# Patient Record
Sex: Male | Born: 1965 | Race: Black or African American | Hispanic: No | Smoking: Former smoker
Health system: Southern US, Community
[De-identification: ages and names within clinical notes are randomized; demographics above are authoritative.]

## PROBLEM LIST (undated history)

## (undated) DIAGNOSIS — I499 Cardiac arrhythmia, unspecified: Secondary | ICD-10-CM

## (undated) DIAGNOSIS — L709 Acne, unspecified: Secondary | ICD-10-CM

## (undated) DIAGNOSIS — H5440 Blindness, one eye, unspecified eye: Secondary | ICD-10-CM

## (undated) DIAGNOSIS — I209 Angina pectoris, unspecified: Secondary | ICD-10-CM

## (undated) DIAGNOSIS — F410 Panic disorder [episodic paroxysmal anxiety] without agoraphobia: Secondary | ICD-10-CM

## (undated) DIAGNOSIS — R634 Abnormal weight loss: Secondary | ICD-10-CM

## (undated) DIAGNOSIS — K219 Gastro-esophageal reflux disease without esophagitis: Secondary | ICD-10-CM

## (undated) DIAGNOSIS — K649 Unspecified hemorrhoids: Secondary | ICD-10-CM

## (undated) DIAGNOSIS — G255 Other chorea: Secondary | ICD-10-CM

## (undated) DIAGNOSIS — N529 Male erectile dysfunction, unspecified: Secondary | ICD-10-CM

## (undated) DIAGNOSIS — K5790 Diverticulosis of intestine, part unspecified, without perforation or abscess without bleeding: Secondary | ICD-10-CM

## (undated) DIAGNOSIS — I10 Essential (primary) hypertension: Secondary | ICD-10-CM

## (undated) DIAGNOSIS — G10 Huntington's disease: Secondary | ICD-10-CM

## (undated) HISTORY — DX: Gastro-esophageal reflux disease without esophagitis: K21.9

## (undated) HISTORY — DX: Acne, unspecified: L70.9

## (undated) HISTORY — PX: COLONOSCOPY: SHX174

## (undated) HISTORY — DX: Other chorea: G25.5

## (undated) HISTORY — DX: Unspecified hemorrhoids: K64.9

## (undated) HISTORY — DX: Diverticulosis of intestine, part unspecified, without perforation or abscess without bleeding: K57.90

## (undated) HISTORY — PX: CARDIAC SURGERY: SHX584

## (undated) HISTORY — PX: HEMORRHOID SURGERY: SHX153

## (undated) HISTORY — DX: Blindness, one eye, unspecified eye: H54.40

## (undated) HISTORY — DX: Abnormal weight loss: R63.4

## (undated) HISTORY — DX: Male erectile dysfunction, unspecified: N52.9

---

## 1989-11-04 HISTORY — PX: ROTATOR CUFF REPAIR: SHX139

## 2000-11-04 ENCOUNTER — Encounter: Payer: Self-pay | Admitting: Emergency Medicine

## 2000-11-04 ENCOUNTER — Emergency Department (HOSPITAL_COMMUNITY): Admission: EM | Admit: 2000-11-04 | Discharge: 2000-11-04 | Payer: Self-pay | Admitting: Emergency Medicine

## 2001-10-23 ENCOUNTER — Emergency Department (HOSPITAL_COMMUNITY): Admission: EM | Admit: 2001-10-23 | Discharge: 2001-10-23 | Payer: Self-pay | Admitting: Emergency Medicine

## 2001-11-06 ENCOUNTER — Emergency Department (HOSPITAL_COMMUNITY): Admission: EM | Admit: 2001-11-06 | Discharge: 2001-11-06 | Payer: Self-pay | Admitting: *Deleted

## 2002-04-27 ENCOUNTER — Emergency Department (HOSPITAL_COMMUNITY): Admission: EM | Admit: 2002-04-27 | Discharge: 2002-04-27 | Payer: Self-pay | Admitting: Emergency Medicine

## 2002-04-28 ENCOUNTER — Emergency Department (HOSPITAL_COMMUNITY): Admission: EM | Admit: 2002-04-28 | Discharge: 2002-04-28 | Payer: Self-pay | Admitting: Emergency Medicine

## 2002-04-30 ENCOUNTER — Emergency Department (HOSPITAL_COMMUNITY): Admission: EM | Admit: 2002-04-30 | Discharge: 2002-04-30 | Payer: Self-pay

## 2002-08-20 ENCOUNTER — Inpatient Hospital Stay (HOSPITAL_COMMUNITY): Admission: EM | Admit: 2002-08-20 | Discharge: 2002-08-27 | Payer: Self-pay | Admitting: Psychiatry

## 2002-08-30 ENCOUNTER — Other Ambulatory Visit (HOSPITAL_COMMUNITY): Admission: RE | Admit: 2002-08-30 | Discharge: 2002-08-31 | Payer: Self-pay | Admitting: Psychiatry

## 2002-09-03 ENCOUNTER — Emergency Department (HOSPITAL_COMMUNITY): Admission: EM | Admit: 2002-09-03 | Discharge: 2002-09-03 | Payer: Self-pay | Admitting: Emergency Medicine

## 2002-11-10 ENCOUNTER — Ambulatory Visit (HOSPITAL_COMMUNITY): Admission: RE | Admit: 2002-11-10 | Discharge: 2002-11-10 | Payer: Self-pay | Admitting: *Deleted

## 2003-02-28 ENCOUNTER — Emergency Department (HOSPITAL_COMMUNITY): Admission: EM | Admit: 2003-02-28 | Discharge: 2003-02-28 | Payer: Self-pay | Admitting: Emergency Medicine

## 2004-09-11 ENCOUNTER — Emergency Department: Payer: Self-pay | Admitting: Emergency Medicine

## 2004-09-12 ENCOUNTER — Emergency Department: Payer: Self-pay | Admitting: Emergency Medicine

## 2004-12-06 ENCOUNTER — Emergency Department: Payer: Self-pay | Admitting: Emergency Medicine

## 2005-01-29 ENCOUNTER — Emergency Department: Payer: Self-pay | Admitting: Emergency Medicine

## 2005-03-11 ENCOUNTER — Ambulatory Visit: Payer: Self-pay | Admitting: Gastroenterology

## 2005-05-10 ENCOUNTER — Ambulatory Visit: Payer: Self-pay | Admitting: Gastroenterology

## 2005-10-27 ENCOUNTER — Inpatient Hospital Stay: Payer: Self-pay | Admitting: Internal Medicine

## 2005-11-22 ENCOUNTER — Emergency Department: Payer: Self-pay | Admitting: Internal Medicine

## 2006-07-08 ENCOUNTER — Emergency Department: Payer: Self-pay | Admitting: Emergency Medicine

## 2006-07-27 ENCOUNTER — Emergency Department: Payer: Self-pay | Admitting: Emergency Medicine

## 2007-01-19 ENCOUNTER — Emergency Department: Payer: Self-pay | Admitting: Emergency Medicine

## 2007-09-09 ENCOUNTER — Ambulatory Visit: Payer: Self-pay | Admitting: Specialist

## 2007-10-24 ENCOUNTER — Emergency Department: Payer: Self-pay | Admitting: Unknown Physician Specialty

## 2008-01-06 ENCOUNTER — Observation Stay: Payer: Self-pay | Admitting: Internal Medicine

## 2008-01-06 ENCOUNTER — Other Ambulatory Visit: Payer: Self-pay

## 2008-07-03 ENCOUNTER — Emergency Department: Payer: Self-pay | Admitting: Internal Medicine

## 2008-07-20 ENCOUNTER — Emergency Department: Payer: Self-pay | Admitting: Emergency Medicine

## 2008-09-26 ENCOUNTER — Inpatient Hospital Stay: Payer: Self-pay | Admitting: Psychiatry

## 2008-12-01 ENCOUNTER — Emergency Department: Payer: Self-pay | Admitting: Emergency Medicine

## 2010-01-04 ENCOUNTER — Emergency Department: Payer: Self-pay | Admitting: Emergency Medicine

## 2010-01-08 ENCOUNTER — Emergency Department: Payer: Self-pay | Admitting: Emergency Medicine

## 2010-03-16 ENCOUNTER — Emergency Department: Payer: Self-pay | Admitting: Internal Medicine

## 2010-05-24 ENCOUNTER — Emergency Department: Payer: Self-pay | Admitting: Emergency Medicine

## 2010-06-30 ENCOUNTER — Emergency Department: Payer: Self-pay | Admitting: Emergency Medicine

## 2011-04-26 ENCOUNTER — Emergency Department: Payer: Self-pay | Admitting: Emergency Medicine

## 2011-11-24 ENCOUNTER — Emergency Department: Payer: Self-pay | Admitting: *Deleted

## 2012-02-02 ENCOUNTER — Emergency Department: Payer: Self-pay | Admitting: Emergency Medicine

## 2012-02-04 ENCOUNTER — Inpatient Hospital Stay: Payer: Self-pay | Admitting: Surgery

## 2012-02-04 LAB — COMPREHENSIVE METABOLIC PANEL
Anion Gap: 9 (ref 7–16)
Calcium, Total: 9.2 mg/dL (ref 8.5–10.1)
Chloride: 106 mmol/L (ref 98–107)
Co2: 25 mmol/L (ref 21–32)
Creatinine: 0.98 mg/dL (ref 0.60–1.30)
EGFR (African American): >60
Potassium: 4.3 mmol/L (ref 3.5–5.1)
Sodium: 140 mmol/L (ref 136–145)
Total Protein: 8.7 g/dL — ABNORMAL HIGH (ref 6.4–8.2)

## 2012-02-04 LAB — CBC
HCT: 45.1 % (ref 40.0–52.0)
HGB: 15.1 g/dL (ref 13.0–18.0)
MCHC: 33.6 g/dL (ref 32.0–36.0)
MCV: 85 fL (ref 80–100)
Platelet: 225 10*3/uL (ref 150–440)
RBC: 5.32 10*6/uL (ref 4.40–5.90)
RDW: 15.8 % — ABNORMAL HIGH (ref 11.5–14.5)

## 2012-02-05 LAB — CBC WITH DIFFERENTIAL/PLATELET
Basophil #: 0 10*3/uL (ref 0.0–0.1)
Basophil %: 0.3 %
Eosinophil #: 0.5 10*3/uL (ref 0.0–0.7)
MCH: 28.2 pg (ref 26.0–34.0)
MCHC: 33.5 g/dL (ref 32.0–36.0)
MCV: 84 fL (ref 80–100)
Neutrophil %: 64.9 %
Platelet: 229 10*3/uL (ref 150–440)
RBC: 5.47 10*6/uL (ref 4.40–5.90)
RDW: 15.3 % — ABNORMAL HIGH (ref 11.5–14.5)

## 2012-02-06 LAB — CLOSTRIDIUM DIFFICILE BY PCR

## 2012-02-07 LAB — PATHOLOGY REPORT

## 2012-02-08 LAB — STOOL CULTURE

## 2012-05-20 ENCOUNTER — Ambulatory Visit: Payer: Self-pay

## 2012-06-08 ENCOUNTER — Ambulatory Visit (INDEPENDENT_AMBULATORY_CARE_PROVIDER_SITE_OTHER): Payer: Self-pay

## 2012-06-08 DIAGNOSIS — K589 Irritable bowel syndrome without diarrhea: Secondary | ICD-10-CM

## 2012-07-04 ENCOUNTER — Emergency Department: Payer: Self-pay | Admitting: Emergency Medicine

## 2012-09-29 ENCOUNTER — Emergency Department: Payer: Self-pay | Admitting: Unknown Physician Specialty

## 2012-10-31 ENCOUNTER — Emergency Department: Payer: Self-pay | Admitting: Unknown Physician Specialty

## 2012-10-31 LAB — COMPREHENSIVE METABOLIC PANEL
Albumin: 3.5 g/dL (ref 3.4–5.0)
Alkaline Phosphatase: 59 U/L (ref 50–136)
Anion Gap: 5 — ABNORMAL LOW (ref 7–16)
BUN: 7 mg/dL (ref 7–18)
Bilirubin,Total: 0.6 mg/dL (ref 0.2–1.0)
Creatinine: 1 mg/dL (ref 0.60–1.30)
Glucose: 131 mg/dL — ABNORMAL HIGH (ref 65–99)
Osmolality: 279 (ref 275–301)
Potassium: 3.9 mmol/L (ref 3.5–5.1)
Sodium: 140 mmol/L (ref 136–145)
Total Protein: 7.7 g/dL (ref 6.4–8.2)

## 2012-10-31 LAB — URINALYSIS, COMPLETE
Bilirubin,UR: NEGATIVE
Ketone: NEGATIVE
Nitrite: NEGATIVE
Ph: 6 (ref 4.5–8.0)
Protein: NEGATIVE
Specific Gravity: 1.01 (ref 1.003–1.030)
Squamous Epithelial: NONE SEEN
WBC UR: NONE SEEN /HPF (ref 0–5)

## 2012-10-31 LAB — CBC
HCT: 42.3 % (ref 40.0–52.0)
MCHC: 34.1 g/dL (ref 32.0–36.0)
Platelet: 193 10*3/uL (ref 150–440)
RBC: 4.92 10*6/uL (ref 4.40–5.90)
RDW: 14.9 % — ABNORMAL HIGH (ref 11.5–14.5)
WBC: 4.3 10*3/uL (ref 3.8–10.6)

## 2012-11-27 ENCOUNTER — Emergency Department: Payer: Self-pay | Admitting: Emergency Medicine

## 2012-11-27 LAB — URINALYSIS, COMPLETE
Bilirubin,UR: NEGATIVE
Blood: NEGATIVE
Leukocyte Esterase: NEGATIVE
Ph: 5 (ref 4.5–8.0)
RBC,UR: 1 /HPF (ref 0–5)
Specific Gravity: 1.016 (ref 1.003–1.030)
Squamous Epithelial: <1

## 2012-11-27 LAB — COMPREHENSIVE METABOLIC PANEL
Albumin: 3.8 g/dL (ref 3.4–5.0)
Alkaline Phosphatase: 67 U/L (ref 50–136)
Anion Gap: 3 — ABNORMAL LOW (ref 7–16)
Calcium, Total: 9 mg/dL (ref 8.5–10.1)
Chloride: 109 mmol/L — ABNORMAL HIGH (ref 98–107)
EGFR (African American): >60
Glucose: 61 mg/dL — ABNORMAL LOW (ref 65–99)
Osmolality: 277 (ref 275–301)
Potassium: 4 mmol/L (ref 3.5–5.1)
SGOT(AST): 25 U/L (ref 15–37)
Sodium: 141 mmol/L (ref 136–145)

## 2012-11-27 LAB — CBC
HCT: 45.4 % (ref 40.0–52.0)
HGB: 15.4 g/dL (ref 13.0–18.0)
RDW: 14.8 % — ABNORMAL HIGH (ref 11.5–14.5)
WBC: 5.6 10*3/uL (ref 3.8–10.6)

## 2013-01-17 ENCOUNTER — Emergency Department: Payer: Self-pay | Admitting: Unknown Physician Specialty

## 2013-01-17 LAB — COMPREHENSIVE METABOLIC PANEL
Albumin: 3.7 g/dL (ref 3.4–5.0)
BUN: 8 mg/dL (ref 7–18)
Bilirubin,Total: 0.7 mg/dL (ref 0.2–1.0)
Calcium, Total: 8.7 mg/dL (ref 8.5–10.1)
Creatinine: 0.76 mg/dL (ref 0.60–1.30)
EGFR (African American): >60
Glucose: 124 mg/dL — ABNORMAL HIGH (ref 65–99)
SGOT(AST): 21 U/L (ref 15–37)
SGPT (ALT): 31 U/L (ref 12–78)
Sodium: 137 mmol/L (ref 136–145)
Total Protein: 8.4 g/dL — ABNORMAL HIGH (ref 6.4–8.2)

## 2013-01-17 LAB — URINALYSIS, COMPLETE
Bilirubin,UR: NEGATIVE
Blood: NEGATIVE
Ketone: NEGATIVE
Leukocyte Esterase: NEGATIVE
Protein: NEGATIVE
Specific Gravity: 1.006 (ref 1.003–1.030)
Squamous Epithelial: <1

## 2013-01-17 LAB — CBC
HCT: 44.6 % (ref 40.0–52.0)
HGB: 15.1 g/dL (ref 13.0–18.0)
MCHC: 33.9 g/dL (ref 32.0–36.0)
Platelet: 245 10*3/uL (ref 150–440)
RDW: 14.6 % — ABNORMAL HIGH (ref 11.5–14.5)

## 2013-03-18 ENCOUNTER — Emergency Department: Payer: Self-pay | Admitting: Emergency Medicine

## 2013-04-09 ENCOUNTER — Emergency Department: Payer: Self-pay | Admitting: Emergency Medicine

## 2013-12-10 ENCOUNTER — Emergency Department: Payer: Self-pay | Admitting: Emergency Medicine

## 2013-12-10 LAB — CBC WITH DIFFERENTIAL/PLATELET
BASOS ABS: 0.1 10*3/uL (ref 0.0–0.1)
BASOS PCT: 0.9 %
Eosinophil #: 0.4 10*3/uL (ref 0.0–0.7)
Eosinophil %: 4.4 %
HCT: 41.7 % (ref 40.0–52.0)
HGB: 14.5 g/dL (ref 13.0–18.0)
LYMPHS ABS: 1.4 10*3/uL (ref 1.0–3.6)
LYMPHS PCT: 16.9 %
MCH: 29.9 pg (ref 26.0–34.0)
MCHC: 34.7 g/dL (ref 32.0–36.0)
MCV: 86 fL (ref 80–100)
MONO ABS: 0.7 x10 3/mm (ref 0.2–1.0)
MONOS PCT: 9 %
NEUTROS PCT: 68.8 %
Neutrophil #: 5.7 10*3/uL (ref 1.4–6.5)
PLATELETS: 226 10*3/uL (ref 150–440)
RBC: 4.83 10*6/uL (ref 4.40–5.90)
RDW: 14 % (ref 11.5–14.5)
WBC: 8.3 10*3/uL (ref 3.8–10.6)

## 2013-12-11 LAB — BASIC METABOLIC PANEL
ANION GAP: 6 — AB (ref 7–16)
BUN: 11 mg/dL (ref 7–18)
Calcium, Total: 9.1 mg/dL (ref 8.5–10.1)
Chloride: 107 mmol/L (ref 98–107)
Co2: 27 mmol/L (ref 21–32)
Creatinine: 0.86 mg/dL (ref 0.60–1.30)
EGFR (African American): >60
EGFR (Non-African Amer.): >60
GLUCOSE: 88 mg/dL (ref 65–99)
Osmolality: 278 (ref 275–301)
Potassium: 3.6 mmol/L (ref 3.5–5.1)
Sodium: 140 mmol/L (ref 136–145)

## 2013-12-15 LAB — WOUND CULTURE

## 2014-01-25 ENCOUNTER — Emergency Department: Payer: Self-pay | Admitting: Emergency Medicine

## 2014-02-17 ENCOUNTER — Emergency Department: Payer: Self-pay | Admitting: Emergency Medicine

## 2014-03-16 DIAGNOSIS — K649 Unspecified hemorrhoids: Secondary | ICD-10-CM | POA: Insufficient documentation

## 2014-03-21 ENCOUNTER — Ambulatory Visit: Payer: Self-pay | Admitting: Internal Medicine

## 2014-05-05 ENCOUNTER — Ambulatory Visit: Payer: Self-pay | Admitting: Surgery

## 2014-05-05 LAB — CBC WITH DIFFERENTIAL/PLATELET
Basophil #: 0 10*3/uL (ref 0.0–0.1)
Basophil %: 0.9 %
EOS PCT: 2.9 %
Eosinophil #: 0.2 10*3/uL (ref 0.0–0.7)
HCT: 43.8 % (ref 40.0–52.0)
HGB: 14.9 g/dL (ref 13.0–18.0)
LYMPHS ABS: 1.3 10*3/uL (ref 1.0–3.6)
LYMPHS PCT: 22.8 %
MCH: 30 pg (ref 26.0–34.0)
MCHC: 34.1 g/dL (ref 32.0–36.0)
MCV: 88 fL (ref 80–100)
Monocyte #: 0.5 x10 3/mm (ref 0.2–1.0)
Monocyte %: 8.9 %
Neutrophil #: 3.7 10*3/uL (ref 1.4–6.5)
Neutrophil %: 64.5 %
Platelet: 224 10*3/uL (ref 150–440)
RBC: 4.98 10*6/uL (ref 4.40–5.90)
RDW: 14.1 % (ref 11.5–14.5)
WBC: 5.8 10*3/uL (ref 3.8–10.6)

## 2014-05-05 LAB — DRUG SCREEN, URINE
AMPHETAMINES, UR SCREEN: NEGATIVE (ref ?–1000)
BARBITURATES, UR SCREEN: NEGATIVE (ref ?–200)
Benzodiazepine, Ur Scrn: NEGATIVE (ref ?–200)
Cannabinoid 50 Ng, Ur ~~LOC~~: NEGATIVE (ref ?–50)
Cocaine Metabolite,Ur ~~LOC~~: NEGATIVE (ref ?–300)
MDMA (ECSTASY) UR SCREEN: NEGATIVE (ref ?–500)
METHADONE, UR SCREEN: NEGATIVE (ref ?–300)
Opiate, Ur Screen: NEGATIVE (ref ?–300)
Phencyclidine (PCP) Ur S: NEGATIVE (ref ?–25)
TRICYCLIC, UR SCREEN: NEGATIVE (ref ?–1000)

## 2014-05-05 LAB — BASIC METABOLIC PANEL
Anion Gap: 6 — ABNORMAL LOW (ref 7–16)
BUN: 8 mg/dL (ref 7–18)
CHLORIDE: 105 mmol/L (ref 98–107)
Calcium, Total: 9.4 mg/dL (ref 8.5–10.1)
Co2: 25 mmol/L (ref 21–32)
Creatinine: 0.92 mg/dL (ref 0.60–1.30)
EGFR (African American): >60
Glucose: 83 mg/dL (ref 65–99)
Osmolality: 269 (ref 275–301)
Potassium: 3.6 mmol/L (ref 3.5–5.1)
SODIUM: 136 mmol/L (ref 136–145)

## 2014-05-09 ENCOUNTER — Ambulatory Visit: Payer: Self-pay | Admitting: Surgery

## 2014-05-11 LAB — PATHOLOGY REPORT

## 2014-10-05 ENCOUNTER — Encounter (HOSPITAL_COMMUNITY): Payer: Self-pay | Admitting: Emergency Medicine

## 2014-10-05 ENCOUNTER — Emergency Department (HOSPITAL_COMMUNITY)
Admission: EM | Admit: 2014-10-05 | Discharge: 2014-10-05 | Disposition: A | Discharge status: Home / Self Care | Payer: Self-pay | Attending: Emergency Medicine | Admitting: Emergency Medicine

## 2014-10-05 DIAGNOSIS — R195 Other fecal abnormalities: Secondary | ICD-10-CM | POA: Insufficient documentation

## 2014-10-05 DIAGNOSIS — Z792 Long term (current) use of antibiotics: Secondary | ICD-10-CM | POA: Insufficient documentation

## 2014-10-05 DIAGNOSIS — R197 Diarrhea, unspecified: Secondary | ICD-10-CM | POA: Insufficient documentation

## 2014-10-05 DIAGNOSIS — Z9889 Other specified postprocedural states: Secondary | ICD-10-CM | POA: Insufficient documentation

## 2014-10-05 DIAGNOSIS — Z8719 Personal history of other diseases of the digestive system: Secondary | ICD-10-CM | POA: Insufficient documentation

## 2014-10-05 DIAGNOSIS — Z720 Tobacco use: Secondary | ICD-10-CM | POA: Insufficient documentation

## 2014-10-05 DIAGNOSIS — R1084 Generalized abdominal pain: Secondary | ICD-10-CM | POA: Insufficient documentation

## 2014-10-05 LAB — CBC WITH DIFFERENTIAL/PLATELET
Basophils Absolute: 0 10*3/uL (ref 0.0–0.1)
Basophils Relative: 0 % (ref 0–1)
Eosinophils Absolute: 0.2 10*3/uL (ref 0.0–0.7)
Eosinophils Relative: 3 % (ref 0–5)
HCT: 44.3 % (ref 39.0–52.0)
Hemoglobin: 14.9 g/dL (ref 13.0–17.0)
Lymphocytes Relative: 30 % (ref 12–46)
Lymphs Abs: 1.7 10*3/uL (ref 0.7–4.0)
MCH: 28.9 pg (ref 26.0–34.0)
MCHC: 33.6 g/dL (ref 30.0–36.0)
MCV: 86 fL (ref 78.0–100.0)
Monocytes Absolute: 0.4 10*3/uL (ref 0.1–1.0)
Monocytes Relative: 7 % (ref 3–12)
Neutro Abs: 3.5 10*3/uL (ref 1.7–7.7)
Neutrophils Relative %: 60 % (ref 43–77)
Platelets: 216 10*3/uL (ref 150–400)
RBC: 5.15 MIL/uL (ref 4.22–5.81)
RDW: 13.7 % (ref 11.5–15.5)
WBC: 5.8 10*3/uL (ref 4.0–10.5)

## 2014-10-05 LAB — COMPREHENSIVE METABOLIC PANEL
ALT: 20 U/L (ref 0–53)
AST: 17 U/L (ref 0–37)
Albumin: 3.8 g/dL (ref 3.5–5.2)
Alkaline Phosphatase: 49 U/L (ref 39–117)
Anion gap: 15 (ref 5–15)
BUN: 7 mg/dL (ref 6–23)
CO2: 22 mEq/L (ref 19–32)
Calcium: 9.5 mg/dL (ref 8.4–10.5)
Chloride: 104 mEq/L (ref 96–112)
Creatinine, Ser: 0.82 mg/dL (ref 0.50–1.35)
GFR calc Af Amer: >90 mL/min (ref >90)
GFR calc non Af Amer: >90 mL/min (ref >90)
Glucose, Bld: 93 mg/dL (ref 70–99)
Potassium: 3.9 mEq/L (ref 3.7–5.3)
Sodium: 141 mEq/L (ref 137–147)
Total Bilirubin: 0.2 mg/dL — ABNORMAL LOW (ref 0.3–1.2)
Total Protein: 7.7 g/dL (ref 6.0–8.3)

## 2014-10-05 LAB — LIPASE, BLOOD: Lipase: 36 U/L (ref 11–59)

## 2014-10-05 LAB — URINALYSIS, ROUTINE W REFLEX MICROSCOPIC
Bilirubin Urine: NEGATIVE
Glucose, UA: NEGATIVE mg/dL
HGB URINE DIPSTICK: NEGATIVE
Ketones, ur: NEGATIVE mg/dL
LEUKOCYTES UA: NEGATIVE
NITRITE: NEGATIVE
PROTEIN: NEGATIVE mg/dL
SPECIFIC GRAVITY, URINE: 1.01 (ref 1.005–1.030)
UROBILINOGEN UA: 0.2 mg/dL (ref 0.0–1.0)
pH: 6.5 (ref 5.0–8.0)

## 2014-10-05 LAB — POC OCCULT BLOOD, ED: FECAL OCCULT BLD: NEGATIVE

## 2014-10-05 MED ORDER — DICYCLOMINE HCL 10 MG PO CAPS
10.0000 mg | ORAL_CAPSULE | Freq: Once | ORAL | Status: AC
  Administered 2014-10-05: 10 mg via ORAL
  Filled 2014-10-05 (×2): qty 1

## 2014-10-05 MED ORDER — DICYCLOMINE HCL 20 MG PO TABS: 20.0000 mg | ORAL_TABLET | Freq: Two times a day (BID) | ORAL | Status: DC

## 2014-10-05 MED ORDER — ONDANSETRON 8 MG PO TBDP
8.0000 mg | ORAL_TABLET | Freq: Once | ORAL | Status: AC
  Administered 2014-10-05: 8 mg via ORAL
  Filled 2014-10-05: qty 1

## 2014-10-05 MED ORDER — METRONIDAZOLE 500 MG PO TABS: 500.0000 mg | ORAL_TABLET | Freq: Three times a day (TID) | ORAL | Status: DC

## 2014-10-05 NOTE — ED Notes (Signed)
Pt unable to provide a stool sample. PA at bedside and made aware.

## 2014-10-05 NOTE — ED Notes (Signed)
PA at bedside.

## 2014-10-05 NOTE — ED Provider Notes (Signed)
CSN: [id number]     Arrival date & time 10/05/14  1921 History   First MD Initiated Contact with Patient 10/05/14 2044     Chief Complaint  Patient presents with  . Abdominal Pain  . Diarrhea  . Nausea     (Consider location/radiation/quality/duration/timing/severity/associated sxs/prior Treatment) HPI Jeffrey Prince is a 48 y.o. male history of hemorrhoids presents to emergency department complaining of abdominal pain and diarrhea. Patient states he has had a bowel pain and diarrhea for over a week. He reports symptoms began after starting on Cipro, states he only took several days worth of Cipro for a cyst to the back of his head. States he stopped Cipro and diarrhea started after reading about adverse effects, but states that his diarrhea did not improve. States abdominal pain is diffuse, it's not worsened with eating. States having 3-4 bowel movements every day, watery. Some bright red blood in his stool at times. But reports has history of hemorrhoids. He denies any fever or chills. No nausea or vomiting. States pain is not worsening, but it is not improving. No recent travel outside the country. No contact with anyone with similar symptoms. Taking Imodium with no relief  History reviewed. No pertinent past medical history. Past Surgical History  Procedure Laterality Date  . Colonoscopy      with polyp removal  . Rotator cuff repair    . Hemorrhoid surgery     No family history on file. History  Substance Use Topics  . Smoking status: Current Every Day Smoker  . Smokeless tobacco: Not on file  . Alcohol Use: No    Review of Systems  Constitutional: Negative for fever and chills.  Respiratory: Negative for cough, chest tightness and shortness of breath.   Cardiovascular: Negative for chest pain, palpitations and leg swelling.  Gastrointestinal: Positive for abdominal pain, diarrhea and blood in stool. Negative for nausea, vomiting and abdominal distention.  Genitourinary:  Negative for dysuria, urgency, frequency and hematuria.  Musculoskeletal: Negative for myalgias, arthralgias, neck pain and neck stiffness.  Skin: Negative for rash.  Allergic/Immunologic: Negative for immunocompromised state.  Neurological: Negative for dizziness, weakness, light-headedness, numbness and headaches.  All other systems reviewed and are negative.      Allergies  Codeine  Home Medications   Prior to Admission medications   Medication Sig Start Date End Date Taking? Authorizing Provider  acetaminophen (TYLENOL) 325 MG tablet Take 650 mg by mouth every 6 (six) hours as needed for moderate pain (pain).   Yes Historical Provider, MD  ciprofloxacin (CIPRO) 500 MG tablet Take 500 mg by mouth 2 (two) times daily.   Yes Historical Provider, MD  Specialty Vitamins Products (PROSTATE PO) Take 1 capsule by mouth every evening.   Yes Historical Provider, MD   BP 130/83 mmHg  Pulse 57  Temp(Src) 97.6 F (36.4 C) (Oral)  Resp 16  Ht 5\' 10"  (1.778 m)  Wt 170 lb (77.111 kg)  BMI 24.39 kg/m2  SpO2 100% Physical Exam  Constitutional: He appears well-developed and well-nourished. No distress.  HENT:  Head: Normocephalic and atraumatic.  Eyes: Conjunctivae are normal.  Neck: Neck supple.  Cardiovascular: Normal rate, regular rhythm and normal heart sounds.   Pulmonary/Chest: Effort normal. No respiratory distress. He has no wheezes. He has no rales.  Abdominal: Soft. Bowel sounds are normal. He exhibits no distension. There is tenderness. There is no rebound and no guarding.  Diffuse tenderness.  Musculoskeletal: He exhibits no edema.  Neurological: He is alert.  Skin: Skin is warm and dry.  Nursing note and vitals reviewed.   ED Course  Procedures (including critical care time) Labs Review Labs Reviewed  COMPREHENSIVE METABOLIC PANEL - Abnormal; Notable for the following:    Total Bilirubin 0.2 (*)    All other components within normal limits  STOOL CULTURE   CLOSTRIDIUM DIFFICILE BY PCR  CBC WITH DIFFERENTIAL  LIPASE, BLOOD  URINALYSIS, ROUTINE W REFLEX MICROSCOPIC  POC OCCULT BLOOD, ED    Imaging Review No results found.   EKG Interpretation None      MDM   Final diagnoses:  Diarrhea  Generalized abdominal pain    patient with diffuse abdominal pain, diarrhea for almost 2 weeks. Symptoms began after taking Cipro. Will send for C. difficile colitis versus infectious diarrhea. Blood work ordered.   10:22 PM Patient's labs all normal. Hemoccult and urinalysis pending.  Patient unable to give a stool sample. Will discharge home with the sterile cup, will place outpatient orders for stool cultures and C. difficile. We'll start her Flagyl, strong suspicion for colitis. Will start on Bentyl for pain. Follow-up with primary care doctor. Patient is otherwise nontoxic appearing. Abdomen is benign. Vital signs are normal  Filed Vitals:   10/05/14 1939 10/05/14 1942 10/05/14 2134 10/05/14 2341  BP: 138/85  130/83 134/80  Pulse: 69  57 59  Temp: 97.6 F (36.4 C)     TempSrc: Oral     Resp: 18  16 16   Height:  5\' 10"  (1.778 m)    Weight:  170 lb (77.111 kg)    SpO2: 100%  100% 100%      Renold Genta, PA-C 10/06/14 0004  Charlesetta Shanks, MD 10/06/14 1904

## 2014-10-05 NOTE — ED Notes (Signed)
Pt alert, oriented, and ambulatory upon DC. PT and male visitor report they are leaving with ALL belongings they arrived with. Pt was advised to follow up with community health and wellness center.

## 2014-10-05 NOTE — Discharge Instructions (Signed)
Bentyl for abdominal pain. Flagyl for possible infection. Take until all gone. Follow up with a primary care doctor. Try wellness center or see print out below.   Diarrhea Diarrhea is frequent loose and watery bowel movements. It can cause you to feel weak and dehydrated. Dehydration can cause you to become tired and thirsty, have a dry mouth, and have decreased urination that often is dark yellow. Diarrhea is a sign of another problem, most often an infection that will not last long. In most cases, diarrhea typically lasts 2-3 days. However, it can last longer if it is a sign of something more serious. It is important to treat your diarrhea as directed by your caregiver to lessen or prevent future episodes of diarrhea. CAUSES  Some common causes include:  Gastrointestinal infections caused by viruses, bacteria, or parasites.  Food poisoning or food allergies.  Certain medicines, such as antibiotics, chemotherapy, and laxatives.  Artificial sweeteners and fructose.  Digestive disorders. HOME CARE INSTRUCTIONS  Ensure adequate fluid intake (hydration): Have 1 cup (8 oz) of fluid for each diarrhea episode. Avoid fluids that contain simple sugars or sports drinks, fruit juices, whole milk products, and sodas. Your urine should be clear or pale yellow if you are drinking enough fluids. Hydrate with an oral rehydration solution that you can purchase at pharmacies, retail stores, and online. You can prepare an oral rehydration solution at home by mixing the following ingredients together:   - tsp table salt.   tsp baking soda.   tsp salt substitute containing potassium chloride.  1  tablespoons sugar.  1 L (34 oz) of water.  Certain foods and beverages may increase the speed at which food moves through the gastrointestinal (GI) tract. These foods and beverages should be avoided and include:  Caffeinated and alcoholic beverages.  High-fiber foods, such as raw fruits and vegetables, nuts,  seeds, and whole grain breads and cereals.  Foods and beverages sweetened with sugar alcohols, such as xylitol, sorbitol, and mannitol.  Some foods may be well tolerated and may help thicken stool including:  Starchy foods, such as rice, toast, pasta, low-sugar cereal, oatmeal, grits, baked potatoes, crackers, and bagels.  Bananas.  Applesauce.  Add probiotic-rich foods to help increase healthy bacteria in the GI tract, such as yogurt and fermented milk products.  Wash your hands well after each diarrhea episode.  Only take over-the-counter or prescription medicines as directed by your caregiver.  Take a warm bath to relieve any burning or pain from frequent diarrhea episodes. SEEK IMMEDIATE MEDICAL CARE IF:   You are unable to keep fluids down.  You have persistent vomiting.  You have blood in your stool, or your stools are black and tarry.  You do not urinate in 6-8 hours, or there is only a small amount of very dark urine.  You have abdominal pain that increases or localizes.  You have weakness, dizziness, confusion, or light-headedness.  You have a severe headache.  Your diarrhea gets worse or does not get better.  You have a fever or persistent symptoms for more than 2-3 days.  You have a fever and your symptoms suddenly get worse. MAKE SURE YOU:   Understand these instructions.  Will watch your condition.  Will get help right away if you are not doing well or get worse. Document Released: 10/11/2002 Document Revised: 03/07/2014 Document Reviewed: 06/28/2012 [facility] Patient Information 2015 [employer], [place]. This information is not intended to replace advice given to you by your health care provider. Make  sure you discuss any questions you have with your health care provider.   Emergency Department Resource Guide 1) Find a Doctor and Pay Out of Pocket Although you won't have to find out who is covered by your insurance plan, it is a good idea to ask around  and get recommendations. You will then need to call the office and see if the doctor you have chosen will accept you as a new patient and what types of options they offer for patients who are self-pay. Some doctors offer discounts or will set up payment plans for their patients who do not have insurance, but you will need to ask so you aren't surprised when you get to your appointment.  2) Contact Your Local Health Department Not all health departments have doctors that can see patients for sick visits, but many do, so it is worth a call to see if yours does. If you don't know where your local health department is, you can check in your phone book. The CDC also has a tool to help you locate your state's health department, and many state websites also have listings of all of their local health departments.  3) Find a [facility] If your illness is not likely to be very severe or complicated, you may want to try a walk in clinic. These are popping up all over the country in pharmacies, drugstores, and shopping centers. They're usually staffed by nurse practitioners or physician assistants that have been trained to treat common illnesses and complaints. They're usually fairly quick and inexpensive. However, if you have serious medical issues or chronic medical problems, these are probably not your best option.  No Primary Care Doctor: - Call Health Connect at  [phone] - they can help you locate a primary care doctor that  accepts your insurance, provides certain services, etc. - Physician Referral Service- [phone]  Chronic Pain Problems: Organization         Address  Phone   Notes  [facility]  [phone] Patients need to be referred by their primary care doctor.   Medication Assistance: Organization         Address  Phone   Notes  [facility] Medication [facility] [address] [place], [place] [address] [phone] --Must be a resident  of [facility] -- Must have NO insurance coverage whatsoever (no Medicaid/ Medicare, etc.) -- The pt. MUST have a primary care doctor that directs their care regularly and follows them in the community   [facility]ist  [phone]   [employer]  [phone]    Agencies that provide inexpensive medical care: Organization         Address  Phone   Notes  [place]  [phone]   [facility]    [phone]   [facility] [place], [place] [address] [phone]   [place] [address] [place] [phone]   Planned Parenthood    [phone]   [facility]    [phone]   [place] [address], [place] Phone:  [phone], Fax:  [phone] Hours of Operation:  9 am - 6 pm, M-F.  Also accepts Medicaid/Medicare and self-pay.  [facility] for [place], [address], [place] Phone: [phone], Fax: [phone]. Hours of Operation:  8:30 am -  5:30 pm, M-F.  Also accepts Medicaid and self-pay.  [facility] [place] [address] [place] Phone: [phone]   [place], [place], [place] [phone], Ext. [phone] Mondays & Thursdays: 7-9 AM.  First 15 patients are seen on a first come, first serve basis.    [place] Providers:  Organization         Address  Phone   Notes  [facility] [address], [place] [phone] Also accepts self-pay patients.  [facility] [address], [place]  [phone]   [place], [address], [place] [phone]   [facility] [address] [place] [phone]   Lucianne Lei [address], [place]   [phone] Only accepts [place]  Access [place] patients after they have their name applied to their card.   Self-Pay (no insurance) in [facility]:  Organization         Address  Phone   Notes  Sickle Cell Patients, [facility] [place] [phone]   [facility] [place] [phone]   [facility] [place]  [place], [place], [place] [phone]   [facility]/Dr. Osei-Bonsu  [address] [place] or [address], [place] [phone] Phone number for both [place] and [place] locations is the same.  Urgent Medical and [facility] [address] [place] [phone]   [facility] [address], [place] or [address] [phone]   [facility] [address], [place] [phone], phone; [phone], fax Sees patients 1st and 3rd Saturday of every month.  Must not qualify for public or private insurance (i.e. Medicaid, Medicare, [place] Health Choice, Veterans' Benefits)  Household income should be no more than 200% of the poverty level The clinic cannot treat you if you are pregnant or think you are pregnant  Sexually transmitted diseases are not treated at the clinic.    Dental Care: Organization         Address  Phone  Notes  [facility] [place] [phone] Accepts children up to age 74 who are enrolled in [place] or [place]; pregnant women with a Medicaid card; and children who have applied for Medicaid or [place] Health Choice, but were declined, whose parents can pay a reduced fee at time of service.  [facility] Department of [facility]  [address], [place] [phone] Accepts children up to age 23 who are enrolled in [place] or [place]; pregnant women with a  Medicaid card; and children who have applied for Medicaid or [place] Health Choice, but were declined, whose parents can pay a reduced fee at time of service.  [place] Adult Dental Access PROGRAM  [place] [phone] Patients are seen by appointment only. Walk-ins are not accepted. [place] will see patients 35 years of age and older. Monday - Tuesday (8am-5pm) Most Wednesdays (8:30-5pm) $30 per visit, cash only  [facility] Adult Dental Access PROGRAM  [address], [place] [facility] [phone] Patients are seen by appointment only. Walk-ins are  not accepted. [place] will see patients 32 years of age and older. One Wednesday Evening (Monthly: Volunteer Based).  $30 per visit, cash only  [place]  [phone] for adults; Children under age 44, call Graduate Pediatric Dentistry at [phone]. Children aged 55-14, please call [phone] to request a pediatric application.  Dental services are provided in all areas of dental care including fillings, crowns and bridges, complete and partial dentures, implants, gum treatment, root canals, and extractions. Preventive care is also provided. Treatment is provided to both adults and children. Patients are selected via a lottery and there is often a waiting list.   [facility] [address] [place]  [phone] [email or web address]   [facility] [address] [place], [place] [phone], Ext. [phone] Second and Fourth Thursday of each month, opens at 6:30 AM; Clinic ends at 9 AM.  Patients are seen on a first-come first-served basis, and a limited number are seen during each clinic.   [facility]  [address] Hillard Danker [place], [place] [phone]   Eligibility Requirements You must have lived in [place], [place], or [place] counties for at least the last three months.   You cannot be eligible for state or federal sponsored [employer], including [employer], [place], or [employer].   You generally cannot be eligible for healthcare insurance through your employer.    How to apply: Eligibility screenings are held every Tuesday and Wednesday afternoon from 1:00 pm until 4:00 pm. You do not need an appointment for the interview!  [facility] [address], [place], [place]   [place]  [place] Department  [place]  [phone]    Behavioral Health Resources in the Community: Intensive Outpatient Programs Organization         Address  Phone  Notes  [place]. [address], [place], [place] [phone]   [facility] Outpatient [address] [place], [place]   ADS: Alcohol & Drug Svcs [address] [place], [place]   [place] [address]  [place], [place] or [phone]   Substance Abuse Resources Organization         Address  Phone  Notes  Alcohol and Drug Services  [phone]   [place]  [phone]   [place]   Chinita Pester  [phone]   Residential & Outpatient Substance Abuse Program  [phone]   Psychological Services Organization         Address  Phone  Notes  [facility] [place]  [place]  [phone]   [place] [address], [place] or [phone]    Mobile Crisis Teams Organization         Address  Phone  Notes  [facility], Mobile Crisis Care Unit  [phone]   [facility]otherapeutic Services  [address] [place], [place]   [place] Levels [address], [place] [phone]    Self-Help/Support Groups Organization         Address  Phone             Notes  [place]. of [place] - variety of support groups  [place] Call for more information  Narcotics Anonymous (NA),  [facility] [address], [employer] [place]  2 meetings at this location   Residential Facilities manager         Address  Phone  Notes  [facility] Residential Treatment [place],    [place]  [phone]   [facility]  [address], [place] [phone], [place], [place]   [place], [place] [phone] Admissions: 8am-3pm M-F  Incentives Substance [place] [address]    [place], [place] [phone]   [facility] [address] [place], [place], [place]   [facility] [address],  [place], [place]   [facility] Programs - Intensive Outpatient [address], [place], [place]   [facility] ([place] [address].,  [place], [place] [phone] or [phone]   [facility] Services (RTS) [address] [place], [place] Accepts Medicaid  [facility] [place] [address]  [place] [phone] Substance Abuse/Addiction Treatment   [facility] Organization         Address  Phone  Notes  [facility]  [phone]   Domenic Schwab, PhD [address] [place], [place]   [phone] or [phone]   [place] [address] [place], [place] [phone]   [facility] [address] [place], [place] [phone] Insurance/Medicaid/sponsorship through [place] [facility] [address], [address] [place]                                    [place], [place] [phone] [place] [address], [place] [phone]    Dr. Adele Schilder  [phone]   [facility] Dept. 1) [address]  [place] 2) [place] 3)  [place] [address], [place] [phone]  [phone]   [place] [phone] or [phone] (After Hours)

## 2014-10-05 NOTE — ED Notes (Signed)
Pt presents with c/o abdominal cramping with nausea and diarrhea x 4 days. Pt was taking Cipro for cyst on back of head [facility] [place] [facility], started on 11/24, pt states he took medication as directed x 3 days then stopped d/t stomach irritation.

## 2014-10-05 NOTE — ED Notes (Signed)
Pt aware of the need for a stool sample.

## 2014-10-10 ENCOUNTER — Emergency Department: Payer: Self-pay | Admitting: Emergency Medicine

## 2014-10-10 LAB — URINALYSIS, COMPLETE
Bacteria: NONE SEEN
Bilirubin,UR: NEGATIVE
Glucose,UR: NEGATIVE mg/dL (ref 0–75)
KETONE: NEGATIVE
Leukocyte Esterase: NEGATIVE
Nitrite: NEGATIVE
PH: 6 (ref 4.5–8.0)
Protein: NEGATIVE
SPECIFIC GRAVITY: 1.005 (ref 1.003–1.030)
SQUAMOUS EPITHELIAL: NONE SEEN

## 2014-10-10 LAB — CBC
HCT: 43.9 % (ref 40.0–52.0)
HGB: 14.3 g/dL (ref 13.0–18.0)
MCH: 28.7 pg (ref 26.0–34.0)
MCHC: 32.5 g/dL (ref 32.0–36.0)
MCV: 88 fL (ref 80–100)
PLATELETS: 204 10*3/uL (ref 150–440)
RBC: 4.98 10*6/uL (ref 4.40–5.90)
RDW: 14.7 % — ABNORMAL HIGH (ref 11.5–14.5)
WBC: 7.7 10*3/uL (ref 3.8–10.6)

## 2014-10-10 LAB — COMPREHENSIVE METABOLIC PANEL
ALBUMIN: 3.4 g/dL (ref 3.4–5.0)
ALT: 34 U/L
Alkaline Phosphatase: 52 U/L
Anion Gap: 5 — ABNORMAL LOW (ref 7–16)
BILIRUBIN TOTAL: 0.5 mg/dL (ref 0.2–1.0)
BUN: 5 mg/dL — ABNORMAL LOW (ref 7–18)
CALCIUM: 8.6 mg/dL (ref 8.5–10.1)
CO2: 26 mmol/L (ref 21–32)
CREATININE: 0.82 mg/dL (ref 0.60–1.30)
Chloride: 109 mmol/L — ABNORMAL HIGH (ref 98–107)
EGFR (African American): >60
EGFR (Non-African Amer.): >60
GLUCOSE: 94 mg/dL (ref 65–99)
OSMOLALITY: 276 (ref 275–301)
POTASSIUM: 4.1 mmol/L (ref 3.5–5.1)
SGOT(AST): 21 U/L (ref 15–37)
SODIUM: 140 mmol/L (ref 136–145)
TOTAL PROTEIN: 7 g/dL (ref 6.4–8.2)

## 2014-10-10 LAB — HCG, QUANTITATIVE, PREGNANCY

## 2014-10-10 LAB — LIPASE, BLOOD: LIPASE: 180 U/L (ref 73–393)

## 2014-10-10 LAB — CLOSTRIDIUM DIFFICILE(ARMC)

## 2014-11-14 ENCOUNTER — Emergency Department: Payer: Self-pay | Admitting: Emergency Medicine

## 2015-01-18 ENCOUNTER — Emergency Department (HOSPITAL_COMMUNITY)
Admission: EM | Admit: 2015-01-18 | Discharge: 2015-01-18 | Disposition: A | Discharge status: Home / Self Care | Payer: Self-pay | Attending: Emergency Medicine | Admitting: Emergency Medicine

## 2015-01-18 ENCOUNTER — Encounter (HOSPITAL_COMMUNITY): Payer: Self-pay | Admitting: *Deleted

## 2015-01-18 DIAGNOSIS — L0201 Cutaneous abscess of face: Secondary | ICD-10-CM

## 2015-01-18 DIAGNOSIS — Z79899 Other long term (current) drug therapy: Secondary | ICD-10-CM | POA: Insufficient documentation

## 2015-01-18 DIAGNOSIS — Z720 Tobacco use: Secondary | ICD-10-CM | POA: Insufficient documentation

## 2015-01-18 DIAGNOSIS — Z792 Long term (current) use of antibiotics: Secondary | ICD-10-CM | POA: Insufficient documentation

## 2015-01-18 MED ORDER — CLINDAMYCIN HCL 300 MG PO CAPS
300.0000 mg | ORAL_CAPSULE | Freq: Once | ORAL | Status: AC
  Administered 2015-01-18: 300 mg via ORAL
  Filled 2015-01-18: qty 1

## 2015-01-18 MED ORDER — CLINDAMYCIN HCL 300 MG PO CAPS: 300.0000 mg | ORAL_CAPSULE | Freq: Three times a day (TID) | ORAL | Status: DC

## 2015-01-18 MED ORDER — HYDROCODONE-ACETAMINOPHEN 5-325 MG PO TABS: 1.0000 | ORAL_TABLET | Freq: Four times a day (QID) | ORAL | Status: DC | PRN

## 2015-01-18 MED ORDER — HYDROCODONE-ACETAMINOPHEN 5-325 MG PO TABS
2.0000 | ORAL_TABLET | Freq: Once | ORAL | Status: AC
  Administered 2015-01-18: 2 via ORAL
  Filled 2015-01-18: qty 2

## 2015-01-18 MED ORDER — LIDOCAINE-EPINEPHRINE 2 %-1:100000 IJ SOLN
10.0000 mL | Freq: Once | INTRAMUSCULAR | Status: AC
  Administered 2015-01-18: 10 mL
  Filled 2015-01-18: qty 1

## 2015-01-18 NOTE — ED Notes (Signed)
Bed: WA07 Expected date:  Expected time:  Means of arrival:  Comments: 

## 2015-01-18 NOTE — ED Provider Notes (Signed)
CSN: [id number]     Arrival date & time 01/18/15  0630 History   First MD Initiated Contact with Patient 01/18/15 0732     Chief Complaint  Patient presents with  . Abscess     (Consider location/radiation/quality/duration/timing/severity/associated sxs/prior Treatment) The history is provided by the patient.  Jeffrey Prince is a 49 y.o. male who presenting with left facial infection. He noticed redness above his left eyebrow for the last 3 days. It has been progressively more swollen and painful. It did come to a head today but denies any purulent drainage. He has some intermittent headaches from that. He has previous skin infections but was never diagnosed with MRSA. Denies any drug use.    History reviewed. No pertinent past medical history. Past Surgical History  Procedure Laterality Date  . Colonoscopy      with polyp removal  . Rotator cuff repair    . Hemorrhoid surgery     No family history on file. History  Substance Use Topics  . Smoking status: Current Every Day Smoker  . Smokeless tobacco: Not on file  . Alcohol Use: No    Review of Systems  Skin: Positive for rash.  All other systems reviewed and are negative.     Allergies  Codeine  Home Medications   Prior to Admission medications   Medication Sig Start Date End Date Taking? Authorizing Provider  ibuprofen (ADVIL,MOTRIN) 200 MG tablet Take 400 mg by mouth every 6 (six) hours as needed for moderate pain.   Yes Historical Provider, MD  acetaminophen (TYLENOL) 325 MG tablet Take 650 mg by mouth every 6 (six) hours as needed for moderate pain (pain).    Historical Provider, MD  dicyclomine (BENTYL) 20 MG tablet Take 1 tablet (20 mg total) by mouth 2 (two) times daily. Patient not taking: Reported on 01/18/2015 10/05/14   [name], PA-C  metroNIDAZOLE (FLAGYL) 500 MG tablet Take 1 tablet (500 mg total) by mouth 3 (three) times daily. Patient not taking: Reported on 01/18/2015 10/05/14   Tatyana  Kirichenko, PA-C   BP 116/75 mmHg  Pulse 64  Temp(Src) 98 F (36.7 C)  Resp 18  SpO2 100% Physical Exam  Constitutional: He is oriented to person, place, and time.  Uncomfortable   HENT:  Head: Normocephalic.  Right Ear: External ear normal.  Left Ear: External ear normal.  Mouth/Throat: Oropharynx is clear and moist.  L temple area with erythema and mild fluctuance   Eyes: Conjunctivae are normal. Pupils are equal, round, and reactive to light.  Neck: Normal range of motion. Neck supple.  Cardiovascular: Normal rate, regular rhythm and normal heart sounds.   Pulmonary/Chest: Effort normal and breath sounds normal. No respiratory distress. He has no wheezes. He has no rales.  Abdominal: Soft. Bowel sounds are normal. He exhibits no distension. There is no tenderness. There is no rebound.  Musculoskeletal: Normal range of motion.  Neurological: He is alert and oriented to person, place, and time.  Skin: Skin is warm and dry. There is erythema.  Psychiatric: He has a normal mood and affect. His behavior is normal. Judgment normal.  Nursing note and vitals reviewed.   ED Course  Procedures (including critical care time)  EMERGENCY DEPARTMENT US SOFT TISSUE INTERPRETATION "Study: Limited Ultrasound of the noted body part in comments below"  INDICATIONS: Soft tissue infection Multiple views of the body part are obtained with a multi-frequency linear probe  PERFORMED BY:  Myself  IMAGES ARCHIVED?: Yes  SIDE:Left  BODY PART:Other soft tisse (comment in note)  FINDINGS: Abcess present  LIMITATIONS:  Body Habitus  INTERPRETATION:  Abcess present and Cellulitis present  COMMENT:  L facial abscess and cellulitis   INCISION AND DRAINAGE Performed by: Darl Householder, Nikol Lemar Consent: Verbal consent obtained. Risks and benefits: risks, benefits and alternatives were discussed Type: abscess  Body area: L temple  Anesthesia: local infiltration  Incision was made with a  scalpel.  Local anesthetic: lidocaine 2 % with epinephrine  Anesthetic total: 5 ml  Complexity: complex Blunt dissection to break up loculations  Drainage: purulent  Drainage amount: mod  Packing material: none  Patient tolerance: Patient tolerated the procedure well with no immediate complications.     Labs Review Labs Reviewed - No data to display  Imaging Review No results found.   EKG Interpretation None      MDM   Final diagnoses:  None    Jeffrey Prince is a 49 y.o. male here with L face infection. Bedside US confirmed abscess. It is close to L upper eyelid but doesn't seem to spread into the eyelid. Will do I and D and start on clinda.      Wandra Arthurs, MD 01/18/15 [phone]

## 2015-01-18 NOTE — Discharge Instructions (Signed)
Take motrin for pain.   Take vicodin for severe pain. DO NOT drive with it.   Take clinda for a week.   The wound will be draining for a few days and will then close up on its own.   See your doctor   Return to ER if you have fever, severe pain, worse redness.

## 2015-01-18 NOTE — ED Notes (Signed)
Pt states that he has had a bump over left eye for 3 days; pt states that it is more swollen and painful this am; pt states that he also has a headache; no drainage from area; pt with red raised area to above left eyebrow

## 2015-01-24 ENCOUNTER — Ambulatory Visit: Payer: Self-pay | Attending: Family Medicine | Admitting: Family Medicine

## 2015-01-24 ENCOUNTER — Encounter: Payer: Self-pay | Admitting: Family Medicine

## 2015-01-24 VITALS — BP 142/84 | HR 72 | Temp 98.2°F | Resp 16 | Ht 70.0 in | Wt 163.0 lb

## 2015-01-24 DIAGNOSIS — E784 Other hyperlipidemia: Secondary | ICD-10-CM

## 2015-01-24 DIAGNOSIS — E785 Hyperlipidemia, unspecified: Secondary | ICD-10-CM

## 2015-01-24 DIAGNOSIS — N529 Male erectile dysfunction, unspecified: Secondary | ICD-10-CM

## 2015-01-24 DIAGNOSIS — L738 Other specified follicular disorders: Secondary | ICD-10-CM

## 2015-01-24 DIAGNOSIS — Z114 Encounter for screening for human immunodeficiency virus [HIV]: Secondary | ICD-10-CM

## 2015-01-24 DIAGNOSIS — Z23 Encounter for immunization: Secondary | ICD-10-CM

## 2015-01-24 DIAGNOSIS — H5442 Blindness, left eye, normal vision right eye: Secondary | ICD-10-CM | POA: Insufficient documentation

## 2015-01-24 DIAGNOSIS — H547 Unspecified visual loss: Secondary | ICD-10-CM

## 2015-01-24 DIAGNOSIS — L03211 Cellulitis of face: Secondary | ICD-10-CM | POA: Insufficient documentation

## 2015-01-24 DIAGNOSIS — F17200 Nicotine dependence, unspecified, uncomplicated: Secondary | ICD-10-CM

## 2015-01-24 LAB — POCT URINALYSIS DIPSTICK
Bilirubin, UA: NEGATIVE
Glucose, UA: NEGATIVE
KETONES UA: NEGATIVE
Leukocytes, UA: NEGATIVE
Nitrite, UA: NEGATIVE
PROTEIN UA: NEGATIVE
Spec Grav, UA: 1.01
UROBILINOGEN UA: 0.2
pH, UA: 6

## 2015-01-24 LAB — HIV ANTIBODY (ROUTINE TESTING W REFLEX): HIV 1&2 Ab, 4th Generation: NONREACTIVE

## 2015-01-24 LAB — POCT GLYCOSYLATED HEMOGLOBIN (HGB A1C): Hemoglobin A1C: 5.6

## 2015-01-24 LAB — LIPID PANEL
CHOLESTEROL: 169 mg/dL (ref 0–200)
HDL: 37 mg/dL — AB (ref >=40)
LDL Cholesterol: 116 mg/dL — ABNORMAL HIGH (ref 0–99)
Total CHOL/HDL Ratio: 4.6 Ratio
Triglycerides: 82 mg/dL (ref <150)
VLDL: 16 mg/dL (ref 0–40)

## 2015-01-24 LAB — GLUCOSE, POCT (MANUAL RESULT ENTRY): POC GLUCOSE: 91 mg/dL (ref 70–99)

## 2015-01-24 MED ORDER — NICOTINE 14 MG/24HR TD PT24: 14.0000 mg | MEDICATED_PATCH | Freq: Every day | TRANSDERMAL | Status: DC

## 2015-01-24 MED ORDER — NICOTINE 21 MG/24HR TD PT24: 21.0000 mg | MEDICATED_PATCH | Freq: Every day | TRANSDERMAL | Status: DC

## 2015-01-24 MED ORDER — NICOTINE 7 MG/24HR TD PT24: 7.0000 mg | MEDICATED_PATCH | Freq: Every day | TRANSDERMAL | Status: DC

## 2015-01-24 MED ORDER — SILDENAFIL CITRATE 100 MG PO TABS: 50.0000 mg | ORAL_TABLET | Freq: Every day | ORAL | Status: DC | PRN

## 2015-01-24 NOTE — Patient Instructions (Addendum)
Mr. Donaway,  Thank you for coming in today. It was a pleasure meeting you. I look forward to being your primary doctor.   1. Paperwork for legal blindness- referral to optometry.   2. Erectile dysfunction-  viagra Rx, check on line for coupon  Smoking cessation Checking cholesterol  3. Smoking cessation: Smoking cessation support: smoking cessation hotline: 1-800-QUIT-NOW.  Smoking cessation classes are available through [facility]. Call [phone] or visit our website at [email or web address] Apply for PASS 21 mg patch for 6 weeks, 14 mg patch for 2 weeks 7 mg patch for 2 weeks.  Please apply for [place] discount and orange card, you can also inquire if any of your medications are on the PASS (medications assistance) list.   F/u in 6 week for facial folliculitis  Dr. Adrian Blackwater

## 2015-01-24 NOTE — Progress Notes (Signed)
   Subjective:    Patient ID: Jeffrey Prince, male    DOB: Sep 24, 1966, 49 y.o.   MRN: [id number] CC: establish care, blindness in L eye, erectile dysfunction, smoking, facial cellulitis  HPI  1. 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 Unsure of cause. Not under care of eye doctor. No pain or discharge. Needs paperwork filled out verifying legal blindness.  2. Erectile dysfunction: x 5 years. Has girlfriend who complains. Has desire but trouble getting erection. He is a smoker. No ETOH or illicit drugs. Has tried cialis in the past and it helped a bit.  3. Smoking: 1/2 PPD for 20+ yrs. Desires to quit. Has tried to quit cold [place] in past, unsuccessful. Interested in nicotine replacement and chatix. Has good support system.  4. Facial scars and folliculitis: taking clindamycin now. Most recent lesion was L eye brow. Also has lesion in scalp. Shaves. This is a long term problem. Has extensive facial scarring.   Soc Hx: current smoker 1/2 PPD Med Hx: folliculitis and acne in face and scalp with scarring Fam Hx: cancer in mother Review of Systems  Constitutional: Negative for fever.  Eyes: Positive for visual disturbance. Negative for pain, discharge and itching.  Respiratory: Positive for chest tightness. Negative for cough and shortness of breath.   Cardiovascular: Negative for chest pain.  Gastrointestinal: Negative for blood in stool and rectal pain.  Endocrine: Negative.  Negative for cold intolerance, heat intolerance, polydipsia, polyphagia and polyuria.  Genitourinary: Negative for dysuria, discharge, difficulty urinating, genital sores and penile pain.  Skin: Positive for wound.  Hematological: Negative for adenopathy.  Psychiatric/Behavioral: Negative for dysphoric mood. The patient is not nervous/anxious.        Objective:   Physical Exam BP 142/84 mmHg  Pulse 72  Temp(Src) 98.2 F (36.8 C) (Oral)  Resp 16  Ht 5\' 10"  (1.778 m)  Wt 163 lb (73.936 kg)  BMI 23.39 kg/m2  SpO2  96% General appearance: alert, cooperative and no distress  Eyes: conjunctivae/corneas clear in R eye. Cornea cloudy in L eye.  PERRLA, EOM's intact.  Lungs: clear to auscultation bilaterally Heart: regular rate and rhythm, S1, S2 normal, no murmur, click, rub or gallop Male genitalia: normal Rectal: normal tone, normal prostate, no masses or tenderness and soft brown guaiac negative stool noted Extremities: extremities normal, atraumatic, no cyanosis or edema Skin: papules and scars on face, papule in scalp. Non tender. No drainage. Face is shaven except for mustache  Lab Results  Component Value Date   HGBA1C 5.60 01/24/2015   CBG 91 UA: trace RBC, otherwise neg      Assessment & Plan:

## 2015-01-24 NOTE — Progress Notes (Signed)
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

## 2015-01-25 ENCOUNTER — Telehealth: Payer: Self-pay | Admitting: *Deleted

## 2015-01-25 DIAGNOSIS — E785 Hyperlipidemia, unspecified: Secondary | ICD-10-CM | POA: Insufficient documentation

## 2015-01-25 MED ORDER — ATORVASTATIN CALCIUM 20 MG PO TABS: 20.0000 mg | ORAL_TABLET | Freq: Every day | ORAL | Status: DC

## 2015-01-25 NOTE — Assessment & Plan Note (Signed)
A; chronic. On clindamycin now P: F/u and will likely start daily low dose doxycycline

## 2015-01-25 NOTE — Assessment & Plan Note (Signed)
Screening HIV negative

## 2015-01-25 NOTE — Assessment & Plan Note (Signed)
Elevated LDL and low HDL with elevated CAD risk due to smoking, recommend lipitor 20 mg daily

## 2015-01-25 NOTE — Telephone Encounter (Signed)
Unable to contact pt  Wrong phone number

## 2015-01-25 NOTE — Assessment & Plan Note (Signed)
A: Erectile dysfunction- suspect smoking, age Plan:  viagra Rx, check on line for coupon  Smoking cessation Checking cholesterol

## 2015-01-25 NOTE — Telephone Encounter (Signed)
-----   Message from Boykin Nearing, MD sent at 01/25/2015  9:00 AM EDT ----- Screening HIV negative Elevated LDL and low HDL with elevated CAD risk due to smoking, recommend lipitor 20 mg daily

## 2015-01-25 NOTE — Assessment & Plan Note (Signed)
Smoking cessation: counseling done in office today. Decided on patches to help quit.  Smoking cessation support: smoking cessation hotline: 1-800-QUIT-NOW.  Smoking cessation classes are available through [facility]. Call [phone] or visit our website at [email or web address] Apply for PASS 21 mg patch for 6 weeks, 14 mg patch for 2 weeks 7 mg patch for 2 weeks.

## 2015-01-25 NOTE — Assessment & Plan Note (Signed)
Paperwork for legal blindness- referral to optometry. Patient visual acuity from L eye was completely negative.

## 2015-02-25 NOTE — Op Note (Signed)
PATIENT NAME:  Jeffrey Prince, Jeffrey Prince MR#:  [id number] DATE OF BIRTH:  [birth year]  DATE OF PROCEDURE:  05/09/2014  PREOPERATIVE DIAGNOSIS: Hemorrhoids.   POSTOPERATIVE DIAGNOSIS: External and internal hemorrhoids.  PROCEDURE:  Hemorrhoidectomy.  SURGEON: Dia Crawford, MD  ASSISTANTMaud Deed, PA student  ANESTHESIA: General.  DESCRIPTION OF PROCEDURE: With the patient in the supine position, after induction of appropriate general anesthesia, the patient was placed in lithotomy position, appropriately padded and positioned. He had a large, edematous external hemorrhoid, which was non-thrombosed, and a concomitant internal hemorrhoid in the same column. The site of his previous hemorrhoidectomy was clearly visible and well healed. There were no other major hemorrhoid columns. There was a small hemorrhoid cluster at the 1 o'clock position in lithotomy, but otherwise the rectum looked good. The area had been well-cleaned out prior to surgery. I elected to remove the external hemorrhoid using the LigaSure apparatus. The specimen was passed off the table. I did not close that portion of the repair. There was a large internal column. I grasped it with 2 Allis clamps, dissected it at its base with the LigaSure apparatus and then closed the mucosal defect. I did leave a drainage tract. The area appeared to be satisfactorily dry. A packing of Gelfoam and Avitene was placed in the rectum and the rectum was injected with 20 mL of Exparel. Sterile dressing was applied. The patient was returned to the recovery room having tolerated the procedure well. Sponge, instrument and needle counts were correct x2 in the operating room.     ____________________________ Rodena Goldmann III, MD rle:sb D: 05/09/2014 08:40:07 ET T: 05/09/2014 09:06:08 ET JOB#: [id number]  cc: Micheline Maze, MD, <Dictator> Glendon Axe, MD Rodena Goldmann MD ELECTRONICALLY SIGNED 05/11/2014 8:50

## 2015-02-26 NOTE — Consult Note (Signed)
Chief Complaint:   Subjective/Chief Complaint patient seen and examined please see full GI consult.  Patietn admitted with recurrent rectal bleeding.  He has a very tender anal orifice with appearance of a hemorrhoidal extrusion.  Patient last had a colonoscopy with Dr Rhona Leavens in 05/2005, the proceedure done for rectal bleeding (note, not for history of colon polyps).  This proceedure showed no colon polyps but small internal hemorrhoids.  Agree with need for luminal evaluation, both egd and colonoscopy.  However with patient presentation of marked rectal pain that prohibits adequate examination, I do agree with exam under anesthesia by surgery prior to using a colon prep and doing full luminal evaluation.  Discussed with Dr Burt Knack.  Following.   VITAL SIGNS/ANCILLARY NOTES: **Vital Signs.:   03-Apr-13 11:27   Temperature Temperature (F) 98.5   Celsius 36.9   Temperature Source oral   Pulse Pulse 51   Pulse source per Dinamap   Respirations Respirations 16   Systolic BP Systolic BP 446   Diastolic BP (mmHg) Diastolic BP (mmHg) 66   Mean BP 83   BP Source Dinamap   Pulse Ox % Pulse Ox % 100   Pulse Ox Activity Level  At rest   Oxygen Delivery Room Air/ 21 %   Electronic Signatures: Loistine Simas (MD)  (Signed 03-Apr-13 18:28)  Authored: Chief Complaint, VITAL SIGNS/ANCILLARY NOTES   Last Updated: 03-Apr-13 18:28 by Loistine Simas (MD)

## 2015-02-26 NOTE — Consult Note (Signed)
Brief Consult Note: Diagnosis: Rectal bleeding.   Patient was seen by consultant.   Consult note dictated.   Comments: Appreciate consult for 49 y/o African American gentlman with history of external hemorrhoids for rectal bleeding. Mr Kimura reports an onset of bright red blood per rectum and rectal discomfort this last Saturday: has been seen for same as outpatient in surgery clinic earlier in the year and recently in the ED prior to this admission. Has received hydrocortisone suppositories: uses them intermittently.  States that he he has been having 2-3 loose stools daily since then with some lower abdominal cramping. Amounts of bleeding varies: states on Saturday the blood filled the toilet bowel, but at other times has been on toilet paper only. Has had 3 stools so far today- states they all had blood, the last with it on the wipe only. Does relate a history of intermittent diarrhea and bilateral lower abdominal cramping for a few years. Unsure what causes it. Says it feels like food goes right through him at times, with some occasional nausea. States he has been taking some Aleve to help with the abdominal and rectal pain, without good results,  and did notice once black stool earlier this week, has had some acid reflux in past that he takes otc prilosec for with fair results. Reports sweating while asleep and weight loss of 10lb over the last 2 months and a history of colonic polyps on colonscopy done several years ago at [facility].Denies all other symptoms today.  Last colonoscopy was done here by Dr Sonny Masters which revealed internal hemorrhoids only. Lab work essentially normal; abdomen with nonspecific diffuse tenderness, rectum with several very inflamed erythematous external hemorrhoids with tenderness.  Impression: 1. Ext hemorrhoids-agree with plans for rectal exam/anoscopy under anesthesia in order to address his hemorrhoids. Would recommend this first, before proceeding with luminal eval,  which the patient should have                   2. Lower abd pain, diarrhea, and NSAID use: will order stool studies and plan for luminal evaluation once hemorrhoids addressed  Electronic Signatures: Theodore Demark (NP)  (Signed 03-Apr-13 15:38)  Authored: Brief Consult Note   Last Updated: 03-Apr-13 15:38 by Theodore Demark (NP)

## 2015-02-26 NOTE — Consult Note (Signed)
Chief Complaint:   Subjective/Chief Complaint still with rectal pain, appropriate post op.  less rectal bleeding.  decreased epigstric discomfort, less lower abd discomfort. had bm today.   Brief Assessment:   Cardiac Regular    Respiratory clear BS    Gastrointestinal details normal Soft  Nondistended  Bowel sounds normal  less distension than yesterday, mild lower abd pain to palpation bilaterally, some improvement.   Assessment/Plan:  Assessment/Plan:   Assessment 1) recurrent rectal bleeding-hemorrhoidal, now s/p hemorrhoidectomy 2) epigastric pain in the setting of recent nsaid use improving with ppi 3) some continued lower abd pain, much less abdominal distension after bm/flatus.    Plan 1) continue ppi.  EGD and colonoscpy when clinically feasible,ie once hemorrhoidal surgery better. recommen GI fu as op 2) will need surgical release for colonoscopy.   Electronic Signatures: Loistine Simas (MD)  (Signed 06-Apr-13 00:34)  Authored: Chief Complaint, Brief Assessment, Assessment/Plan   Last Updated: 06-Apr-13 00:34 by Loistine Simas (MD)

## 2015-02-26 NOTE — Consult Note (Signed)
Chief Complaint:   Subjective/Chief Complaint patient did well with EUA and hemorrhoidectomy last night.  mild abdominal pain generalized.   VITAL SIGNS/ANCILLARY NOTES: **Vital Signs.:   04-Apr-13 09:50   Pulse Pulse 59   Pulse source per Dinamap   Respirations Respirations 18   Systolic BP Systolic BP 545   Diastolic BP (mmHg) Diastolic BP (mmHg) 68   Mean BP 89   BP Source Dinamap   Pulse Ox % Pulse Ox % 98   Pulse Ox Activity Level  At rest   Oxygen Delivery Room Air/ 21 %    14:30   Temperature Temperature (F) 97.5   Celsius 36.3   Temperature Source oral   Pulse Pulse 54   Pulse source per Dinamap   Respirations Respirations 18   Systolic BP Systolic BP 625   Diastolic BP (mmHg) Diastolic BP (mmHg) 61   Mean BP 77   BP Source Dinamap   Pulse Ox % Pulse Ox % 99   Pulse Ox Activity Level  At rest   Oxygen Delivery Room Air/ 21 %   Brief Assessment:   Cardiac Regular    Respiratory clear BS    Gastrointestinal details normal mild distension with mild tympany.  some generalized tenderness, mostly epigastric and bilateral lower quadrnats.   Radiology Results: XRay:    03-Apr-13 10:28, Abdomen Flat and Erect   Abdomen Flat and Erect    REASON FOR EXAM:    Abdominal pain  COMMENTS:       PROCEDURE: DXR - DXR ABDOMEN 2 V FLAT AND ERECT  - Feb 05 2012 10:28AM     RESULT: Air-fluid level is seen within a moderately distended stomach.   Air is seen within loops of large and small bowel without abnormal bowel   distention or evidence of pneumoperitoneum. Air and fecal material is   present in the colon to the rectosigmoid region. The bony structures are   unremarkable.    IMPRESSION:      No definite bowel obstruction or perforation.    Thank you for the opportunity to contribute to the care of your patient.           Verified By: Sundra Aland, M.D., MD   Assessment/Plan:  Assessment/Plan:   Assessment 1) rectal bleeding, likely anal outlert with  hemorrhoids, now s/p hemorrhoidectomy 2) nsaid use- currently on h2ra bid, in light of the nsaid use and epigastric discomfort, will change to bid ppi.  3) remote h/o colon polyps, will arrange for outpatient egd and colonoscopy once ok by surgery    Plan 1) will recheck labs, note #2 above.   Electronic Signatures: Loistine Simas (MD)  (Signed 04-Apr-13 16:41)  Authored: Chief Complaint, VITAL SIGNS/ANCILLARY NOTES, Brief Assessment, Radiology Results, Assessment/Plan   Last Updated: 04-Apr-13 16:41 by Loistine Simas (MD)

## 2015-02-26 NOTE — H&P (Signed)
PATIENT NAME:  Jeffrey Prince, Jeffrey Prince MR#:  [id number] DATE OF BIRTH:  Jun 12, 1966  DATE OF ADMISSION:  02/04/2012  PRIMARY CARE PHYSICIAN: None.   ADMITTING PHYSICIAN: Dr. Pat Patrick    CHIEF COMPLAINT: Rectal pain and bleeding, abdominal pain.   BRIEF HISTORY: Jeffrey Prince is a 49 year old gentleman with a second evaluation in the Emergency Room over the last 72 hours with rectal pain and bleeding. He has had a longstanding history of hemorrhoid disease dating back several years but over the last several months has had increasing symptoms. He was evaluated earlier in the year as an outpatient in our office and treated with conservative therapy. He was given suppository therapy and used the suppositories intermittently. He returned to the Emergency Room over the weekend with continued discomfort and was again placed on suppository therapy. He did not use the suppositories on a regular basis. He noted increase in his rectal bleeding which is his biggest concern and presented back to the Emergency Room for further evaluation. ED evaluation suspected thrombosed external hemorrhoids and consulted the surgical service for reduction and possible surgery.   The patient has an extensive history of cocaine and alcohol abuse with several Behavioral Medicine evaluations. He denies any alcohol or cocaine use within the last year. He did not have any previous rectal surgery. He has had multiple colonoscopies in the past, most recent in 2005 at which time he was noted to have some polypoid disease. He was advised to come back in five years and he's not returned for that evaluation. He is set up to see the [facility] gastroenterologist, Dr. Dionne Milo, mid April for follow-up and possible repeat colonoscopy.   The patient also complains of intermittent abdominal pain primarily bilateral lower quadrant with intermittent constipation and diarrhea. His diarrhea symptoms have increased over the last several months and he feels this  could have contributed to his current hemorrhoidal problems. He is very concerned about the possibility of recurrent polyp disease or possible colon malignancy. He denies any other significant GI history including hepatitis, yellow jaundice, pancreatitis, peptic ulcer disease, gallbladder disease, or diverticulitis. His only previous surgeries are inguinal hernia and rotator cuff repair. He denies any cardiac disease or diabetes. He is moderately hypertensive currently not under any therapy.   SOCIAL HISTORY: He is a cigarette smoker and has a substance abuse problem as noted above. He is unemployed at the present time.   FAMILY HISTORY: Noncontributory.   REVIEW OF SYSTEMS: 10 point review of systems was accomplished with the patient and no other significant abnormalities were uncovered.   PHYSICAL EXAMINATION:   GENERAL: He is an alert moderately comfortable gentleman in no significant distress.   VITALS: Blood pressure 150/93, heart rate 71 and regular, oxygen saturation 100% on room air. He is not febrile.   HEENT: No facial deformities. No scleral icterus. No abnormality in his pupils.   NECK: Supple without adenopathy. His trachea is midline.   CHEST: Clear with very distant breath sounds. Appears to have normal pulmonary excursion.   CARDIAC: No murmurs or gallops to my ear and he seems to be in normal sinus rhythm.   ABDOMEN: Bilateral lower quadrant generalized tenderness and mild distention. He has no point tenderness. No rebound. No guarding. No masses. No hernias noted. He does have active bowel sounds.   RECTAL: Rectal examination demonstrates some edematous external hemorrhoids and one area of prolapsed internal hemorrhoids. I do not see any obvious masses and there is no evidence of any thrombosis at the  present time. He is so uncomfortable I cannot attempt reduction at the bedside.   EXTREMITIES: No significant deformity. He does have good distal pulses.   PSYCHIATRIC:  Normal affect. Normal orientation.   IMPRESSION: This gentleman has significant symptomatic hemorrhoid disease but also has increase in his rectal bleeding. The last couple of bowel movements have been nothing but blood estimated to be between 4 and 8 ounces of blood. We will plan to admit him to the hospital. His hemoglobin is stable. With his return within 72 hours, I don't think this is a problem we can manage as an outpatient. Will talk to his gastroenterologist about possible endoscopy while he is hospitalized. We will perform exam under anesthesia if necessary and attempt to diminish his hemorrhoid symptoms. I would like to avoid surgery until we make a definitive decision about the need for any other Gastroenterology procedure.   The patient is in agreement with this plan. Will put him back on suppositories and will plan to see him and see how he improves over the course of the evening.   ____________________________ Micheline Maze, MD rle:drc D: 02/04/2012 21:46:12 ET T: 02/05/2012 07:23:09 ET JOB#: [id number]  cc: Rodena Goldmann III, MD, <Dictator> Rodena Goldmann MD ELECTRONICALLY SIGNED 02/05/2012 22:00

## 2015-02-26 NOTE — Consult Note (Signed)
PATIENT NAME:  Jeffrey Prince, Jeffrey Prince MR#:  [id number] DATE OF BIRTH:  September 02, 1966  DATE OF CONSULTATION:  02/05/2012  REFERRING PHYSICIAN:   CONSULTING PHYSICIAN:  Theodore Demark, NP  PRIMARY CARE PHYSICIAN: None.   HISTORY OF PRESENT ILLNESS:  Jeffrey Prince is a 48 year old African American gentleman with a history of external hemorrhoids admitted for rectal bleeding by Dr. Pat Patrick who has consulted gastroenterology for the same issue. Jeffrey Prince reports an onset of bright red blood per rectum and rectal discomfort this last Saturday, has been seen for same as outpatient in surgery clinic earlier this year and recently in the Emergency Department prior to this admission. He has received hydrocortisone suppositories for this and has been using them intermittently. States that he has been having 2 to 3 loose stools daily since Saturday with some lower abdominal cramping and rectal bleeding. States the amount of bleeding varies. On Saturday the blood filled the toilet bowl but other times it has been on toilet paper only.  He has had three stools so far today, states they all had some blood. The last was on the wipe only.  He does relate a history of intermittent diarrhea, bilateral lower abdominal cramping for a few years, unsure what causes it. He says it feels like food goes right through him at times with occasional nausea. States he has been taking some Aleve to help with the abdominal and rectal pain without good results. Did notice 1 black stool earlier this week. Has had some acid reflux in the recent past that he takes over-the-counter Prilosec for with fair results. Also reports sweating while asleep and weight loss of 10 pounds over the last two months. No history of colonic polyps on colonoscopy done several years ago at [facility]. Denies all other symptoms today. Last colonoscopy was done here by Dr. Sonny Masters which revealed internal hemorrhoids only. This was 2006.   PAST MEDICAL HISTORY: None.    PAST SURGICAL HISTORY: None.   MEDICATIONS:  1. Proctosol suppositories p.r.n.  2. Prilosec OTC p.r.n.  3. Aleve p.r.n.   ALLERGIES: Codeine.   SOCIAL HISTORY: Does have history positive for cocaine and alcohol abuse. States he has not used either of these in about a year. Continues to smoke cigarettes, has a 7.5 pack-year history of this. Unemployed at present.   FAMILY HISTORY: Negative for colorectal cancer.   REVIEW OF SYSTEMS:  GENERAL: Positive for weight loss and sweating while asleep.  No fever. PSYCHIATRIC: No complaints of depression or anxiety. HEENT: No headaches, visual changes, sore throat, drainage from the ears or nares or eyes. No tinnitus or difficulty hearing.  RESPIRATORY: No shortness of breath, cough, or wheeze. CARDIAC: Denies chest pain, palpitations, murmur, or dyspnea. ABDOMEN: As noted. GU: Denies dysuria, hematuria, or frequency.  ENDOCRINE: No history of diabetes or thyroid disease. No polyuria, polydipsia, or polyphagia. No hot or cold intolerance. MUSCULOSKELETAL: No history of arthritis at present. Denies unusual muscle or joint pain. No history of neuropathy. Able to use his extremities well with full weight-bearing. SKIN: No history of the erythema, lesion, or rash.  NEUROLOGIC: No history of stroke, numbness, tingling, fainting, dizziness, or seizures.   LABS/STUDIES: Most recent lab work: Serum glucose 135, BUN 9, creatinine 0.98, serum sodium 140, potassium 4.3, chloride 106, GFR greater than 60, calcium 9.2, lipase 157, total serum protein 8.7, albumin 4.1, total bilirubin 0.5, ALT 64, AST 25, ALT 31, WBC 6.7, hemoglobin 15.4, hematocrit 46, platelet count 229, MCV 84, MCH 28.2,  MCHC 33.5, RDW 15.3, normal distribution to his white cell differential.   PHYSICAL EXAMINATION:  VITAL SIGNS: Most recent vital signs: Temperature 98.5, pulse 51, respiratory rate 16, blood pressure 119/66, oxygen saturation 100% on room air.   GENERAL: Well-appearing gentleman  in no acute distress.   PSYCHIATRIC: Cooperative, pleasant, mood stable, logical train of thought.   HEENT: Normocephalic, atraumatic. Does have multiple acne scars. No redness, drainage, or inflammation to the eyes or nares. Mouth with pink and moist oral mucous membranes.   NECK: Supple. No JVD, adenopathy, or thyromegaly.   RESPIRATORY: Respirations eupneic. Lungs clears bilaterally.   CARDIAC: S1, S2, regular rate and rhythm. No MRG. Peripheral pulses 2+ bilaterally. No appreciable edema.   ABDOMEN: Nondistended. Bowel sounds times four. Generalized nonspecific diffuse tenderness. No rebound, guarding, peritoneal signs, masses, or hernias noted.   RECTAL: Exam reveals several erythematous, tender, protruding external hemorrhoids. Tenderness precludes internal exam at this time.   EXTREMITIES: Moves all extremities well times four. Gait not tested at this time. Sensation appears to be intact. No clubbing or cyanosis.   GENITOURINARY: Deferred.   NEUROLOGICAL: Alert and oriented times three. Cranial nerves II through XII intact. Speech clear. No facial droop.   IMPRESSION AND PLAN:  1. External hemorrhoids. Agree with plans for rectal exam/anoscopy under anesthesia in order to address his hemorrhoids. Would recommend this first before proceeding with luminal evaluation which the patient should have.  2. Lower abdominal pain, diarrhea, and NSAID use. We will order stool studies and plan for luminal evaluation once hemorrhoids are addressed.   Thank you for this consult. GI will be happy to follow.   These services were provided by Stephens November, MSN, [facility] in collaboration with Dr. Gustavo Lah.   ____________________________ Theodore Demark, NP chl:bjt D: 02/05/2012 16:34:10 ET T: 02/05/2012 16:58:35 ET JOB#: [id number]  cc: Theodore Demark, NP, <Dictator>   Dutch John SIGNED 02/07/2012 11:36

## 2015-02-26 NOTE — Op Note (Signed)
PATIENT NAME:  Jeffrey Prince, Jeffrey Prince MR#:  [id number] DATE OF BIRTH:  09/03/1966  DATE OF PROCEDURE:  02/05/2012  PREOPERATIVE DIAGNOSIS: External and internal hemorrhoids.   POSTOPERATIVE DIAGNOSIS: External and internal hemorrhoids.   OPERATION: Rectal exam under anesthesia with hemorrhoidectomy.   SURGEON: Rodena Goldmann, III, MD   ANESTHESIA: General.   OPERATIVE PROCEDURE: With the patient in the supine position after induction of appropriate general anesthesia, the patient was placed in lithotomy position, appropriately padded and positioned. The perineal area was prepped with Betadine and draped with sterile towels. Digital examination was unremarkable. He had the area infiltrated with 0.25% Marcaine for assistance with sphincter dilatation and postoperative pain control. Bivalve retractor was used to evaluate the rectum. There were multiple large clusters of both internal and external hemorrhoids. The largest clusters were at 2 o'clock, 5 o'clock, and 8 o'clock. The 8 o'clock position did not appear to be significantly inflamed or irritated and I was concerned about removing too much of the rectal mucosa. For that reason I concentrated on the columns at 2 o'clock and 5 o'clock. The anoderm was incised and using LigaSure apparatus the hemorrhoid was excised. The mucosal defects were repaired with 2-0 Vicryl leaving a small drainage tract over the anoderm. The area was then re-examined. Bleeding appeared to be satisfactorily under control. There did not appear to be any significant hematoma. A packing of saline soaked Gelfoam and Avitene was inserted in the rectum and sterile dressing applied.   The patient was returned to the recovery room having tolerated the procedure well. Sponge, instrument, and needle counts were correct x2 in the operating room.   ____________________________ Rodena Goldmann III, MD rle:drc D: 02/05/2012 23:20:45 ET T: 02/06/2012 09:38:57 ET JOB#: [id number]  cc: Rodena Goldmann  III, MD, <Dictator> Rodena Goldmann MD ELECTRONICALLY SIGNED 02/14/2012 7:23

## 2015-02-26 NOTE — Discharge Summary (Signed)
PATIENT NAME:  Jeffrey Prince, Jeffrey Prince MR#:  [id number] DATE OF BIRTH:  1965-12-07  DATE OF ADMISSION:  02/04/2012 DATE OF DISCHARGE:  02/08/2012  FINAL DIAGNOSES:  1. Symptomatic internal/external hemorrhoids.  2. History of rectal bleeding.   PROCEDURES:  1. GI Medicine consultation featuring Dr. Gustavo Lah   2. Examination under anesthesia with two column hemorrhoidectomy on 02/05/2012.   HOSPITAL COURSE SUMMARY: The patient was admitted with symptomatic hemorrhoids and rectal bleeding. He was seen by GI Medicine as he had an appointment scheduled in the near future for evaluation of diarrhea and abdominal pain. Examination under anesthesia and two column hemorrhoidectomy was performed by Dr. Pat Patrick on 02/05/2012. Postoperatively the patient had significant pain and difficulties with pain management. Medications were changed on the 5th to include Toradol and Percocet. The patient had adequate pain control with this. 5% lidocaine ointment was started b.i.d. He seemed to be healing well with no evidence of fever or voiding difficulties. On postoperative day #3, the patient was stable and improved for discharge home.   DISCHARGE MEDICATIONS:  1. Lidocaine 5% ointment b.i.d. to affected area, small amount. 2. Percocet 5/325 1 to 2 tabs p.o. q.6 hours p.r.n. pain, #40, no refill.  3. Colace 100 mg by mouth daily as needed for constipation, may be purchased over-the-counter.  ____________________________ Jeannette How. Marina Gravel, MD mab:drc D: 02/08/2012 09:25:11 ET T: 02/10/2012 12:50:13 ET JOB#: [id number]  cc: Elta Guadeloupe A. Marina Gravel, MD, <Dictator> Laterrance Nauta Bettina Gavia MD ELECTRONICALLY SIGNED 02/11/2012 7:31

## 2015-03-07 ENCOUNTER — Ambulatory Visit: Payer: Self-pay | Admitting: Family Medicine

## 2015-05-08 ENCOUNTER — Encounter (HOSPITAL_COMMUNITY): Payer: Self-pay

## 2015-05-08 ENCOUNTER — Emergency Department (HOSPITAL_COMMUNITY)
Admission: EM | Admit: 2015-05-08 | Discharge: 2015-05-08 | Disposition: A | Discharge status: Home / Self Care | Payer: Self-pay | Attending: Emergency Medicine | Admitting: Emergency Medicine

## 2015-05-08 DIAGNOSIS — Z79899 Other long term (current) drug therapy: Secondary | ICD-10-CM | POA: Insufficient documentation

## 2015-05-08 DIAGNOSIS — R197 Diarrhea, unspecified: Secondary | ICD-10-CM | POA: Insufficient documentation

## 2015-05-08 DIAGNOSIS — Z720 Tobacco use: Secondary | ICD-10-CM | POA: Insufficient documentation

## 2015-05-08 DIAGNOSIS — H5442 Blindness, left eye, normal vision right eye: Secondary | ICD-10-CM | POA: Insufficient documentation

## 2015-05-08 DIAGNOSIS — R1084 Generalized abdominal pain: Secondary | ICD-10-CM | POA: Insufficient documentation

## 2015-05-08 DIAGNOSIS — R109 Unspecified abdominal pain: Secondary | ICD-10-CM

## 2015-05-08 DIAGNOSIS — R630 Anorexia: Secondary | ICD-10-CM | POA: Insufficient documentation

## 2015-05-08 LAB — CBC WITH DIFFERENTIAL/PLATELET
Basophils Absolute: 0 10*3/uL (ref 0.0–0.1)
Basophils Relative: 0 % (ref 0–1)
Eosinophils Absolute: 0.1 10*3/uL (ref 0.0–0.7)
Eosinophils Relative: 3 % (ref 0–5)
HCT: 46.8 % (ref 39.0–52.0)
Hemoglobin: 15.6 g/dL (ref 13.0–17.0)
Lymphocytes Relative: 22 % (ref 12–46)
Lymphs Abs: 1.3 10*3/uL (ref 0.7–4.0)
MCH: 28.8 pg (ref 26.0–34.0)
MCHC: 33.3 g/dL (ref 30.0–36.0)
MCV: 86.5 fL (ref 78.0–100.0)
Monocytes Absolute: 0.4 10*3/uL (ref 0.1–1.0)
Monocytes Relative: 7 % (ref 3–12)
Neutro Abs: 3.9 10*3/uL (ref 1.7–7.7)
Neutrophils Relative %: 68 % (ref 43–77)
Platelets: 217 10*3/uL (ref 150–400)
RBC: 5.41 MIL/uL (ref 4.22–5.81)
RDW: 13 % (ref 11.5–15.5)
WBC: 5.7 10*3/uL (ref 4.0–10.5)

## 2015-05-08 LAB — COMPREHENSIVE METABOLIC PANEL
ALT: 19 U/L (ref 17–63)
AST: 18 U/L (ref 15–41)
Albumin: 3.9 g/dL (ref 3.5–5.0)
Alkaline Phosphatase: 43 U/L (ref 38–126)
Anion gap: 6 (ref 5–15)
BUN: 10 mg/dL (ref 6–20)
CO2: 26 mmol/L (ref 22–32)
Calcium: 9.1 mg/dL (ref 8.9–10.3)
Chloride: 105 mmol/L (ref 101–111)
Creatinine, Ser: 0.9 mg/dL (ref 0.61–1.24)
GFR calc Af Amer: >60 mL/min (ref >60)
GFR calc non Af Amer: >60 mL/min (ref >60)
Glucose, Bld: 87 mg/dL (ref 65–99)
Potassium: 4.7 mmol/L (ref 3.5–5.1)
Sodium: 137 mmol/L (ref 135–145)
Total Bilirubin: 0.5 mg/dL (ref 0.3–1.2)
Total Protein: 7.1 g/dL (ref 6.5–8.1)

## 2015-05-08 LAB — URINALYSIS, ROUTINE W REFLEX MICROSCOPIC
Bilirubin Urine: NEGATIVE
Glucose, UA: NEGATIVE mg/dL
Hgb urine dipstick: NEGATIVE
Ketones, ur: NEGATIVE mg/dL
Leukocytes, UA: NEGATIVE
Nitrite: NEGATIVE
Protein, ur: NEGATIVE mg/dL
Specific Gravity, Urine: 1.004 — ABNORMAL LOW (ref 1.005–1.030)
Urobilinogen, UA: 0.2 mg/dL (ref 0.0–1.0)
pH: 6.5 (ref 5.0–8.0)

## 2015-05-08 LAB — LIPASE, BLOOD: Lipase: 14 U/L — ABNORMAL LOW (ref 22–51)

## 2015-05-08 MED ORDER — DICYCLOMINE HCL 10 MG PO CAPS
10.0000 mg | ORAL_CAPSULE | Freq: Once | ORAL | Status: AC
  Administered 2015-05-08: 10 mg via ORAL
  Filled 2015-05-08: qty 1

## 2015-05-08 MED ORDER — TRAMADOL HCL 50 MG PO TABS: 50.0000 mg | ORAL_TABLET | Freq: Four times a day (QID) | ORAL | Status: DC | PRN

## 2015-05-08 MED ORDER — DICYCLOMINE HCL 20 MG PO TABS: 20.0000 mg | ORAL_TABLET | Freq: Four times a day (QID) | ORAL | Status: DC | PRN

## 2015-05-08 MED ORDER — ONDANSETRON 4 MG PO TBDP
4.0000 mg | ORAL_TABLET | Freq: Once | ORAL | Status: AC
  Administered 2015-05-08: 4 mg via ORAL
  Filled 2015-05-08: qty 1

## 2015-05-08 MED ORDER — OXYCODONE-ACETAMINOPHEN 5-325 MG PO TABS
2.0000 | ORAL_TABLET | Freq: Once | ORAL | Status: AC
  Administered 2015-05-08: 2 via ORAL
  Filled 2015-05-08: qty 2

## 2015-05-08 NOTE — ED Notes (Signed)
Patient c/o intermittent nausea, generalized abdominal pain, and diarrhea x 2 months, but daily x  3 days. patient states he has lost approx 15 lbs in the past 2 months.

## 2015-05-08 NOTE — Discharge Instructions (Signed)

## 2015-05-09 NOTE — ED Provider Notes (Signed)
CSN: [id number]     Arrival date & time 05/08/15  1315 History   First MD Initiated Contact with Patient 05/08/15 1321     Chief Complaint  Patient presents with  . Abdominal Pain  . Diarrhea     (Consider location/radiation/quality/duration/timing/severity/associated sxs/prior Treatment) HPI   77 rolled male with abdominal pain and loose stools. Ongoing for the past 2 months but feels like symptoms of worsened within the past couple weeks. Abdominal pain is diffuse. Achy in character. No appreciable exacerbating or relieving factors. Approximately 15 pound unintentional weight loss over the same time.. Anorexia. No vomiting. No urinary complaints. Patient reports similar type symptoms approximately 2 years ago without clear etiology but subsequently resolved. No blood in stool, melena or change in caliber stool.  Past Medical History  Diagnosis Date  . Blind left eye     Since birth   Past Surgical History  Procedure Laterality Date  . Colonoscopy      with polyp removal  . Rotator cuff repair    . Hemorrhoid surgery     Family History  Problem Relation Age of Onset  . Cancer Mother   . Cancer Paternal Uncle    History  Substance Use Topics  . Smoking status: Current Every Day Smoker -- 0.50 packs/day    Types: Cigarettes  . Smokeless tobacco: Never Used  . Alcohol Use: No    Review of Systems  All systems reviewed and negative, other than as noted in HPI.   Allergies  Bee venom and Codeine  Home Medications   Prior to Admission medications   Medication Sig Start Date End Date Taking? Authorizing Provider  silodosin (RAPAFLO) 4 MG CAPS capsule Take 4 mg by mouth daily with breakfast.   Yes Historical Provider, MD  atorvastatin (LIPITOR) 20 MG tablet Take 1 tablet (20 mg total) by mouth daily. Patient not taking: Reported on 05/08/2015 01/25/15   Boykin Nearing, MD  clindamycin (CLEOCIN) 300 MG capsule Take 1 capsule (300 mg total) by mouth 3 (three) times daily. X  7 days Patient not taking: Reported on 05/08/2015 01/18/15   Wandra Arthurs, MD  dicyclomine (BENTYL) 20 MG tablet Take 1 tablet (20 mg total) by mouth every 6 (six) hours as needed for spasms. 05/08/15   Virgel Manifold, MD  HYDROcodone-acetaminophen (NORCO/VICODIN) 5-325 MG per tablet Take 1 tablet by mouth every 6 (six) hours as needed. Patient not taking: Reported on 05/08/2015 01/18/15   Wandra Arthurs, MD  nicotine (NICODERM CQ - DOSED IN MG/24 HOURS) 14 mg/24hr patch Place 1 patch (14 mg total) onto the skin daily. Patient not taking: Reported on 05/08/2015 01/24/15   Boykin Nearing, MD  nicotine (NICODERM CQ - DOSED IN MG/24 HOURS) 21 mg/24hr patch Place 1 patch (21 mg total) onto the skin daily. Patient not taking: Reported on 05/08/2015 01/24/15   Boykin Nearing, MD  nicotine (NICODERM CQ - DOSED IN MG/24 HR) 7 mg/24hr patch Place 1 patch (7 mg total) onto the skin daily. Patient not taking: Reported on 05/08/2015 01/24/15   Boykin Nearing, MD  sildenafil (VIAGRA) 100 MG tablet Take 0.5-1 tablets (50-100 mg total) by mouth daily as needed for erectile dysfunction. Patient not taking: Reported on 05/08/2015 01/24/15   Boykin Nearing, MD  traMADol (ULTRAM) 50 MG tablet Take 1 tablet (50 mg total) by mouth every 6 (six) hours as needed. 05/08/15   Virgel Manifold, MD   BP 121/74 mmHg  Pulse 53  Temp(Src) 98.4 F (36.9 C) (  Oral)  Resp 16  Ht 5\' 10"  (1.778 m)  Wt 149 lb (67.586 kg)  BMI 21.38 kg/m2  SpO2 95% Physical Exam  Constitutional: He appears well-developed and well-nourished. No distress.  HENT:  Head: Normocephalic and atraumatic.  Eyes: Conjunctivae are normal. Right eye exhibits no discharge. Left eye exhibits no discharge.  Neck: Neck supple.  Cardiovascular: Normal rate, regular rhythm and normal heart sounds.  Exam reveals no gallop and no friction rub.   No murmur heard. Pulmonary/Chest: Effort normal and breath sounds normal. No respiratory distress.  Abdominal: Soft. He exhibits no  distension. There is tenderness.  Abdomen is soft. Very mild, diffuse tenderness without rebound or guarding. No distention.  Musculoskeletal: He exhibits no edema or tenderness.  Neurological: He is alert.  Skin: Skin is warm and dry.  Psychiatric: He has a normal mood and affect. His behavior is normal. Thought content normal.  Nursing note and vitals reviewed.   ED Course  Procedures (including critical care time) Labs Review Labs Reviewed  URINALYSIS, ROUTINE W REFLEX MICROSCOPIC (NOT AT [facility]) - Abnormal; Notable for the following:    Specific Gravity, Urine 1.004 (*)    All other components within normal limits  LIPASE, BLOOD - Abnormal; Notable for the following:    Lipase 14 (*)    All other components within normal limits  CBC WITH DIFFERENTIAL/PLATELET  COMPREHENSIVE METABOLIC PANEL    Imaging Review No results found.   EKG Interpretation None      MDM   Final diagnoses:  Abdominal pain, unspecified abdominal location  Anorexia    3 old male with ongoing abdominal pain and loose stools. Fairly benign exam. Workup pretty unremarkable. Low suspicion for emergent process. Discussed need to follow-up with either PCP or gastroenterologist for further evaluation. Return precautions were discussed.    Virgel Manifold, MD 05/09/15 [phone]

## 2015-05-16 ENCOUNTER — Encounter: Payer: Self-pay | Admitting: Family Medicine

## 2015-05-16 ENCOUNTER — Ambulatory Visit: Payer: Self-pay | Attending: Family Medicine | Admitting: Family Medicine

## 2015-05-16 ENCOUNTER — Telehealth: Payer: Self-pay | Admitting: Family Medicine

## 2015-05-16 ENCOUNTER — Encounter: Payer: Self-pay | Admitting: Gastroenterology

## 2015-05-16 VITALS — BP 105/65 | HR 61 | Temp 98.4°F | Resp 16 | Ht 70.0 in | Wt 147.0 lb

## 2015-05-16 DIAGNOSIS — R634 Abnormal weight loss: Secondary | ICD-10-CM | POA: Insufficient documentation

## 2015-05-16 DIAGNOSIS — R109 Unspecified abdominal pain: Secondary | ICD-10-CM

## 2015-05-16 DIAGNOSIS — R197 Diarrhea, unspecified: Secondary | ICD-10-CM | POA: Insufficient documentation

## 2015-05-16 DIAGNOSIS — K219 Gastro-esophageal reflux disease without esophagitis: Secondary | ICD-10-CM | POA: Insufficient documentation

## 2015-05-16 DIAGNOSIS — R1084 Generalized abdominal pain: Secondary | ICD-10-CM

## 2015-05-16 LAB — HEMOCCULT GUIAC POC 1CARD (OFFICE): Fecal Occult Blood, POC: NEGATIVE

## 2015-05-16 MED ORDER — DICYCLOMINE HCL 10 MG PO CAPS: 10.0000 mg | ORAL_CAPSULE | Freq: Three times a day (TID) | ORAL | Status: DC

## 2015-05-16 MED ORDER — SUCRALFATE 1 G PO TABS: 1.0000 g | ORAL_TABLET | Freq: Three times a day (TID) | ORAL | Status: DC

## 2015-05-16 MED ORDER — RANITIDINE HCL 150 MG PO TABS: 150.0000 mg | ORAL_TABLET | Freq: Two times a day (BID) | ORAL | Status: DC

## 2015-05-16 MED ORDER — TRAMADOL HCL 50 MG PO TABS: 50.0000 mg | ORAL_TABLET | Freq: Three times a day (TID) | ORAL | Status: DC | PRN

## 2015-05-16 NOTE — Telephone Encounter (Signed)
Please call patient  Attempted to call patient to ask that he not take rapaflo for next 4 weeks to determine if medication is contributing to diarrhea and abdominal pain.

## 2015-05-16 NOTE — Assessment & Plan Note (Signed)
GERD: Zantac ordered carafate ordered

## 2015-05-16 NOTE — Progress Notes (Signed)
ED F/U diarrhea and wt loss Stated has Hx polips on colon

## 2015-05-16 NOTE — Telephone Encounter (Signed)
Advised pt to hold for 4 weeks Rx Rapaflo Stated been taking medication for almost one year, will hold med

## 2015-05-16 NOTE — Assessment & Plan Note (Addendum)
A: Diarrhea with weight loss and abdominal pain. Heme negative stools.  DDx post infectious diarrhea, post-anbiotic diarrhea, r/o malignancy. Medication side effect, patient taking rapaflo.  P: Start probiotics either in food like yogurt or juice or supplements Bentyl refilled Tramadol refilled Asked patient to hold rapaflo for next 4 weeks

## 2015-05-16 NOTE — Progress Notes (Signed)
   Subjective:    Patient ID: Jeffrey Prince, male    DOB: 31-Jan-1966, 49 y.o.   MRN: [id number] CC; diarrhea and weight loss  HPI  49 yo M smoker with hx of hemorrhoids presents for f/u:  1. Diarrhea: off and on x 2 months with diffuse abdominal pain and weight loss. Pain is worsened by eating solid foods. Patient has sweats but no chills. No blood in stool. No fever or chills. No ETOH or illicit drug use. Patient last had antibiotics, clindamycin, in 01/2015 for facial cellulitis. He went to ED  One week ago and was given bentyl and tramdaol which helped with abdominal pain. He has tried lomotil which has helped a little. He admits to heartburn and taking OTC antacids.    Current Outpatient Prescriptions on File Prior to Visit  Medication Sig Dispense Refill  . silodosin (RAPAFLO) 4 MG CAPS capsule Take 4 mg by mouth daily with breakfast.     No current facility-administered medications on file prior to visit.    Soc Hx: current smoker  Review of Systems  Constitutional: Positive for unexpected weight change. Negative for fever, chills and fatigue.  Eyes: Negative for visual disturbance.  Respiratory: Negative for cough and shortness of breath.   Cardiovascular: Negative for chest pain, palpitations and leg swelling.  Gastrointestinal: Positive for abdominal pain and diarrhea. Negative for nausea, vomiting, constipation, blood in stool, abdominal distention, anal bleeding and rectal pain.  Musculoskeletal: Negative for myalgias, back pain, arthralgias, gait problem and neck pain.  Skin: Negative for rash.      Objective:   Physical Exam BP 105/65 mmHg  Pulse 61  Temp(Src) 98.4 F (36.9 C) (Oral)  Resp 16  Ht 5\' 10"  (1.778 m)  Wt 147 lb (66.679 kg)  BMI 21.09 kg/m2  SpO2 100%  Wt Readings from Last 3 Encounters:  05/16/15 147 lb (66.679 kg)  05/08/15 149 lb (67.586 kg)  01/24/15 163 lb (73.936 kg)  General appearance: alert, cooperative and no distress Lungs: clear to  auscultation bilaterally Heart: regular rate and rhythm, S1, S2 normal, no murmur, click, rub or gallop Abdomen: flat, soft, mild TTP diffusely w/o mass, rebound or guarding   Rectal: normal external exam.  normal tone, normal prostate, no stool in rectal vault. Heme negative swab.      Assessment & Plan:

## 2015-05-16 NOTE — Patient Instructions (Signed)
Mr. Dejarnette,  Thank you for coming in today   1. Diarrhea with weight loss and abdominal pain: Start probiotics either in food like yogurt or juice or supplements Bentyl refilled Tramadol refilled  2. GERD: Zantac ordered carafate ordered  GI referral placed today. Apply for orange card if you have not already done so.   F.u in 4 weeks for abdominal pain with diarrhea and weight loss  Dr. Adrian Blackwater

## 2015-05-24 ENCOUNTER — Telehealth: Payer: Self-pay | Admitting: Family Medicine

## 2015-05-24 ENCOUNTER — Other Ambulatory Visit: Payer: Self-pay | Admitting: *Deleted

## 2015-05-24 DIAGNOSIS — R197 Diarrhea, unspecified: Secondary | ICD-10-CM

## 2015-05-24 DIAGNOSIS — R1084 Generalized abdominal pain: Secondary | ICD-10-CM

## 2015-05-24 MED ORDER — DIPHENOXYLATE-ATROPINE 2.5-0.025 MG PO TABS: 1.0000 | ORAL_TABLET | Freq: Four times a day (QID) | ORAL | Status: DC | PRN

## 2015-05-24 NOTE — Telephone Encounter (Signed)
Pt advised to stop Bentyl, Lomotil at [place]

## 2015-05-24 NOTE — Telephone Encounter (Signed)
Patient called stating that he is still having abdominal pain and diarreah, please f/u.

## 2015-05-24 NOTE — Telephone Encounter (Signed)
Please call patient,  Advised patient try lomotil rx sent to on site pharmacy  Stop bentyl since it is not helping.

## 2015-05-25 ENCOUNTER — Other Ambulatory Visit: Payer: Self-pay | Admitting: Family Medicine

## 2015-05-25 MED ORDER — LOPERAMIDE HCL 2 MG PO CAPS: 2.0000 mg | ORAL_CAPSULE | ORAL | Status: DC | PRN

## 2015-06-01 ENCOUNTER — Telehealth: Payer: Self-pay | Admitting: Family Medicine

## 2015-06-01 NOTE — Telephone Encounter (Signed)
Pt. Was prescribed medication for stomach illness and states that medication is not helping. He is not feeling better. Please call patient

## 2015-06-06 NOTE — Telephone Encounter (Signed)
Patient has been referred to GI Has been tried on many meds without significant relief, recommend OTC stomach aid like pepto bismol and maalox until he sees GI

## 2015-06-13 ENCOUNTER — Ambulatory Visit: Payer: Self-pay | Admitting: Family Medicine

## 2015-06-14 NOTE — Telephone Encounter (Signed)
LVM to return call.

## 2015-07-13 ENCOUNTER — Ambulatory Visit: Payer: Self-pay | Admitting: Gastroenterology

## 2015-08-28 ENCOUNTER — Encounter: Payer: Self-pay | Admitting: Emergency Medicine

## 2015-08-28 ENCOUNTER — Emergency Department
Admission: EM | Admit: 2015-08-28 | Discharge: 2015-08-28 | Disposition: A | Discharge status: Home / Self Care | Payer: Self-pay | Attending: Emergency Medicine | Admitting: Emergency Medicine

## 2015-08-28 DIAGNOSIS — R1084 Generalized abdominal pain: Secondary | ICD-10-CM | POA: Insufficient documentation

## 2015-08-28 DIAGNOSIS — K219 Gastro-esophageal reflux disease without esophagitis: Secondary | ICD-10-CM

## 2015-08-28 DIAGNOSIS — G8929 Other chronic pain: Secondary | ICD-10-CM | POA: Insufficient documentation

## 2015-08-28 DIAGNOSIS — R112 Nausea with vomiting, unspecified: Secondary | ICD-10-CM | POA: Insufficient documentation

## 2015-08-28 DIAGNOSIS — R197 Diarrhea, unspecified: Secondary | ICD-10-CM | POA: Insufficient documentation

## 2015-08-28 DIAGNOSIS — Z79899 Other long term (current) drug therapy: Secondary | ICD-10-CM | POA: Insufficient documentation

## 2015-08-28 DIAGNOSIS — R1030 Lower abdominal pain, unspecified: Secondary | ICD-10-CM

## 2015-08-28 DIAGNOSIS — Z720 Tobacco use: Secondary | ICD-10-CM | POA: Insufficient documentation

## 2015-08-28 LAB — COMPREHENSIVE METABOLIC PANEL
ALT: 24 U/L (ref 17–63)
ANION GAP: 7 (ref 5–15)
AST: 21 U/L (ref 15–41)
Albumin: 4.4 g/dL (ref 3.5–5.0)
Alkaline Phosphatase: 60 U/L (ref 38–126)
BUN: 11 mg/dL (ref 6–20)
CO2: 26 mmol/L (ref 22–32)
Calcium: 9.3 mg/dL (ref 8.9–10.3)
Chloride: 105 mmol/L (ref 101–111)
Creatinine, Ser: 0.88 mg/dL (ref 0.61–1.24)
GFR calc Af Amer: >60 mL/min (ref >60)
GFR calc non Af Amer: >60 mL/min (ref >60)
Glucose, Bld: 73 mg/dL (ref 65–99)
POTASSIUM: 4.1 mmol/L (ref 3.5–5.1)
SODIUM: 138 mmol/L (ref 135–145)
Total Bilirubin: 0.9 mg/dL (ref 0.3–1.2)
Total Protein: 8.5 g/dL — ABNORMAL HIGH (ref 6.5–8.1)

## 2015-08-28 LAB — CBC
HCT: 47.1 % (ref 40.0–52.0)
HEMOGLOBIN: 15.4 g/dL (ref 13.0–18.0)
MCH: 28.4 pg (ref 26.0–34.0)
MCHC: 32.8 g/dL (ref 32.0–36.0)
MCV: 86.7 fL (ref 80.0–100.0)
Platelets: 206 10*3/uL (ref 150–440)
RBC: 5.43 MIL/uL (ref 4.40–5.90)
RDW: 14.1 % (ref 11.5–14.5)
WBC: 5.2 10*3/uL (ref 3.8–10.6)

## 2015-08-28 LAB — URINALYSIS COMPLETE WITH MICROSCOPIC (ARMC ONLY)
Bilirubin Urine: NEGATIVE
Glucose, UA: NEGATIVE mg/dL
KETONES UR: NEGATIVE mg/dL
LEUKOCYTES UA: NEGATIVE
Nitrite: NEGATIVE
PH: 5 (ref 5.0–8.0)
PROTEIN: NEGATIVE mg/dL
Specific Gravity, Urine: 1.015 (ref 1.005–1.030)

## 2015-08-28 LAB — LIPASE, BLOOD: LIPASE: 19 U/L (ref 11–51)

## 2015-08-28 MED ORDER — DICYCLOMINE HCL 10 MG PO CAPS: 10.0000 mg | ORAL_CAPSULE | Freq: Three times a day (TID) | ORAL | Status: DC

## 2015-08-28 MED ORDER — LOPERAMIDE HCL 2 MG PO CAPS: 2.0000 mg | ORAL_CAPSULE | ORAL | Status: DC | PRN

## 2015-08-28 MED ORDER — RANITIDINE HCL 150 MG PO TABS: 150.0000 mg | ORAL_TABLET | Freq: Two times a day (BID) | ORAL | Status: DC

## 2015-08-28 MED ORDER — SUCRALFATE 1 G PO TABS: 1.0000 g | ORAL_TABLET | Freq: Three times a day (TID) | ORAL | Status: DC

## 2015-08-28 NOTE — ED Provider Notes (Signed)
[facility] Emergency Department Provider Note  ____________________________________________  Time seen: Approximately 2:12 PM  I have reviewed the triage vital signs and the nursing notes.   HISTORY  Chief Complaint Abdominal Pain and Diarrhea    HPI Jeffrey Prince is a 49 y.o. male with a history of chronic GERD, and chronic lower abdominal pain and diarrhea presenting with abdominal pain and diarrhea.Patient reports that he's been having chronic lower abdominal pain for over 3 years. He has followed in [place] for this in the past and has been on Carafate, Bentyl, and ranitidine.  His medications have run out and the last time he saw provider was in July. Over the last several days he reports increased pain with 2 daily episodes of loose nonbloody non-watery stools. He has occasional nausea and has had very intermittent vomiting. None over the last 2-3 days. He denies any fever, scrotal or penile pain, travel outside the [place], camping.   Past Medical History  Diagnosis Date  . Blind left eye     Since birth    Patient Active Problem List   Diagnosis Date Noted  . Unintentional weight loss 05/16/2015  . Diarrhea 05/16/2015  . GERD (gastroesophageal reflux disease) 05/16/2015  . Diffuse abdominal pain 05/16/2015  . Dyslipidemia (high LDL; low HDL) 01/25/2015  . Decreased visual acuity 01/24/2015  . Erectile dysfunction 01/24/2015  . Current every day smoker 01/24/2015  . 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 Colonoscopy      with polyp removal  . Rotator cuff repair    . Hemorrhoid surgery      Current Outpatient Rx  Name  Route  Sig  Dispense  Refill  . dicyclomine (BENTYL) 10 MG capsule   Oral   Take 1 capsule (10 mg total) by mouth 4 (four) times daily -  before meals and at bedtime.   30 capsule   0   . loperamide (IMODIUM) 2 MG capsule   Oral   Take 1  capsule (2 mg total) by mouth as needed for diarrhea or loose stools (Take 4mg  after first loose stool, then 2mg  after each loos stool.  Max 8mg  daily.).   15 capsule   0   . ranitidine (ZANTAC) 150 MG tablet   Oral   Take 1 tablet (150 mg total) by mouth 2 (two) times daily.   30 tablet   0   . silodosin (RAPAFLO) 4 MG CAPS capsule   Oral   Take 4 mg by mouth daily with breakfast.         . sucralfate (CARAFATE) 1 G tablet   Oral   Take 1 tablet (1 g total) by mouth 4 (four) times daily -  with meals and at bedtime.   60 tablet   0   . traMADol (ULTRAM) 50 MG tablet   Oral   Take 1 tablet (50 mg total) by mouth every 8 (eight) hours as needed.   30 tablet   0     Allergies Bee venom and Codeine  Family History  Problem Relation Age of Onset  . Cancer Mother   . Cancer Paternal Uncle     Social History Social History  Substance Use Topics  . Smoking status: Current Every Day Smoker -- 0.50 packs/day    Types: Cigarettes  . Smokeless tobacco: Never Used  . Alcohol Use: No    Review of Systems Constitutional: No fever/chills. No  lightheadedness. No syncope. Eyes: No visual changes. ENT: No sore throat. Cardiovascular: Denies chest pain, palpitations. Respiratory: Denies shortness of breath.  No cough. Gastrointestinal: Positive abdominal pain.  Positive nausea and vomiting.  Positive diarrhea.  No constipation. Genitourinary: Negative for dysuria. Musculoskeletal: Negative for back pain. Skin: Negative for rash. Neurological: Negative for headaches, focal weakness or numbness.  10-point ROS otherwise negative.  ____________________________________________   PHYSICAL EXAM:  VITAL SIGNS: ED Triage Vitals  Enc Vitals Group     BP 08/28/15 1104 118/78 mmHg     Pulse Rate 08/28/15 1104 58     Resp 08/28/15 1104 18     Temp 08/28/15 1104 97.7 F (36.5 C)     Temp Source 08/28/15 1104 Oral     SpO2 08/28/15 1104 99 %     Weight 08/28/15 1104 147 lb  (66.679 kg)     Height 08/28/15 1104 5\' 10"  (1.778 m)     Head Cir --      Peak Flow --      Pain Score 08/28/15 1049 8     Pain Loc --      Pain Edu? --      Excl. in [place]? --     Constitutional: Alert and oriented. Well appearing and in no acute distress. Answer question appropriately. Eyes: Conjunctivae are normal.  EOMI. Head: Atraumatic. Nose: No congestion/rhinnorhea. Mouth/Throat: Mucous membranes are moist.  Neck: No stridor.  Supple.   Cardiovascular: Normal rate, regular rhythm. No murmurs, rubs or gallops.  Respiratory: Normal respiratory effort.  No retractions. Lungs CTAB.  No wheezes, rales or ronchi. Gastrointestinal: Abdomen is soft, nondistended and mildlt tender to palpation diffusely without focality in the lower abdomen. There is no palpable bladder or masses. He does not have guarding or rebound. There are no peritoneal signs. Musculoskeletal: No LE edema.  Neurologic:  Normal speech and language. No gross focal neurologic deficits are appreciated.  Skin:  Skin is warm, dry and intact. No rash noted. Normal skin turgor Psychiatric: Mood and affect are normal. Speech and behavior are normal.  Normal judgement.  ____________________________________________   LABS (all labs ordered are listed, but only abnormal results are displayed)  Labs Reviewed  COMPREHENSIVE METABOLIC PANEL - Abnormal; Notable for the following:    Total Protein 8.5 (*)    All other components within normal limits  URINALYSIS COMPLETEWITH MICROSCOPIC ([facility] ONLY) - Abnormal; Notable for the following:    Color, Urine YELLOW (*)    APPearance CLEAR (*)    Hgb urine dipstick 1+ (*)    Bacteria, UA MANY (*)    Squamous Epithelial / LPF 0-5 (*)    All other components within normal limits  LIPASE, BLOOD  CBC   ____________________________________________  EKG  Not indicated ____________________________________________  RADIOLOGY  No results  found.  ____________________________________________   PROCEDURES  Procedure(s) performed: None  Critical Care performed: No ____________________________________________   INITIAL IMPRESSION / ASSESSMENT AND PLAN / ED COURSE  Pertinent labs & imaging results that were available during my care of the patient were reviewed by me and considered in my medical decision making (see chart for details).  49 y.o. male with a history of chronic pain, recently off of all of his medications, presenting with continued chronic abdominal pain with twice daily episodes of diarrhea and intermittent nausea and vomiting. His vital signs are stable, he is afebrile, and on my exam he has a reassuring abdominal exam. From triage, he has labs which do not show  any acute pathology. At this time, I think he has continued symptoms of his chronic illness that are now not well controlled because he has not been taking his medications. I do not think he has acute surgical pathology. Imaging is not warranted. I will plan to treat the patient's symptoms, refill his prescriptions, and give them information about follow-up at a local primary care physician's office. Plan discharge.  ____________________________________________  FINAL CLINICAL IMPRESSION(S) / ED DIAGNOSES  Final diagnoses:  Lower abdominal pain  Diarrhea, unspecified type  Non-intractable vomiting with nausea, vomiting of unspecified type      NEW MEDICATIONS STARTED DURING THIS VISIT:  New Prescriptions   No medications on file     Eula Listen, MD 08/28/15 1514

## 2015-08-28 NOTE — ED Notes (Signed)
Pt states intermittent abd pain for months, states he was seen at [place] [facility] and given some RX, states an episode of loose stool this AM, pt awake and alert in no acute distress

## 2015-08-28 NOTE — Discharge Instructions (Signed)
Please drink plenty of fluid to stay well hydrated. Please call the [facility] clinic to establish a primary care physician to discuss your chronic abdominal pain and diarrhea.  Please return to the emergency department if he develops severe pain, fever, inability to keep down fluids, or any other symptoms concerning to you.    Abdominal Pain, Adult Many things can cause abdominal pain. Usually, abdominal pain is not caused by a disease and will improve without treatment. It can often be observed and treated at home. Your health care provider will do a physical exam and possibly order blood tests and X-rays to help determine the seriousness of your pain. However, in many cases, more time must pass before a clear cause of the pain can be found. Before that point, your health care provider may not know if you need more testing or further treatment. HOME CARE INSTRUCTIONS Monitor your abdominal pain for any changes. The following actions may help to alleviate any discomfort you are experiencing:  Only take over-the-counter or prescription medicines as directed by your health care provider.  Do not take laxatives unless directed to do so by your health care provider.  Try a clear liquid diet (broth, tea, or water) as directed by your health care provider. Slowly move to a bland diet as tolerated. SEEK MEDICAL CARE IF:  You have unexplained abdominal pain.  You have abdominal pain associated with nausea or diarrhea.  You have pain when you urinate or have a bowel movement.  You experience abdominal pain that wakes you in the night.  You have abdominal pain that is worsened or improved by eating food.  You have abdominal pain that is worsened with eating fatty foods.  You have a fever. SEEK IMMEDIATE MEDICAL CARE IF:  Your pain does not go away within 2 hours.  You keep throwing up (vomiting).  Your pain is felt only in portions of the abdomen, such as the right side or the left lower  portion of the abdomen.  You pass bloody or black tarry stools. MAKE SURE YOU:  Understand these instructions.  Will watch your condition.  Will get help right away if you are not doing well or get worse.   This information is not intended to replace advice given to you by your health care provider. Make sure you discuss any questions you have with your health care provider.   Document Released: 07/31/2005 Document Revised: 07/12/2015 Document Reviewed: 06/30/2013 Elsevier Interactive Patient Education [employer].

## 2015-08-28 NOTE — ED Notes (Signed)
abd pain and dirarrhea.  For months.  Comes a goes.

## 2015-10-02 ENCOUNTER — Other Ambulatory Visit: Payer: Self-pay | Admitting: Family Medicine

## 2015-11-13 ENCOUNTER — Ambulatory Visit: Payer: Self-pay | Admitting: Family Medicine

## 2015-11-24 ENCOUNTER — Ambulatory Visit: Payer: Self-pay | Admitting: Family Medicine

## 2015-12-14 ENCOUNTER — Encounter: Payer: Self-pay | Admitting: Family Medicine

## 2015-12-14 ENCOUNTER — Encounter: Payer: Self-pay | Admitting: Gastroenterology

## 2015-12-14 ENCOUNTER — Ambulatory Visit: Payer: BLUE CROSS/BLUE SHIELD | Attending: Family Medicine | Admitting: Family Medicine

## 2015-12-14 VITALS — BP 124/72 | HR 93 | Temp 98.1°F | Resp 16 | Ht 70.0 in | Wt 139.0 lb

## 2015-12-14 DIAGNOSIS — R634 Abnormal weight loss: Secondary | ICD-10-CM | POA: Diagnosis present

## 2015-12-14 DIAGNOSIS — F17210 Nicotine dependence, cigarettes, uncomplicated: Secondary | ICD-10-CM | POA: Diagnosis not present

## 2015-12-14 DIAGNOSIS — N529 Male erectile dysfunction, unspecified: Secondary | ICD-10-CM

## 2015-12-14 DIAGNOSIS — Z23 Encounter for immunization: Secondary | ICD-10-CM

## 2015-12-14 DIAGNOSIS — F17200 Nicotine dependence, unspecified, uncomplicated: Secondary | ICD-10-CM | POA: Diagnosis not present

## 2015-12-14 DIAGNOSIS — R109 Unspecified abdominal pain: Secondary | ICD-10-CM | POA: Diagnosis not present

## 2015-12-14 DIAGNOSIS — R1084 Generalized abdominal pain: Secondary | ICD-10-CM | POA: Diagnosis not present

## 2015-12-14 DIAGNOSIS — K219 Gastro-esophageal reflux disease without esophagitis: Secondary | ICD-10-CM | POA: Insufficient documentation

## 2015-12-14 DIAGNOSIS — N4281 Prostatodynia syndrome: Secondary | ICD-10-CM

## 2015-12-14 DIAGNOSIS — Z79899 Other long term (current) drug therapy: Secondary | ICD-10-CM | POA: Insufficient documentation

## 2015-12-14 LAB — HEMOCCULT GUIAC POC 1CARD (OFFICE): Fecal Occult Blood, POC: NEGATIVE

## 2015-12-14 MED ORDER — HYOSCYAMINE SULFATE 0.125 MG SL SUBL: 0.1250 mg | SUBLINGUAL_TABLET | SUBLINGUAL | Status: DC | PRN

## 2015-12-14 MED ORDER — RANITIDINE HCL 150 MG PO TABS: 150.0000 mg | ORAL_TABLET | Freq: Two times a day (BID) | ORAL | Status: DC

## 2015-12-14 MED ORDER — HYOSCYAMINE SULFATE 0.125 MG PO TABS: 0.1250 mg | ORAL_TABLET | ORAL | Status: DC | PRN

## 2015-12-14 MED ORDER — TADALAFIL 20 MG PO TABS: 10.0000 mg | ORAL_TABLET | ORAL | Status: DC | PRN

## 2015-12-14 MED FILL — HYOSCYAMINE 0.125 MG TAB SL: 0.125 | 8 days supply | Qty: 30 | Fill #0

## 2015-12-14 MED FILL — raNITIdine HCL 150 MG TABS: 150 | 15 days supply | Qty: 30 | Fill #0

## 2015-12-14 NOTE — Progress Notes (Signed)
Subjective:  Patient ID: Jeffrey Prince, male    DOB: 22-Mar-1966  Age: 50 y.o. MRN: [id number]  CC: Weight Loss; Abdominal Cramping; and Nicotine Dependence   HPI Jeffrey Prince presents for   1. Abdominal cramping: off and on x 7  months with weight loss. Pain is worsened by eating solid foods. Patient has sweats but no chills. No blood in stool. No fever or chills. No ETOH or illicit drug use. Patient last had antibiotics, clindamycin, in 01/2015 for facial cellulitis. He initially reported improvement with bentyl but says it no longer helps. He has not gone to GI due to lack of insurance coverage. He now has insurance. He denies nausea, emesis and blood in stool.  2. Erectile dysfunction: viagra has not helped much. He is requesting prostate check. He denies dysuria, frequency and hesitancy. He is now longer taking rapaflo.    Social History  Substance Use Topics  . Smoking status: Current Every Day Smoker -- 0.50 packs/day    Types: Cigarettes  . Smokeless tobacco: Never Used  . Alcohol Use: No    Outpatient Prescriptions Prior to Visit  Medication Sig Dispense Refill  . dicyclomine (BENTYL) 10 MG capsule Take 1 capsule (10 mg total) by mouth 4 (four) times daily -  before meals and at bedtime. (Patient not taking: Reported on 12/14/2015) 30 capsule 0  . loperamide (IMODIUM) 2 MG capsule Take 1 capsule (2 mg total) by mouth as needed for diarrhea or loose stools (Take 4mg  after first loose stool, then 2mg  after each loos stool.  Max 8mg  daily.). (Patient not taking: Reported on 12/14/2015) 15 capsule 0  . ranitidine (ZANTAC) 150 MG tablet Take 1 tablet (150 mg total) by mouth 2 (two) times daily. (Patient not taking: Reported on 12/14/2015) 30 tablet 0  . RAPAFLO 4 MG CAPS capsule TAKE ONE CAPSULE BY MOUTH EVERY DAY. (Patient not taking: Reported on 12/14/2015) 90 capsule 1  . sucralfate (CARAFATE) 1 G tablet Take 1 tablet (1 g total) by mouth 4 (four) times daily -  with meals and  at bedtime. (Patient not taking: Reported on 12/14/2015) 60 tablet 0  . traMADol (ULTRAM) 50 MG tablet Take 1 tablet (50 mg total) by mouth every 8 (eight) hours as needed. (Patient not taking: Reported on 12/14/2015) 30 tablet 0   No facility-administered medications prior to visit.    ROS Review of Systems  Constitutional: Negative for fever, chills, fatigue and unexpected weight change.  Eyes: Negative for visual disturbance.  Respiratory: Negative for cough and shortness of breath.   Cardiovascular: Negative for chest pain, palpitations and leg swelling.  Gastrointestinal: Positive for abdominal pain. Negative for nausea, vomiting, diarrhea, constipation and blood in stool.       Loose stools 2-3 time per day   Endocrine: Negative for polydipsia, polyphagia and polyuria.  Genitourinary: Negative for dysuria, urgency and frequency.  Musculoskeletal: Negative for myalgias, back pain, arthralgias, gait problem and neck pain.  Skin: Negative for rash.  Allergic/Immunologic: Negative for immunocompromised state.  Hematological: Negative for adenopathy. Does not bruise/bleed easily.  Psychiatric/Behavioral: Negative for suicidal ideas, sleep disturbance and dysphoric mood. The patient is not nervous/anxious.     Objective:  BP 124/72 mmHg  Pulse 93  Temp(Src) 98.1 F (36.7 C) (Oral)  Resp 16  Ht 5\' 10"  (1.778 m)  Wt 139 lb (63.05 kg)  BMI 19.94 kg/m2  SpO2 100%  BP/Weight 12/14/2015 08/28/2015 [id number]  Systolic BP [id number] [id number]  Diastolic BP  72 93 65  Wt. (Lbs) 139 147 147  BMI 19.94 21.09 21.09   Physical Exam  Constitutional: He appears well-developed and well-nourished. No distress.  HENT:  Head: Normocephalic and atraumatic.  Neck: Normal range of motion. Neck supple.  Cardiovascular: Normal rate, regular rhythm, normal heart sounds and intact distal pulses.   Pulmonary/Chest: Effort normal and breath sounds normal.  Abdominal: Soft. Bowel sounds are normal. He exhibits no  distension. There is no tenderness. There is no rebound and no guarding.  Genitourinary: Rectum normal. Rectal exam shows anal tone normal. Guaiac negative stool. Prostate is tender (slight tenderness ). Prostate is not enlarged.  No stool in rectal vault   Musculoskeletal: He exhibits no edema.  Neurological: He is alert.  Skin: Skin is warm and dry. No rash noted. No erythema.  Psychiatric: He has a normal mood and affect.     Assessment & Plan:  Jeffrey Prince was seen today for weight loss, abdominal cramping and nicotine dependence.  Diagnoses and all orders for this visit:  Diffuse abdominal pain -     Ambulatory referral to Gastroenterology -     hyoscyamine (LEVSIN, ANASPAZ) 0.125 MG tablet; Take 1 tablet (0.125 mg total) by mouth every 4 (four) hours as needed.  Gastroesophageal reflux disease, esophagitis presence not specified -     ranitidine (ZANTAC) 150 MG tablet; Take 1 tablet (150 mg total) by mouth 2 (two) times daily.  Tender prostate -     PSA -     Hemoccult - 1 Card (office)  Erectile dysfunction, unspecified erectile dysfunction type -     tadalafil (CIALIS) 20 MG tablet; Take 0.5-1 tablets (10-20 mg total) by mouth every other day as needed for erectile dysfunction.  Other orders -     Tdap vaccine greater than or equal to 7yo IM   Follow-up: No Follow-up on file.   Boykin Nearing MD

## 2015-12-14 NOTE — Assessment & Plan Note (Signed)
I suspect IBS Patient does continue to lose weight GI referral placed again Changed from bentyl to levsin Refilled zantac

## 2015-12-14 NOTE — Assessment & Plan Note (Signed)
No improvement with viagra cialis ordered

## 2015-12-14 NOTE — Patient Instructions (Addendum)
Diagnoses and all orders for this visit:  Diffuse abdominal pain -     Ambulatory referral to Gastroenterology -     hyoscyamine (LEVSIN, ANASPAZ) 0.125 MG tablet; Take 1 tablet (0.125 mg total) by mouth every 4 (four) hours as needed.  Gastroesophageal reflux disease, esophagitis presence not specified -     ranitidine (ZANTAC) 150 MG tablet; Take 1 tablet (150 mg total) by mouth 2 (two) times daily.  Tender prostate -     PSA  Erectile dysfunction, unspecified erectile dysfunction type -     tadalafil (CIALIS) 20 MG tablet; Take 0.5-1 tablets (10-20 mg total) by mouth every other day as needed for erectile dysfunction.  Other orders -     Tdap vaccine greater than or equal to 7yo IM   Smoking cessation support: smoking cessation hotline: 1-800-QUIT-NOW.  Smoking cessation classes are available through [facility]. Call [phone] or visit our website at [email or web address]   Try levsin for abdominal pains You will be called with GI appt  F/u in 8 weeks for abdominal pain   Dr. Adrian Blackwater

## 2015-12-14 NOTE — Progress Notes (Signed)
F/U abdominal pain and wt loss  Stated soft BM and pain on Lower abdominal area  Pain scale #8 Tobacco user-5 cigarette per day  No suicidal thoughts in the past two weeks

## 2015-12-15 LAB — PSA: PSA: 1.41 ng/mL (ref <=4.00)

## 2015-12-19 ENCOUNTER — Telehealth: Payer: Self-pay

## 2015-12-19 NOTE — Telephone Encounter (Signed)
CMA called patient, patient verified name and DOB. Patient was given lab results verbalized he understood with no further questions. 

## 2015-12-19 NOTE — Telephone Encounter (Signed)
-----   Message from Boykin Nearing, MD sent at 12/15/2015  8:39 AM EST ----- PSA normal

## 2015-12-20 ENCOUNTER — Ambulatory Visit: Payer: Self-pay | Admitting: Internal Medicine

## 2015-12-20 ENCOUNTER — Other Ambulatory Visit: Payer: Self-pay | Admitting: *Deleted

## 2015-12-20 ENCOUNTER — Other Ambulatory Visit: Payer: Self-pay

## 2015-12-20 DIAGNOSIS — N529 Male erectile dysfunction, unspecified: Secondary | ICD-10-CM

## 2015-12-22 MED ORDER — SILDENAFIL CITRATE 25 MG PO TABS: 25.0000 mg | ORAL_TABLET | Freq: Every day | ORAL | Status: DC | PRN

## 2016-01-12 ENCOUNTER — Ambulatory Visit (INDEPENDENT_AMBULATORY_CARE_PROVIDER_SITE_OTHER): Payer: BLUE CROSS/BLUE SHIELD | Admitting: Gastroenterology

## 2016-01-12 ENCOUNTER — Encounter: Payer: Self-pay | Admitting: Gastroenterology

## 2016-01-12 ENCOUNTER — Ambulatory Visit (INDEPENDENT_AMBULATORY_CARE_PROVIDER_SITE_OTHER)
Admission: RE | Admit: 2016-01-12 | Discharge: 2016-01-12 | Disposition: A | Discharge status: Home / Self Care | Payer: BLUE CROSS/BLUE SHIELD | Source: Ambulatory Visit | Attending: Gastroenterology | Admitting: Gastroenterology

## 2016-01-12 VITALS — BP 130/80 | HR 72 | Ht 70.0 in | Wt 138.0 lb

## 2016-01-12 DIAGNOSIS — R1084 Generalized abdominal pain: Secondary | ICD-10-CM

## 2016-01-12 DIAGNOSIS — R197 Diarrhea, unspecified: Secondary | ICD-10-CM

## 2016-01-12 DIAGNOSIS — R634 Abnormal weight loss: Secondary | ICD-10-CM

## 2016-01-12 DIAGNOSIS — F1990 Other psychoactive substance use, unspecified, uncomplicated: Secondary | ICD-10-CM

## 2016-01-12 DIAGNOSIS — R109 Unspecified abdominal pain: Secondary | ICD-10-CM

## 2016-01-12 DIAGNOSIS — F1991 Other psychoactive substance use, unspecified, in remission: Secondary | ICD-10-CM

## 2016-01-12 DIAGNOSIS — Z87898 Personal history of other specified conditions: Secondary | ICD-10-CM

## 2016-01-12 MED ORDER — HYOSCYAMINE SULFATE 0.125 MG SL SUBL: 0.1250 mg | SUBLINGUAL_TABLET | SUBLINGUAL | Status: DC | PRN

## 2016-01-12 NOTE — Patient Instructions (Addendum)
You have been given a separate informational sheet regarding your tobacco use, the importance of quitting and local resources to help you quit.   Please see your primary care doctor for continued workup of your weight loss. I will send them a copy of my note.  We have ordered you a chest xray. Please go to the lower level to have tat done today.  You have been scheduled for a colonoscopy. Please follow written instructions given to you at your visit today.  Please pick up your prep supplies at the pharmacy within the next 1-3 days. If you use inhalers (even only as needed), please bring them with you on the day of your procedure. However, you should still follow specific instructions given to you by our office regarding your preparation for the procedure.  Thank you for choosing [facility]  Dr Wilfrid Lund III

## 2016-01-12 NOTE — Progress Notes (Signed)
[facility] Consult Note:  History: Jeffrey Prince 01/12/2016  Referring physician: Minerva Ends, MD  Reason for consult/chief complaint: Abdominal Pain and Weight Loss   Subjective HPI:  This is the initial clinic visit for this 50 year old man referred by the PCP noted above. It sounds that he recently established primary care with them, after not having had insurance for years. He is a limited historian, but seems to describe at least 7 or 8 years of chronic generalized abdominal pain. It sounds it is mostly bandlike and lower, but at times can be scattered and migratory. He typically has 2 semi-formed to loose nonbloody BMs per day. This has been his pattern for many years. Her some occasional painless bleeding that he attributes to hemorrhoids. He reports that he saw [facility], and then a couple of years ago saw someone in [place]. He colonoscopy on the most recent occasion but reportedly had polyps. We have asked him to sign a release for records. He was given some Bentyl that did not help, he has had some improvement in the pain and diarrhea from her recent trial of Levsin The more bothersome symptom lately is about 3 or 4 months of 15-20 pounds of unintentional weight loss. It sounds like his appetite is variable for unclear reasons.  ROS:  Review of Systems  Constitutional: Positive for unexpected weight change. Negative for appetite change.  HENT: Negative for mouth sores and voice change.   Eyes: Negative for pain and redness.  Respiratory: Negative for cough and shortness of breath.   Cardiovascular: Negative for chest pain and palpitations.  Endocrine: Negative for polydipsia and polyphagia.  Genitourinary: Negative for dysuria and hematuria.  Musculoskeletal: Negative for myalgias and arthralgias.  Skin: Negative for pallor and rash.  Neurological: Negative for weakness and headaches.  Hematological: Negative for adenopathy.   denies  hemoptysis   Past Medical History: Past Medical History  Diagnosis Date  . Blind left eye     Since birth     Past Surgical History: Past Surgical History  Procedure Laterality Date  . Colonoscopy      with polyp removal  . Rotator cuff repair    . Hemorrhoid surgery       Family History: Family History  Problem Relation Age of Onset  . Cancer Mother   . Cancer Paternal Uncle    He believes that his maternal grandfather and 2 paternal uncles had colon cancer Social History: Social History   Social History  . Marital Status: Single    Spouse Name: N/A  . Number of Children: N/A  . Years of Education: N/A   Social History Main Topics  . Smoking status: Current Every Day Smoker -- 0.50 packs/day    Types: Cigarettes  . Smokeless tobacco: Never Used  . Alcohol Use: No  . Drug Use: No  . Sexual Activity: Not Asked   Other Topics Concern  . None   Social History Narrative   He also reveals to smoke marijuana He denied IV drug use Allergies: Allergies  Allergen Reactions  . Bee Venom Swelling  . Codeine Nausea Only    Outpatient Meds: Current Outpatient Prescriptions  Medication Sig Dispense Refill  . hyoscyamine (LEVSIN SL) 0.125 MG SL tablet Place 1 tablet (0.125 mg total) under the tongue every 4 (four) hours as needed. 60 tablet 2  . ranitidine (ZANTAC) 150 MG tablet Take 1 tablet (150 mg total) by mouth 2 (two) times daily. (Patient not taking: Reported on  01/12/2016) 30 tablet 5  . sildenafil (VIAGRA) 25 MG tablet Take 1 tablet (25 mg total) by mouth daily as needed for erectile dysfunction. (Patient not taking: Reported on 01/12/2016) 10 tablet 0   No current facility-administered medications for this visit.      ___________________________________________________________________ Objective  Exam:  BP 130/80 mmHg  Pulse 72  Ht 5\' 10"  (1.778 m)  Wt 138 lb (62.596 kg)  BMI 19.80 kg/m2   General: this is a thin middle-aged African-American  man with an odd affect   Eyes: sclera anicteric, no redness  ENT: oral mucosa moist without lesions, no cervical or supraclavicular lymphadenopathy, good dentition  CV: RRR without murmur, S1/S2, no JVD, no peripheral edema  Resp: clear to auscultation bilaterally, normal RR and effort noted  GI: soft, mild diffuse tenderness, with active bowel sounds. No guarding or palpable organomegaly noted.  Skin; warm and dry, no rash or jaundice noted  Neuro: awake, alert and oriented x 3. Normal gross motor function and fluent speech  Labs:  CBC/CMP (ncl albumin) normal Oct 2016  Radiologic Studies: None recent  Assessment: Encounter Diagnoses  Name Primary?  . Generalized abdominal pain Yes  . Diarrhea, unspecified type   . Abnormal loss of weight   . Diffuse abdominal pain     The patient's symptoms are very difficult to characterize, however the duration of the generalized abdominal pain and diarrhea along with reduced endoscopic workup implies a probable functional disorder. 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 Therefore, the weight loss is probably unrelated to digestive symptoms.  Plan:  EGD/colonoscopy to be sure he does not have an apparent digestive source for weight loss. The benefits and risks of the planned procedure were described in detail with the patient or (when appropriate) their health care proxy.  Risks were outlined as including, but not limited to, bleeding, infection, perforation, adverse medication reaction leading to cardiac or pulmonary decompensation, or pancreatitis (if ERCP).  The limitation of incomplete mucosal visualization was also discussed.  No guarantees or warranties were given.   He also reports previous polyps and a family history of colorectal cancer, although all of them are second-degree relatives. His Levsin was refilled to reports that is helping  somewhat. He is also referred back to primary care to consider other possible causes weight loss. A PA/lateral chest x-ray was ordered today to begin that workup, since he is a smoker with weight loss. He reports that he has previously been tested for HIV  Thank you for the courtesy of this consult.  Please call me with any questions or concerns.  Nelida Meuse III

## 2016-01-15 ENCOUNTER — Telehealth: Payer: Self-pay | Admitting: Gastroenterology

## 2016-01-16 ENCOUNTER — Telehealth: Payer: Self-pay | Admitting: Gastroenterology

## 2016-01-16 MED ORDER — DICYCLOMINE HCL 10 MG PO CAPS: 10.0000 mg | ORAL_CAPSULE | Freq: Three times a day (TID) | ORAL | Status: DC

## 2016-01-16 NOTE — Telephone Encounter (Signed)
Pt has been notified that the prep is not a prescription, it is OTC.  He is aware and will pick that up today

## 2016-01-16 NOTE — Telephone Encounter (Signed)
PATIENT IS CALLING AGAIN, NEEDING THE PREP SOLUTION SENT TO HIS [facility] PT WOULD LIKE A CALL ONCE IT HAS BEEN SENT.

## 2016-01-16 NOTE — Telephone Encounter (Signed)
That is going to be tough, since I do not know what medicines they have available for him.  If they have dicyclomine, then 10 mg 3 times daily before meals.  Rx 90, RF #1

## 2016-01-16 NOTE — Telephone Encounter (Signed)
Prescription has been sent as requested and recommended by Dr Loletha Carrow

## 2016-01-17 NOTE — Telephone Encounter (Signed)
See previous phone note.  

## 2016-01-29 ENCOUNTER — Telehealth: Payer: Self-pay | Admitting: Gastroenterology

## 2016-01-29 NOTE — Telephone Encounter (Signed)
The pt purchased the large 500 gram bottle of miralax, he was advised to drink 1/2 of the bottle for the prep.  Pt agreed and will call back with any further questions.

## 2016-01-31 ENCOUNTER — Ambulatory Visit (AMBULATORY_SURGERY_CENTER): Payer: BLUE CROSS/BLUE SHIELD | Admitting: Gastroenterology

## 2016-01-31 ENCOUNTER — Encounter: Payer: Self-pay | Admitting: Gastroenterology

## 2016-01-31 VITALS — BP 122/73 | HR 44 | Temp 95.9°F | Resp 9 | Ht 70.0 in | Wt 138.0 lb

## 2016-01-31 DIAGNOSIS — R634 Abnormal weight loss: Secondary | ICD-10-CM | POA: Diagnosis present

## 2016-01-31 DIAGNOSIS — R1084 Generalized abdominal pain: Secondary | ICD-10-CM | POA: Diagnosis not present

## 2016-01-31 MED ORDER — SODIUM CHLORIDE 0.9 % IV SOLN: 500.0000 mL | INTRAVENOUS | Status: DC

## 2016-01-31 NOTE — Op Note (Signed)
[facility] Patient Name: Jeffrey Prince Procedure Date: 01/31/2016 1:25 PM MRN: [id number] Endoscopist: Mallie Mussel L. Loletha Carrow , MD Age: 50 Referring MD:  Date of Birth: 1966-07-27 Gender: Male Procedure:                Colonoscopy Indications:              Generalized abdominal pain, Weight loss Medicines:                Monitored Anesthesia Care Procedure:                Pre-Anesthesia Assessment:                           - Prior to the procedure, a History and Physical                            was performed, and patient medications and                            allergies were reviewed. The patient's tolerance of                            previous anesthesia was also reviewed. The risks                            and benefits of the procedure and the sedation                            options and risks were discussed with the patient.                            All questions were answered, and informed consent                            was obtained. Prior Anticoagulants: The patient has                            taken no previous anticoagulant or antiplatelet                            agents. ASA Grade Assessment: II - A patient with                            mild systemic disease. After reviewing the risks                            and benefits, the patient was deemed in                            satisfactory condition to undergo the procedure.                           - Prior to the procedure, a History and Physical  was performed, and patient medications and                            allergies were reviewed. The patient's tolerance of                            previous anesthesia was also reviewed. The risks                            and benefits of the procedure and the sedation                            options and risks were discussed with the patient.                            All questions were answered, and informed consent                             was obtained. Prior Anticoagulants: The patient has                            taken no previous anticoagulant or antiplatelet                            agents. ASA Grade Assessment: II - A patient with                            mild systemic disease. After reviewing the risks                            and benefits, the patient was deemed in                            satisfactory condition to undergo the procedure.                           After obtaining informed consent, the colonoscope                            was passed under direct vision. Throughout the                            procedure, the patient's blood pressure, pulse, and                            oxygen saturations were monitored continuously. The                            Model CF-HQ190L [id number]) scope was introduced                            through the anus and advanced to the the cecum,  identified by appendiceal orifice and ileocecal                            valve. The colonoscopy was performed without                            difficulty. The patient tolerated the procedure                            well. The quality of the bowel preparation was                            good. The ileocecal valve, appendiceal orifice, and                            rectum were photographed. The bowel preparation                            used was Miralax. The quality of the bowel                            preparation was evaluated using the BBPS [facility]                            Bowel Preparation Scale) with scores of: Right                            Colon = 2, Transverse Colon = 2 and Left Colon = 3.                            The total BBPS score equals 7. Scope In: 1:34:16 PM Scope Out: [id number] PM Scope Withdrawal Time: 0 hours 7 minutes 58 seconds  Total Procedure Duration: 0 hours 12 minutes 0 seconds  Findings:      The perianal and digital rectal  examinations were normal.      Diverticula were found in the sigmoid colon.      Retroflexion in the rectum was not performed due to anatomy. Complications:            No immediate complications. Estimated Blood Loss:     Estimated blood loss: none. Impression:               - Diverticulosis in the sigmoid colon.                           - No specimens collected.                           - No cause for weight loss was seen.                           The chronic abdominal pain appears to be                            functional, and dicyclomine has been prescribed. Recommendation:           -  Patient has a contact number available for                            emergencies. The signs and symptoms of potential                            delayed complications were discussed with the                            patient. Return to normal activities tomorrow.                            Written discharge instructions were provided to the                            patient.                           - Resume previous diet.                           - Continue present medications.                           - Repeat colonoscopy in 10 years for screening                            purposes.                           - Return to referring physician at the next                            available appointment to further work up weight                            loss. Procedure Code(s):        --- Professional ---                           [phone], Colonoscopy, flexible; diagnostic, including                            collection of specimen(s) by brushing or washing,                            when performed (separate procedure) CPT copyright 2016 American Medical Association. All rights reserved. Solene Hereford L. Loletha Carrow, MD 01/31/2016 1:55:35 PM This report has been signed electronically. Number of Addenda: 0 Referring MD:      Boykin Nearing

## 2016-01-31 NOTE — Progress Notes (Signed)
A/ox3, pleased with MAC, report to RN 

## 2016-01-31 NOTE — Patient Instructions (Signed)
Call to make an appointment to see your primary care provider at the next available appointment to further work up weight loss.  Normal exams today. Repeat colonoscopy in 10 years. Call us with any questions or concerns. Thank you!  YOU HAD AN ENDOSCOPIC PROCEDURE TODAY AT [facility]:   Refer to the procedure report that was given to you for any specific questions about what was found during the examination.  If the procedure report does not answer your questions, please call your gastroenterologist to clarify.  If you requested that your care partner not be given the details of your procedure findings, then the procedure report has been included in a sealed envelope for you to review at your convenience later.  YOU SHOULD EXPECT: Some feelings of bloating in the abdomen. Passage of more gas than usual.  Walking can help get rid of the air that was put into your GI tract during the procedure and reduce the bloating. If you had a lower endoscopy (such as a colonoscopy or flexible sigmoidoscopy) you may notice spotting of blood in your stool or on the toilet paper. If you underwent a bowel prep for your procedure, you may not have a normal bowel movement for a few days.  Please Note:  You might notice some irritation and congestion in your nose or some drainage.  This is from the oxygen used during your procedure.  There is no need for concern and it should clear up in a day or so.  SYMPTOMS TO REPORT IMMEDIATELY:   Following lower endoscopy (colonoscopy or flexible sigmoidoscopy):  Excessive amounts of blood in the stool  Significant tenderness or worsening of abdominal pains  Swelling of the abdomen that is new, acute  Fever of 100F or higher   Following upper endoscopy (EGD)  Vomiting of blood or coffee ground material  New chest pain or pain under the shoulder blades  Painful or persistently difficult swallowing  New shortness of breath  Fever of 100F or higher  Black,  tarry-looking stools  For urgent or emergent issues, a gastroenterologist can be reached at any hour by calling [phone].   DIET: Your first meal following the procedure should be a small meal and then it is ok to progress to your normal diet. Heavy or fried foods are harder to digest and may make you feel nauseous or bloated.  Likewise, meals heavy in dairy and vegetables can increase bloating.  Drink plenty of fluids but you should avoid alcoholic beverages for 24 hours.  ACTIVITY:  You should plan to take it easy for the rest of today and you should NOT DRIVE or use heavy machinery until tomorrow (because of the sedation medicines used during the test).    FOLLOW UP: Our staff will call the number listed on your records the next business day following your procedure to check on you and address any questions or concerns that you may have regarding the information given to you following your procedure. If we do not reach you, we will leave a message.  However, if you are feeling well and you are not experiencing any problems, there is no need to return our call.  We will assume that you have returned to your regular daily activities without incident.  If any biopsies were taken you will be contacted by phone or by letter within the next 1-3 weeks.  Please call us at [phone] if you have not heard about the biopsies in 3 weeks.  SIGNATURES/CONFIDENTIALITY: You and/or your care partner have signed paperwork which will be entered into your electronic medical record.  These signatures attest to the fact that that the information above on your After Visit Summary has been reviewed and is understood.  Full responsibility of the confidentiality of this discharge information lies with you and/or your care-partner.

## 2016-01-31 NOTE — Op Note (Signed)
[facility] Patient Name: Jeffrey Prince Procedure Date: 01/31/2016 1:24 PM MRN: [id number] Endoscopist: Jeffrey Prince , MD Age: 50 Referring MD:  Date of Birth: May 24, 1966 Gender: Male Procedure:                Upper GI endoscopy Indications:              Generalized abdominal pain, Weight loss Medicines:                Monitored Anesthesia Care Procedure:                Pre-Anesthesia Assessment:                           - Prior to the procedure, a History and Physical                            was performed, and patient medications and                            allergies were reviewed. The patient's tolerance of                            previous anesthesia was also reviewed. The risks                            and benefits of the procedure and the sedation                            options and risks were discussed with the patient.                            All questions were answered, and informed consent                            was obtained. Prior Anticoagulants: The patient has                            taken no previous anticoagulant or antiplatelet                            agents. ASA Grade Assessment: II - A patient with                            mild systemic disease. After reviewing the risks                            and benefits, the patient was deemed in                            satisfactory condition to undergo the procedure.                           After obtaining informed consent, the endoscope was  passed under direct vision. Throughout the                            procedure, the patient's blood pressure, pulse, and                            oxygen saturations were monitored continuously. The                            Model GIF-HQ190 [id number]) scope was introduced                            through the mouth, and advanced to the second part                            of duodenum. The upper GI endoscopy  was                            accomplished without difficulty. The patient                            tolerated the procedure well. Scope In: Scope Out: Findings:      The esophagus was normal.      The stomach was normal.      The examined duodenum was normal. Complications:            No immediate complications. Estimated Blood Loss:     Estimated blood loss: none. Impression:               - Normal esophagus.                           - Normal stomach.                           - Normal examined duodenum.                           - No specimens collected.                           - No cause for weight loss was found. Recommendation:           - Patient has a contact number available for                            emergencies. The signs and symptoms of potential                            delayed complications were discussed with the                            patient. Return to normal activities tomorrow.                            Written discharge instructions were provided to the  patient.                           - Resume previous diet.                           - Continue present medications.                           - No repeat upper endoscopy.                           - Return to primary care physician at the next                            available appointment. Procedure Code(s):        --- Professional ---                           [phone], Esophagogastroduodenoscopy, flexible,                            transoral; diagnostic, including collection of                            specimen(s) by brushing or washing, when performed                            (separate procedure) CPT copyright 2016 American Medical Association. All rights reserved. Jeffrey Rosencrans L. Loletha Carrow, MD 01/31/2016 1:51:31 PM This report has been signed electronically. Number of Addenda: 0 Referring MD:      Jeffrey Prince

## 2016-02-01 ENCOUNTER — Telehealth: Payer: Self-pay | Admitting: Emergency Medicine

## 2016-02-01 NOTE — Telephone Encounter (Signed)
  Follow up Call-  Call back number 01/31/2016  Post procedure Call Back phone  # [phone]  Permission to leave phone message Yes     Patient questions:  Do you have a fever, pain , or abdominal swelling? No. Pain Score  0 *  Have you tolerated food without any problems? Yes.    Have you been able to return to your normal activities? Yes.    Do you have any questions about your discharge instructions: Diet   No. Medications  No. Follow up visit  No.  Do you have questions or concerns about your Care? No.  Actions: * If pain score is 4 or above: No action needed, pain <4.

## 2016-04-03 ENCOUNTER — Other Ambulatory Visit: Payer: Self-pay | Admitting: Neurology

## 2016-04-03 DIAGNOSIS — R259 Unspecified abnormal involuntary movements: Secondary | ICD-10-CM

## 2016-04-03 DIAGNOSIS — G3184 Mild cognitive impairment, so stated: Secondary | ICD-10-CM

## 2016-04-19 ENCOUNTER — Ambulatory Visit
Admission: RE | Admit: 2016-04-19 | Discharge: 2016-04-19 | Disposition: A | Discharge status: Home / Self Care | Payer: BLUE CROSS/BLUE SHIELD | Source: Ambulatory Visit | Attending: Neurology | Admitting: Neurology

## 2016-04-19 DIAGNOSIS — G319 Degenerative disease of nervous system, unspecified: Secondary | ICD-10-CM | POA: Insufficient documentation

## 2016-04-19 DIAGNOSIS — Z820 Family history of epilepsy and other diseases of the nervous system: Secondary | ICD-10-CM | POA: Insufficient documentation

## 2016-04-19 DIAGNOSIS — R252 Cramp and spasm: Secondary | ICD-10-CM | POA: Diagnosis not present

## 2016-04-19 DIAGNOSIS — R51 Headache: Secondary | ICD-10-CM | POA: Insufficient documentation

## 2016-04-19 DIAGNOSIS — R259 Unspecified abnormal involuntary movements: Secondary | ICD-10-CM

## 2016-04-19 DIAGNOSIS — G3184 Mild cognitive impairment, so stated: Secondary | ICD-10-CM | POA: Diagnosis present

## 2016-04-19 DIAGNOSIS — R634 Abnormal weight loss: Secondary | ICD-10-CM | POA: Insufficient documentation

## 2016-04-19 MED ORDER — GADOBENATE DIMEGLUMINE 529 MG/ML IV SOLN
15.0000 mL | Freq: Once | INTRAVENOUS | Status: AC | PRN
  Administered 2016-04-19: 3 mL via INTRAVENOUS

## 2016-04-29 ENCOUNTER — Emergency Department
Admission: EM | Admit: 2016-04-29 | Discharge: 2016-04-29 | Disposition: A | Discharge status: Home / Self Care | Payer: BLUE CROSS/BLUE SHIELD | Attending: Emergency Medicine | Admitting: Emergency Medicine

## 2016-04-29 ENCOUNTER — Emergency Department: Payer: BLUE CROSS/BLUE SHIELD

## 2016-04-29 ENCOUNTER — Encounter: Payer: Self-pay | Admitting: Emergency Medicine

## 2016-04-29 DIAGNOSIS — R1032 Left lower quadrant pain: Secondary | ICD-10-CM | POA: Diagnosis present

## 2016-04-29 DIAGNOSIS — F17210 Nicotine dependence, cigarettes, uncomplicated: Secondary | ICD-10-CM | POA: Diagnosis not present

## 2016-04-29 DIAGNOSIS — E785 Hyperlipidemia, unspecified: Secondary | ICD-10-CM | POA: Diagnosis not present

## 2016-04-29 DIAGNOSIS — Z79899 Other long term (current) drug therapy: Secondary | ICD-10-CM | POA: Insufficient documentation

## 2016-04-29 DIAGNOSIS — K625 Hemorrhage of anus and rectum: Secondary | ICD-10-CM | POA: Diagnosis not present

## 2016-04-29 HISTORY — DX: Huntington's disease: G10

## 2016-04-29 LAB — CBC
HCT: 41.5 % (ref 40.0–52.0)
Hemoglobin: 14.6 g/dL (ref 13.0–18.0)
MCH: 29.5 pg (ref 26.0–34.0)
MCHC: 35.1 g/dL (ref 32.0–36.0)
MCV: 84 fL (ref 80.0–100.0)
PLATELETS: 215 10*3/uL (ref 150–440)
RBC: 4.94 MIL/uL (ref 4.40–5.90)
RDW: 14.3 % (ref 11.5–14.5)
WBC: 5 10*3/uL (ref 3.8–10.6)

## 2016-04-29 LAB — COMPREHENSIVE METABOLIC PANEL
ALBUMIN: 4.4 g/dL (ref 3.5–5.0)
ALK PHOS: 53 U/L (ref 38–126)
ALT: 30 U/L (ref 17–63)
AST: 25 U/L (ref 15–41)
Anion gap: 5 (ref 5–15)
BUN: 10 mg/dL (ref 6–20)
CALCIUM: 9.4 mg/dL (ref 8.9–10.3)
CHLORIDE: 105 mmol/L (ref 101–111)
CO2: 28 mmol/L (ref 22–32)
CREATININE: 0.75 mg/dL (ref 0.61–1.24)
GFR calc non Af Amer: >60 mL/min (ref >60)
GLUCOSE: 83 mg/dL (ref 65–99)
Potassium: 4.5 mmol/L (ref 3.5–5.1)
SODIUM: 138 mmol/L (ref 135–145)
Total Bilirubin: 0.5 mg/dL (ref 0.3–1.2)
Total Protein: 8 g/dL (ref 6.5–8.1)

## 2016-04-29 LAB — TYPE AND SCREEN
ABO/RH(D): O POS
Antibody Screen: NEGATIVE

## 2016-04-29 LAB — LIPASE, BLOOD: LIPASE: 20 U/L (ref 11–51)

## 2016-04-29 MED ORDER — IOPAMIDOL (ISOVUE-300) INJECTION 61%
100.0000 mL | Freq: Once | INTRAVENOUS | Status: AC | PRN
  Administered 2016-04-29: 100 mL via INTRAVENOUS
  Filled 2016-04-29: qty 100

## 2016-04-29 MED ORDER — DIATRIZOATE MEGLUMINE & SODIUM 66-10 % PO SOLN
15.0000 mL | Freq: Once | ORAL | Status: AC
  Administered 2016-04-29: 15 mL via ORAL

## 2016-04-29 NOTE — ED Notes (Signed)
C/O rectal bleeding.  Onset of symptoms this morning.  Also c/o lower abdominal cramping.  Patient has had abdominal cramping in the past and is prescribed hyoscyamine sulfate.

## 2016-04-29 NOTE — ED Provider Notes (Addendum)
[facility] Emergency Department Provider Note  ____________________________________________   I have reviewed the triage vital signs and the nursing notes.   HISTORY  Chief Complaint Abdominal Pain and Rectal Bleeding    HPI Jeffrey Prince is a 50 y.o. male with a history of chronic abdominal pain, hemorrhoids, and on 17 March of this year a colonoscopy which revealed some mild diverticular disease presents today complaining of bright red blood per rectum. Mostly on the toilet paper. Had loose stool as well. Denies fever or chills but does have ongoing and slightly worsening left lower quadrant discomfort.  No vomiting or hematemesis. Patient does have chronic abdominal pain which makes this a little bit difficult to understand in terms of his acuity. He states that this is similar to multiple prior episodes of abdominal pain that he has had and he has been given medications similar to Bentyl to manage it. (Hycosamine sulfate)  Past Medical History  Diagnosis Date  . Blind left eye     Since birth  . Huntington's disease [name])     Patient Active Problem List   Diagnosis Date Noted  . Unintentional weight loss 05/16/2015  . Diarrhea 05/16/2015  . GERD (gastroesophageal reflux disease) 05/16/2015  . Diffuse abdominal pain 05/16/2015  . Dyslipidemia (high LDL; low HDL) 01/25/2015  . Decreased visual acuity 01/24/2015  . Erectile dysfunction 01/24/2015  . Current every day smoker 01/24/2015  . 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 Colonoscopy      with polyp removal  . Rotator cuff repair    . Hemorrhoid surgery      Current Outpatient Rx  Name  Route  Sig  Dispense  Refill  . dicyclomine (BENTYL) 10 MG capsule   Oral   Take 1 capsule (10 mg total) by mouth 3 (three) times daily before meals. Patient not taking: Reported on 01/31/2016   90 capsule   1   . ranitidine (ZANTAC) 150  MG tablet   Oral   Take 1 tablet (150 mg total) by mouth 2 (two) times daily.   30 tablet   5   . sildenafil (VIAGRA) 25 MG tablet   Oral   Take 1 tablet (25 mg total) by mouth daily as needed for erectile dysfunction. Patient not taking: Reported on 01/12/2016   10 tablet   0     Allergies Bee venom and Codeine  Family History  Problem Relation Age of Onset  . Cancer Mother   . Cancer Paternal Uncle     Colon  . Diabetes Son   . Cancer Maternal Grandfather   . Cancer Paternal Grandmother   . Heart disease Paternal Grandmother   . Huntington's disease Paternal Grandmother     Social History Social History  Substance Use Topics  . Smoking status: Current Every Day Smoker -- 0.50 packs/day    Types: Cigarettes  . Smokeless tobacco: Never Used  . Alcohol Use: No    Review of Systems Constitutional: No fever/chills Eyes: No visual changes. ENT: No sore throat. No stiff neck no neck pain Cardiovascular: Denies chest pain. Respiratory: Denies shortness of breath. Gastrointestinal:   no vomiting.  No diarrhea.  No constipation. Genitourinary: Negative for dysuria. Musculoskeletal: Negative lower extremity swelling Skin: Negative for rash. Neurological: Negative for headaches, focal weakness or numbness. 10-point ROS otherwise negative.  ____________________________________________   PHYSICAL EXAM:  VITAL SIGNS: ED Triage Vitals  Enc Vitals Group  BP 04/29/16 1329 115/83 mmHg     Pulse Rate 04/29/16 1329 50     Resp 04/29/16 1329 18     Temp 04/29/16 1329 97.9 F (36.6 C)     Temp Source 04/29/16 1329 Oral     SpO2 04/29/16 1329 100 %     Weight 04/29/16 1329 139 lb (63.05 kg)     Height 04/29/16 1329 5\' 10"  (1.778 m)     Head Cir --      Peak Flow --      Pain Score 04/29/16 1329 6     Pain Loc --      Pain Edu? --      Excl. in [place]? --     Constitutional: Alert and oriented. Well appearing and in no acute distress. Eyes: Conjunctivae are  normal. . EOMI. Head: Atraumatic. Nose: No congestion/rhinnorhea. Mouth/Throat: Mucous membranes are moist.  Oropharynx non-erythematous. Neck: No stridor.   Nontender with no meningismus Cardiovascular: Normal rate, regular rhythm. Grossly normal heart sounds.  Good peripheral circulation. Respiratory: Normal respiratory effort.  No retractions. Lungs CTAB. Abdominal: Soft and Mild tenderness to palpation to the left lower quadrant. No distention. No guarding no rebound Rectal exam, no gross blood, he has guaiac positive secretions. There is no obvious hemorrhoid noted Back:  There is no focal tenderness or step off there is no midline tenderness there are no lesions noted. there is no CVA tenderness  Musculoskeletal: No lower extremity tenderness. No joint effusions, no DVT signs strong distal pulses no edema Neurologic:  Normal speech and language. No gross focal neurologic deficits are appreciated.  Skin:  Skin is warm, dry and intact. No rash noted. Psychiatric: Mood and affect are normal. Speech and behavior are normal.  ____________________________________________   LABS (all labs ordered are listed, but only abnormal results are displayed)  Labs Reviewed  COMPREHENSIVE METABOLIC PANEL  CBC  LIPASE, BLOOD  URINALYSIS COMPLETEWITH MICROSCOPIC ([place])  POC OCCULT BLOOD, ED  TYPE AND SCREEN   ____________________________________________  EKG  I personally interpreted any EKGs ordered by me or triage  ____________________________________________  RADIOLOGY  I reviewed any imaging ordered by me or triage that were performed during my shift and, if possible, patient and/or family made aware of any abnormal findings. ____________________________________________   PROCEDURES  Procedure(s) performed: None  Critical Care performed: None  ____________________________________________   INITIAL IMPRESSION / ASSESSMENT AND PLAN / ED COURSE  Pertinent labs & imaging  results that were available during my care of the patient were reviewed by me and considered in my medical decision making (see chart for details).  Patient with chronic abdominal pain extensively worked up with trace blood which she has had before. Hemoglobin is reassuring. Patient does have however some left lower quadrant discomfort, which is certainly not surgical but if he has diverticulitis, which change management therefore we will obtain imaging.  ----------------------------------------- 6:36 PM on 04/29/2016 -----------------------------------------  Repeat rectal exam shows no evidence of rectal prolapse, unclear CT findings to suggest that. Patient has no evidence of ongoing bleeding has not bled since she's been here, has been here for many hours. Vital signs etc. all reassuring. We'll discharge the patient home with extensive return precautions for any worsening symptoms. ____________________________________________   FINAL CLINICAL IMPRESSION(S) / ED DIAGNOSES  Final diagnoses:  None      This chart was dictated using voice recognition software.  Despite best efforts to proofread,  errors can occur which can change meaning.  Schuyler Amor, MD 04/29/16 [place], MD 04/29/16 [phone]

## 2016-04-29 NOTE — Discharge Instructions (Signed)

## 2016-06-21 NOTE — Progress Notes (Signed)
Jeffrey Prince was seen today in neurologic consultation at the request of Dr. Manuella Ghazi.  His PCP is Delorise Jackson, MD.  The consultation is for the evaluation of chorea.  I have had the opportunity to review his prior neurology records.  The patient presented to Dr. Manuella Ghazi in May, 2017 with complaints of weight loss and abnormal movements and reported a family history of chorea.  The patient reports that his father, paternal grandfother, and great grandmother all had a history of Huntington's disease.  Dr Manuella Ghazi obtained an MRI of the brain with and without gadolinium on April 19, 2016.  I reviewed this.  Unfortunately, they lost IV access after only a small amount of gadolinium had infused and the patient refused attempts at further IV access.  The MRI of the brain was unremarkable except mild atrophy.  There was good visualization of the caudate heads bilaterally.  He was told that he had shrinkage of the brain and states that is why he is here today.  He states that he was told that the shrinkage was causing the "speech issues" and "my muscle issues."  Cannot tell me what his speech problems or what his muscles problems are.  Denies any falls.  When asked about excess movements, he cannot state if he has them or not.   He had genetic testing for chorea through [facility] and there was no CAG repeat expansions.  Admits to weight loss of 15 lbs over 6 months.  Appetite is "so so" and states that he is eating normally.  He is a smoker.  He smokes 1/2 ppd x 20 years.  States that he was put on Aricept but it caused diarrhea and it was d/c.     ALLERGIES:   Allergies  Allergen Reactions  . Bee Venom Swelling  . Codeine Nausea Only    CURRENT MEDICATIONS:  Outpatient Encounter Prescriptions as of 06/24/2016  Medication Sig  . ranitidine (ZANTAC) 150 MG tablet Take 1 tablet (150 mg total) by mouth 2 (two) times daily.  . [DISCONTINUED] dicyclomine (BENTYL) 10 MG capsule Take 1 capsule (10  mg total) by mouth 3 (three) times daily before meals. (Patient not taking: Reported on 01/31/2016)  . [DISCONTINUED] sildenafil (VIAGRA) 25 MG tablet Take 1 tablet (25 mg total) by mouth daily as needed for erectile dysfunction. (Patient not taking: Reported on 01/12/2016)   No facility-administered encounter medications on file as of 06/24/2016.     PAST MEDICAL HISTORY:   Past Medical History:  Diagnosis Date  . Blind left eye    Since birth  . GERD (gastroesophageal reflux disease)   . Huntington's disease ([place])     PAST SURGICAL HISTORY:   Past Surgical History:  Procedure Laterality Date  . COLONOSCOPY     with polyp removal  . HEMORRHOID SURGERY    . ROTATOR CUFF REPAIR      SOCIAL HISTORY:   Social History   Social History  . Marital status: Single    Spouse name: N/A  . Number of children: N/A  . Years of education: N/A   Occupational History  . Not on file.   Social History Main Topics  . Smoking status: Current Every Day Smoker    Packs/day: 0.50    Types: Cigarettes  . Smokeless tobacco: Never Used  . Alcohol use No  . Drug use: No  . Sexual activity: Not on file   Other Topics Concern  . Not on file  Social History Narrative  . No narrative on file    FAMILY HISTORY:   Family Status  Relation Status  . Mother Alive  . Father Deceased  . Paternal Uncle   . Son   . Maternal Grandfather   . Paternal Grandmother   . Sister Alive   x2  . Brother Alive   x2    ROS:  A complete 10 system review of systems was obtained and was unremarkable apart from what is mentioned above.  PHYSICAL EXAMINATION:    VITALS:   Vitals:   06/24/16 1230  BP: 104/68  Pulse: (!) 48  Weight: 141 lb (64 kg)  Height: 5\' 10"  (1.778 m)    GEN:  Normal appears male in no acute distress.  Appears stated age. HEENT:  Normocephalic, atraumatic. The mucous membranes are moist. The superficial temporal arteries are without ropiness or tenderness. Cardiovascular:  Bradycardic with reg rhythm. Lungs: Clear to auscultation bilaterally. Neck/Heme: There are no carotid bruits noted bilaterally.  NEUROLOGICAL: Orientation:  The patient is alert and oriented x 3.  However, he is a poor historian and is very apraxic with commands.   Cranial nerves: There is good facial symmetry. The pupils are equal round and reactive to light bilaterally. Fundoscopic exam reveals clear disc margins bilaterally. When asked to do formal EOM testing and test horizontal gaze to the L, he kept telling me that his eyes don't move to the left because he is blind out of the left eye.  However, he was clearly spontaneously able to move the eyes horizontally to the L.  He blinks to visual menace from all directions but doesn't participate with VF testing stating that "I am blind out of the left eye."  Speech is initially stuttering but as the visit goes on it is more fluent.  It is clear. Soft palate rises symmetrically and there is no tongue deviation. Hearing is intact to conversational tone. Tone: Tone is good throughout. Sensation: Sensation is intact to light touch and pinprick throughout (facial, trunk, extremities). Vibration is intact at the bilateral big toe, but there is vibrational splitting on the forehead (R stronger than L) and vibrational splitting on chest wall (L stronger than right). There is no extinction with double simultaneous stimulation. There is no sensory dermatomal level identified. Coordination:  The patient has no difficulty with RAM's.  When asked to perform finger-nose-finger up his eyes closed, he consistently (tested 10 times on each side) hits the forehead and the same place on the forehead each time.  With the eyes open, he is able to hit the nose bilaterally. Motor: There is diffuse give way weakness that improves everywhere with encouragement to virtually 5 minus/5 everywhere.  It could be better than this, but give way weakness prevents accurate assessment of  strength.  Shoulder shrug is equal and symmetric. There is no pronator drift.  There are no fasciculations noted. DTR's: Deep tendon reflexes are 2/4 at the bilateral biceps, triceps, brachioradialis, patella and achilles.  Plantar responses are downgoing bilaterally. Gait and Station: The patient is able to ambulate without difficulty. The patient is able to heel toe walk without any difficulty. The patient is able to ambulate in a tandem fashion. The patient is able to stand in the Romberg position. Abnormal movements: Upon first walking in the room, the patient does move about his legs, but that quickly goes away during the history and never comes back again during physical examination (the remainder of the 50 minutes).  LABS    Chemistry      Component Value Date/Time   NA 138 04/29/2016 1331   NA 140 10/10/2014 0842   K 4.5 04/29/2016 1331   K 4.1 10/10/2014 0842   CL 105 04/29/2016 1331   CL 109 (H) 10/10/2014 0842   CO2 28 04/29/2016 1331   CO2 26 10/10/2014 0842   BUN 10 04/29/2016 1331   BUN 5 (L) 10/10/2014 0842   CREATININE 0.75 04/29/2016 1331   CREATININE 0.82 10/10/2014 0842      Component Value Date/Time   CALCIUM 9.4 04/29/2016 1331   CALCIUM 8.6 10/10/2014 0842   ALKPHOS 53 04/29/2016 1331   ALKPHOS 52 10/10/2014 0842   AST 25 04/29/2016 1331   AST 21 10/10/2014 0842   ALT 30 04/29/2016 1331   ALT 34 10/10/2014 0842   BILITOT 0.5 04/29/2016 1331   BILITOT 0.5 10/10/2014 0842     Lab Results  Component Value Date   WBC 5.0 04/29/2016   HGB 14.6 04/29/2016   HCT 41.5 04/29/2016   MCV 84.0 04/29/2016   PLT 215 04/29/2016   No results found for: TSH    IMPRESSION/PLAN  1. Chorea, by history  -As above, he had some movements when I first walked in the room, but they were gone the remainder of the time.  This was really not his complaint today.  He already had genetic testing for chorea that was negative.  I told him I would do other lab work just to make  sure I am not missing any other causes.   He will have the following labs:  ceruloplasmin, copper,  TSH, PTH, ferritin, sedimentation rate, ANA, antiphospholipid antibody, lupus anticoagulant, RPR, B12, antigliadin antibody.  If negative, we could consider a paraneoplastic panel that I would need to see definitive choreiform movement before I think that we should proceed with that.  -The patient's biggest concern today was "shrinkage" of the brain.  I told the patient that while he did have some atrophy I wasn't sure that this was clinically relevant.  Would have been helpful to have some family members here to discuss baseline cognitive status.  We ultimately decided to go ahead and do neuropsych testing given some complaints of memory change.  However, he did also have a fairly significant number of nonphysiologic findings on examination today.  -reports that he has f/u with Dr. Manuella Ghazi and if movements should increase, then we should proceed with further testing, as above.    -f/u will be based on the above.  Much greater than 50% of this visit was spent in counseling and coordinating care.  Decision making was of mod complexity.  Visit time, 60 min

## 2016-06-24 ENCOUNTER — Other Ambulatory Visit: Payer: BLUE CROSS/BLUE SHIELD

## 2016-06-24 ENCOUNTER — Ambulatory Visit (INDEPENDENT_AMBULATORY_CARE_PROVIDER_SITE_OTHER): Payer: BLUE CROSS/BLUE SHIELD | Admitting: Neurology

## 2016-06-24 ENCOUNTER — Encounter: Payer: Self-pay | Admitting: Neurology

## 2016-06-24 VITALS — BP 104/68 | HR 48 | Ht 70.0 in | Wt 141.0 lb

## 2016-06-24 DIAGNOSIS — R482 Apraxia: Secondary | ICD-10-CM

## 2016-06-24 DIAGNOSIS — G255 Other chorea: Secondary | ICD-10-CM | POA: Diagnosis not present

## 2016-06-24 NOTE — Patient Instructions (Signed)
1. Your provider has requested that you have labwork completed today. Please go to [facility] ([id number]) on the second floor of this building before leaving the office today. You do not need to check in. If you are not called within 15 minutes please check with the front desk.   

## 2016-06-24 NOTE — Progress Notes (Signed)
Note sent to Dr. Manuella Ghazi.

## 2016-06-25 LAB — RPR

## 2016-06-25 LAB — GLIADIN ANTIBODIES, SERUM
GLIADIN IGA: 3 U (ref <20)
GLIADIN IGG: 3 U (ref <20)

## 2016-06-25 LAB — VITAMIN B12: Vitamin B-12: 492 pg/mL (ref 200–1100)

## 2016-06-25 LAB — PTH, INTACT AND CALCIUM
CALCIUM: 9.8 mg/dL (ref 8.4–10.5)
PTH: 20 pg/mL (ref 14–64)

## 2016-06-25 LAB — TSH: TSH: 1.56 m[IU]/L (ref 0.40–4.50)

## 2016-06-25 LAB — ANA: Anti Nuclear Antibody(ANA): NEGATIVE

## 2016-06-25 LAB — FERRITIN: Ferritin: 102 ng/mL (ref 20–380)

## 2016-06-25 LAB — SEDIMENTATION RATE: SED RATE: 4 mm/h (ref 0–15)

## 2016-06-26 LAB — CERULOPLASMIN: Ceruloplasmin: 25 mg/dL (ref 18–36)

## 2016-06-28 LAB — RFX DRVVT SCR W/RFLX CONF 1:1 MIX: DRVVT SCREEN: 38 s (ref <=45)

## 2016-06-28 LAB — LUPUS ANTICOAGULANT PANEL

## 2016-06-28 LAB — RFX PTT-LA W/RFX TO HEX PHASE CONF: PTT-LA SCREEN: 37 s (ref <=40)

## 2016-06-29 LAB — ANTIPHOSPHOLOPID AB PANEL
Anticardiolipin IgA: <11 [APL'U]
Anticardiolipin IgG: <14 [GPL'U]
Phosphatidylserine IgG Autoantibodies: <10 U/mL (ref <10)
Phosphatydalserine, IgA: <20 U/mL (ref <20)

## 2016-07-01 ENCOUNTER — Encounter: Payer: Self-pay | Admitting: Psychology

## 2016-07-01 ENCOUNTER — Ambulatory Visit (INDEPENDENT_AMBULATORY_CARE_PROVIDER_SITE_OTHER): Payer: BLUE CROSS/BLUE SHIELD | Admitting: Psychology

## 2016-07-01 DIAGNOSIS — R413 Other amnesia: Secondary | ICD-10-CM

## 2016-07-01 DIAGNOSIS — F329 Major depressive disorder, single episode, unspecified: Secondary | ICD-10-CM

## 2016-07-01 DIAGNOSIS — F32A Depression, unspecified: Secondary | ICD-10-CM
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
## 2016-07-04 ENCOUNTER — Ambulatory Visit (INDEPENDENT_AMBULATORY_CARE_PROVIDER_SITE_OTHER): Payer: BLUE CROSS/BLUE SHIELD | Admitting: Psychology

## 2016-07-04 DIAGNOSIS — R413 Other amnesia: Secondary | ICD-10-CM | POA: Diagnosis not present

## 2016-07-04 NOTE — Progress Notes (Signed)
   Neuropsychology Note  Jeffrey Prince returned today for 3 hours of neuropsychological testing with technician, Milana Kidney, BS, under the supervision of Dr. Macarthur Critchley. The patient did not appear overtly distressed by the testing session, per behavioral observation or via self-report to the technician. Rest breaks were offered. Jeffrey Prince will return within 2 weeks for a feedback session with Dr. Si Raider at which time his test performances, clinical impressions and treatment recommendations will be reviewed in detail. The patient understands he can contact our office should he require our assistance before this time.  Full report to follow.

## 2016-07-05 ENCOUNTER — Telehealth: Payer: Self-pay | Admitting: Neurology

## 2016-07-05 LAB — COPPER, FREE: Copper - Free, Serum/Plasma: 490 mcg/L

## 2016-07-05 NOTE — Telephone Encounter (Signed)
-----   Message from Briny Breezes, DO sent at 07/05/2016 11:28 AM EDT ----- Let pt know that labs were normal

## 2016-07-05 NOTE — Telephone Encounter (Signed)
Patient made aware.

## 2016-07-16 ENCOUNTER — Telehealth: Payer: Self-pay | Admitting: Neurology

## 2016-07-16 NOTE — Telephone Encounter (Signed)
I am happy to see him when he comes in to see Dr. Carles Collet, as long as we can fit him in my schedule that day. I can have the report done ahead of time so you have it beforehand, Dr. Carles Collet. It should not affect insurance billing for [place] to both see him the same day.

## 2016-07-16 NOTE — Telephone Encounter (Signed)
Dr. Carles Collet and Dr. Si Raider please advise.

## 2016-07-16 NOTE — Telephone Encounter (Signed)
Lets talk about this in person.  He may not even need f/u with me, unless you say that he does, depending on results.  He has a neurologist in [place] that he sees as well.

## 2016-07-16 NOTE — Telephone Encounter (Signed)
Jeffrey Prince 09/16/1966. He called in this morning and cancelled his next appointment with Dr. Si Raider for the 30 min follow up after the testing with Hinton Dyer. He has a conflict with rescheduling due to his work schedule. He was wondering could he get those results when he comes in to see Dr. Carles Collet at that follow up appointment. His # is [id number]. Thank you

## 2016-07-16 NOTE — Telephone Encounter (Signed)
Not unless Dr. Si Raider has time for that and I would need written report for that (Dr. Si Raider does that affect his insurance - I.e seeing [place] both same day?  Probably not because bundled?

## 2016-07-17 NOTE — Telephone Encounter (Signed)
Theadora Rama- He can just be scheduled for a follow up appt with Dr. Carles Collet and an appt with Dr. Si Raider on the same day. He doesn't have an appt scheduled at all. Thanks.

## 2016-07-18 ENCOUNTER — Encounter: Payer: BLUE CROSS/BLUE SHIELD | Admitting: Psychology
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
## 2016-07-26 NOTE — Progress Notes (Signed)
Jeffrey Prince was seen today in neurologic consultation at the request of Dr. Manuella Ghazi.  His PCP is Delorise Jackson, MD.  The consultation is for the evaluation of chorea.  I have had the opportunity to review his prior neurology records.  The patient presented to Dr. Manuella Ghazi in May, 2017 with complaints of weight loss and abnormal movements and reported a family history of chorea.  The patient reports that his father, paternal grandfother, and great grandmother all had a history of Huntington's disease.  Dr Manuella Ghazi obtained an MRI of the brain with and without gadolinium on April 19, 2016.  I reviewed this.  Unfortunately, they lost IV access after only a small amount of gadolinium had infused and the patient refused attempts at further IV access.  The MRI of the brain was unremarkable except mild atrophy.  There was good visualization of the caudate heads bilaterally.  He was told that he had shrinkage of the brain and states that is why he is here today.  He states that he was told that the shrinkage was causing the "speech issues" and "my muscle issues."  Cannot tell me what his speech problems or what his muscles problems are.  Denies any falls.  When asked about excess movements, he cannot state if he has them or not.   He had genetic testing for chorea through [facility] and there was no CAG repeat expansions.  Admits to weight loss of 15 lbs over 6 months.  Appetite is "so so" and states that he is eating normally.  He is a smoker.  He smokes 1/2 ppd x 20 years.  States that he was put on Aricept but it caused diarrhea and it was d/c.    07/26/16 update:  The patient follows up today.  He did see Dr. Si Raider for neuropsych testing.  Unfortunately, test results were not valid.    He had multiple lab tests that were done and reviewed.  Those are listed below and were unremarkable.  He brings his MRI brain (which I already had) as he is still focused on being told his brain was shrunk.  He reports  that he had normal HIV testing this year.  Lost 40lb without trying over 4 months per pt but weight loss stopped and steady since started seeing Dr. Manuella Ghazi in May.  Thinks legs feel weak but "I just need to walk more."  No falls.  Still having trouble remembering "songs at church."   ALLERGIES:   Allergies  Allergen Reactions  . Bee Venom Swelling  . Codeine Nausea Only    CURRENT MEDICATIONS:  Outpatient Encounter Prescriptions as of 07/29/2016  Medication Sig  . ranitidine (ZANTAC) 150 MG tablet Take 1 tablet (150 mg total) by mouth 2 (two) times daily.   No facility-administered encounter medications on file as of 07/29/2016.     PAST MEDICAL HISTORY:   Past Medical History:  Diagnosis Date  . Blind left eye    Since birth  . GERD (gastroesophageal reflux disease)     PAST SURGICAL HISTORY:   Past Surgical History:  Procedure Laterality Date  . COLONOSCOPY     with polyp removal  . HEMORRHOID SURGERY    . ROTATOR CUFF REPAIR      SOCIAL HISTORY:   Social History   Social History  . Marital status: Single    Spouse name: N/A  . Number of children: N/A  . Years of education: N/A   Occupational History  .  Not on file.   Social History Main Topics  . Smoking status: Current Every Day Smoker    Packs/day: 0.50    Types: Cigarettes  . Smokeless tobacco: Never Used  . Alcohol use No  . Drug use: No  . Sexual activity: Not on file   Other Topics Concern  . Not on file   Social History Narrative  . No narrative on file    FAMILY HISTORY:   Family Status  Relation Status  . Mother Alive  . Father Deceased  . Paternal Uncle   . Son   . Maternal Grandfather   . Paternal Grandmother   . Sister Alive   x2  . Brother Alive   x2    ROS:  A complete 10 system review of systems was obtained and was unremarkable apart from what is mentioned above.  PHYSICAL EXAMINATION:    VITALS:   Vitals:   07/29/16 1444  BP: (!) 148/90  Pulse: (!) 50  Weight: 142  lb (64.4 kg)  Height: 5\' 10"  (1.778 m)    GEN:  Normal appears male in no acute distress.  Appears stated age. HEENT:  Normocephalic, atraumatic. The mucous membranes are moist. The superficial temporal arteries are without ropiness or tenderness. Cardiovascular: Bradycardic with reg rhythm. Lungs: Clear to auscultation bilaterally. Neck/Heme: There are no carotid bruits noted bilaterally.  NEUROLOGICAL: Orientation:  The patient is alert and oriented x 3.   Cranial nerves: There is good facial symmetry. There is L esotropia but after several attempts, is able to understand that I want him to follow my finger and is able to do that.  Speech is initially stuttering but as the visit goes on it is more fluent.  It is clear. Soft palate rises symmetrically and there is no tongue deviation. Hearing is intact to conversational tone. Tone: Tone is good throughout. Sensation: Sensation is intact to light touch Coordination:  The patient has no difficulty with RAM's.   Motor: strength at least antigravity x 4. DTR's: Deep tendon reflexes are 2/4 at the bilateral biceps, triceps, brachioradialis, patella and achilles.  Plantar responses are downgoing bilaterally. Gait and Station: The patient is able to ambulate without difficulty.  Abnormal movements: none.  Watched patient in waiting room fill out paperwork and no abnormal movements then either.  LABS Lab on 06/24/2016  Component Date Value Ref Range Status  . Anit Nuclear Antibody(ANA) 06/25/2016 NEG  NEGATIVE Final  . Phosphatidylserine IgG Autoantibod* 06/29/2016 <10  <10 U/mL Final  . Phosphatydalserine, IgM 06/29/2016 <25  <25 U/mL Final  . Phosphatydalserine, IgA 06/29/2016 <20  <20 U/mL Final  . Beta-2 Glyco I IgG 06/29/2016 <9  <=20 SGU Final  . Beta-2-Glycoprotein I IgM 06/29/2016 <9  <=20 SMU Final  . Beta-2-Glycoprotein I IgA 06/29/2016 <9  <=20 SAU Final  . Anticardiolipin IgA 06/29/2016 <11  APL Final  . Anticardiolipin IgG  06/29/2016 <14  GPL Final  . Anticardiolipin IgM 06/29/2016 <12  MPL Final  . Ceruloplasmin 06/26/2016 25  18 - 36 mg/dL Final  . Ferritin 06/25/2016 102  20 - 380 ng/mL Final  . Gliadin IgG 06/25/2016 3  <20 Units Final  . Gliadin IgA 06/25/2016 3  <20 Units Final  . PTH 06/25/2016 20  14 - 64 pg/mL Final  . Calcium 06/25/2016 9.8  8.4 - 10.5 mg/dL Final  . Sed Rate 06/25/2016 4  0 - 15 mm/hr Final  . TSH 06/25/2016 1.56  0.40 - 4.50 mIU/L Final  .  Vitamin B-12 06/25/2016 492  200 - 1,100 pg/mL Final  . RPR Ser Ql 06/25/2016 NON REAC  NON REAC Final  . Lupus Anticoagulant Eval 06/28/2016 REPORT   Final  . Copper - Free, Serum/Plasma 07/05/2016 490  mcg/L Final  . dRVVT Mix Interp. 06/28/2016 REPORT   Final  . dRVVT Screen 06/28/2016 38  <=45 sec Final  . dRVVT 06/28/2016 REPORT   Final  . PTT-LA Screen 06/28/2016 37  <=40 sec Final  . Additional Testing 06/28/2016 REPORT   Final  Admission on 04/29/2016, Discharged on 04/29/2016  Component Date Value Ref Range Status  . Sodium 04/29/2016 138  135 - 145 mmol/L Final  . Potassium 04/29/2016 4.5  3.5 - 5.1 mmol/L Final  . Chloride 04/29/2016 105  101 - 111 mmol/L Final  . CO2 04/29/2016 28  22 - 32 mmol/L Final  . Glucose, Bld 04/29/2016 83  65 - 99 mg/dL Final  . BUN 04/29/2016 10  6 - 20 mg/dL Final  . Creatinine, Ser 04/29/2016 0.75  0.61 - 1.24 mg/dL Final  . Calcium 04/29/2016 9.4  8.9 - 10.3 mg/dL Final  . Total Protein 04/29/2016 8.0  6.5 - 8.1 g/dL Final  . Albumin 04/29/2016 4.4  3.5 - 5.0 g/dL Final  . AST 04/29/2016 25  15 - 41 U/L Final  . ALT 04/29/2016 30  17 - 63 U/L Final  . Alkaline Phosphatase 04/29/2016 53  38 - 126 U/L Final  . Total Bilirubin 04/29/2016 0.5  0.3 - 1.2 mg/dL Final  . GFR calc non Af Amer 04/29/2016 >60  >60 mL/min Final  . GFR calc Af Amer 04/29/2016 >60  >60 mL/min Final  . Anion gap 04/29/2016 5  5 - 15 Final  . WBC 04/29/2016 5.0  3.8 - 10.6 K/uL Final  . RBC 04/29/2016 4.94  4.40 - 5.90  MIL/uL Final  . Hemoglobin 04/29/2016 14.6  13.0 - 18.0 g/dL Final  . HCT 04/29/2016 41.5  40.0 - 52.0 % Final  . MCV 04/29/2016 84.0  80.0 - 100.0 fL Final  . MCH 04/29/2016 29.5  26.0 - 34.0 pg Final  . MCHC 04/29/2016 35.1  32.0 - 36.0 g/dL Final  . RDW 04/29/2016 14.3  11.5 - 14.5 % Final  . Platelets 04/29/2016 215  150 - 440 K/uL Final  . ABO/RH(D) 04/29/2016 O POS   Final  . Antibody Screen 04/29/2016 NEG   Final  . Sample Expiration 04/29/2016 05/02/2016   Final  . Lipase 04/29/2016 20  11 - 51 U/L Final    IMPRESSION/PLAN  1. Chorea, by history  -Have not seen any chorea in the office, either with me or the greater than 1 hr he was here for neuropsych testing.   He already had genetic testing for chorea that was negative.  Detailed lab work up was normal.  Would not pursue this further unless starts to have abnormal movements that are persistent (I have never seen chorea in him).    -The patient's biggest concern remains  "shrinkage" of the brain.  I reviewed MRI again and told him that while he does have some atrophy, I am not sure why that is.  Reports no EtOH or illicit drugs.  Told him I didn't know if it was clinically relevant but recommend repeat in a year to assess stability.  2.  Memory change  -neuropsych testing pursued but results not valid.  Reasons for her invalid testing could be carelessness, reading difficulties, exaggeration, malingering, defensiveness,  confusion.  Pt reports having HIV testing done that was negative.  Multiple labs here negative.  Will continue to f/u with Dr. Manuella Ghazi regarding memory change.  Told him having family member with him so that Dr. Manuella Ghazi can talk to them about baseline cognition could be helpful.   3.  F/u prn

## 2016-07-29 ENCOUNTER — Encounter: Payer: Self-pay | Admitting: Neurology

## 2016-07-29 ENCOUNTER — Encounter: Payer: Self-pay | Admitting: Psychology

## 2016-07-29 ENCOUNTER — Ambulatory Visit (INDEPENDENT_AMBULATORY_CARE_PROVIDER_SITE_OTHER): Payer: BLUE CROSS/BLUE SHIELD | Admitting: Neurology

## 2016-07-29 ENCOUNTER — Ambulatory Visit (INDEPENDENT_AMBULATORY_CARE_PROVIDER_SITE_OTHER): Payer: BLUE CROSS/BLUE SHIELD | Admitting: Psychology

## 2016-07-29 VITALS — BP 148/90 | HR 50 | Ht 70.0 in | Wt 142.0 lb

## 2016-07-29 DIAGNOSIS — R413 Other amnesia: Secondary | ICD-10-CM

## 2016-08-27 ENCOUNTER — Telehealth: Payer: Self-pay | Admitting: Surgery

## 2016-08-27 NOTE — Telephone Encounter (Signed)
Left voice message for patient to call and schedule appointment with a surgeon for HO rectal prolapse,hemmorhoids,ab pain with bowel movements. Referred by [place]

## 2016-09-09 ENCOUNTER — Encounter: Payer: Self-pay | Admitting: Surgery

## 2016-09-12 ENCOUNTER — Encounter: Payer: Self-pay | Admitting: Surgery

## 2016-09-12 ENCOUNTER — Ambulatory Visit (INDEPENDENT_AMBULATORY_CARE_PROVIDER_SITE_OTHER): Payer: BLUE CROSS/BLUE SHIELD | Admitting: Surgery

## 2016-09-12 VITALS — BP 125/74 | HR 49 | Temp 98.3°F | Ht 70.0 in | Wt 136.4 lb

## 2016-09-12 DIAGNOSIS — K602 Anal fissure, unspecified: Secondary | ICD-10-CM | POA: Diagnosis not present

## 2016-09-12 NOTE — Patient Instructions (Signed)
We have called your medicine to [facility]. Please see your follow up appointment listed below. Please call our office if you have any questions or concerns.

## 2016-09-13 NOTE — Progress Notes (Signed)
Patient ID: Jeffrey Prince, male   DOB: 03/30/1966, 50 y.o.   MRN: [id number]  HPI Jeffrey Prince is a 50 y.o. male seen for  anorectal pain and hematochezia. He reports that his pain is sharp moderate in intensity and intermittently. Pain is aggravated by bowel movements. He had a history of hematochezia that has subsided over the last few weeks. His last colonoscopy was less than 8 months ago showing only diverticulosis without evidence of any major anorectal pathology. He states that he had hemorrhoidal surgery previously 2 and did very well with it. 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 He underwent a CT scan of the abdomen and pelvis that I have personally reviewed without any evidence of acute abdominal the malleus. There was a question about rectal prolapse.  HPI  Past Medical History:  Diagnosis Date  . Acne   . Blind left eye    Since birth  . Chorea   . Diverticulosis   . ED (erectile dysfunction)   . GERD (gastroesophageal reflux disease)   . Hemorrhoids   . Weight loss    Abnormal    Past Surgical History:  Procedure Laterality Date  . COLONOSCOPY     with polyp removal  . HEMORRHOID SURGERY    . ROTATOR CUFF REPAIR  1991    Family History  Problem Relation Age of Onset  . Breast cancer Mother   . Huntington's disease Father   . Cancer Paternal Uncle     Colon  . Diabetes Son   . Cancer Maternal Grandfather   . Cancer Paternal Grandmother   . Heart disease Paternal Grandmother   . Huntington's disease Paternal Grandmother     Social History Social History  Substance Use Topics  . Smoking status: Current Every Day Smoker    Packs/day: 0.50    Years: 20.00    Types: Cigarettes  . Smokeless tobacco: Never Used  . Alcohol use No    Allergies  Allergen Reactions  . Bee Venom Swelling  . Codeine Diarrhea and Nausea Only  . Sulfa Antibiotics Nausea And Vomiting    Current  Outpatient Prescriptions  Medication Sig Dispense Refill  . hyoscyamine (ANASPAZ) 0.125 MG TBDP disintergrating tablet Place 0.125 mg under the tongue.    . lidocaine-hydrocortisone (ANAMANTEL HC) 3-0.5 % CREA Place 1 Applicatorful rectally 2 (two) times daily.    Marland Kitchen loratadine (CLARITIN) 10 MG tablet Take 10 mg by mouth daily.    . ranitidine (ZANTAC) 150 MG tablet Take 1 tablet (150 mg total) by mouth 2 (two) times daily. 30 tablet 5  . sildenafil (VIAGRA) 25 MG tablet Take 25 mg by mouth daily as needed for erectile dysfunction.     No current facility-administered medications for this visit.      Review of Systems A 10 point review of systems was asked and was negative except for the information on the HPI  Physical Exam Blood pressure 125/74, pulse (!) 49, temperature 98.3 F (36.8 C), temperature source Oral, height 5\' 10"  (1.778 m), weight 61.9 kg (136 lb 6.4 oz). CONSTITUTIONAL: . EYES: Pupils are equal, round, and reactive to light, Sclera are non-icteric. EARS, NOSE, MOUTH AND THROAT: The oropharynx is clear. The oral mucosa is pink and moist. Hearing is intact to voice. GI: The abdomen is  soft, nontender, and nondistended. There are no palpable masses. There is no hepatosplenomegaly. There are normal bowel sounds in all quadrants. GU: Recent  small posterior midline fissure. Exam is very uncomfortable. No evidence of prolapse and no evidence of  major hemorrhoidal disease . NO evidence of rectal prolapse MUSCULOSKELETAL: Normal muscle strength and tone. No cyanosis or edema.   SKIN: Turgor is good and there are no pathologic skin lesions or ulcers. NEUROLOGIC: Motor and sensation is grossly normal. Cranial nerves are grossly intact. PSYCH:  Oriented to person, place and time. Affect is normal.  Data Reviewed I have personally reviewed the patient's imaging, laboratory findings and medical records.    Assessment/Plan 50 year old male with history consistent with anal fissure.  We will start him on nifedipine c ream twice a day, sitz baths, high fiber diet and stool softener. Discussed with him about it medical management fails we may have to do a chemical sphincterotomy. At this time there is no need for any surgical intervention. He understands and we will see him back in about 3 weeks.  Caroleen Hamman, MD FACS General Surgeon 09/13/2016, 10:25 AM

## 2016-09-24 ENCOUNTER — Ambulatory Visit: Payer: BLUE CROSS/BLUE SHIELD | Admitting: Surgery

## 2016-09-24 ENCOUNTER — Encounter: Payer: Self-pay | Admitting: Surgery

## 2016-09-24 ENCOUNTER — Ambulatory Visit (INDEPENDENT_AMBULATORY_CARE_PROVIDER_SITE_OTHER): Payer: BLUE CROSS/BLUE SHIELD | Admitting: Surgery

## 2016-09-24 VITALS — BP 132/75 | HR 57 | Temp 99.2°F | Ht 70.0 in | Wt 142.2 lb

## 2016-09-24 DIAGNOSIS — K602 Anal fissure, unspecified: Secondary | ICD-10-CM

## 2016-09-24 MED ORDER — POLYETHYLENE GLYCOL 3350 17 G PO PACK: 17.0000 g | PACK | Freq: Two times a day (BID) | ORAL | 0 refills | Status: DC

## 2016-09-24 MED ORDER — HYDROCODONE-ACETAMINOPHEN 5-325 MG PO TABS: 1.0000 | ORAL_TABLET | Freq: Four times a day (QID) | ORAL | 0 refills | Status: DC | PRN

## 2016-09-24 MED ORDER — DOCUSATE SODIUM 100 MG PO CAPS: 100.0000 mg | ORAL_CAPSULE | Freq: Two times a day (BID) | ORAL | 0 refills | Status: DC

## 2016-09-24 NOTE — Patient Instructions (Addendum)
Please continue the Sitz baths, the Nifedipine cream given at your last appointment (Have your pharmacy call if you need a refill on this).  Begin Norco, Colace, and Miralax.  Start the High Fiber diet and use a fiber supplement to help keep stools soft and moving. You will need to drink at least 72 oz. Of water daily with the fiber supplement.  We will call you with the details of your surgery. This will be done at [facility] by Dr. Dahlia Byes during the week of 12/4-12/8.     High-Fiber Diet Fiber, also called dietary fiber, is a type of carbohydrate found in fruits, vegetables, whole grains, and beans. A high-fiber diet can have many health benefits. Your health care provider may recommend a high-fiber diet to help:  Prevent constipation. Fiber can make your bowel movements more regular.  Lower your cholesterol.  Relieve hemorrhoids, uncomplicated diverticulosis, or irritable bowel syndrome.  Prevent overeating as part of a weight-loss plan.  Prevent heart disease, type 2 diabetes, and certain cancers. What is my plan? The recommended daily intake of fiber includes:  38 grams for men under age 32.  41 grams for men over age 10.  104 grams for women under age 10.  43 grams for women over age 71. You can get the recommended daily intake of dietary fiber by eating a variety of fruits, vegetables, grains, and beans. Your health care provider may also recommend a fiber supplement if it is not possible to get enough fiber through your diet. What do I need to know about a high-fiber diet?  Fiber supplements have not been widely studied for their effectiveness, so it is better to get fiber through food sources.  Always check the fiber content on thenutrition facts label of any prepackaged food. Look for foods that contain at least 5 grams of fiber per serving.  Ask your dietitian if you have questions about specific foods that are related to your condition, especially if those foods are not  listed in the following section.  Increase your daily fiber consumption gradually. Increasing your intake of dietary fiber too quickly may cause bloating, cramping, or gas.  Drink plenty of water. Water helps you to digest fiber. What foods can I eat? Grains  Whole-grain breads. Multigrain cereal. Oats and oatmeal. Brown rice. Barley. Bulgur wheat. [place]. Bran muffins. Popcorn. Rye wafer crackers. Vegetables  Sweet potatoes. Spinach. Kale. Artichokes. Cabbage. Broccoli. Green peas. Carrots. Squash. Fruits  Berries. Pears. Apples. Oranges. Avocados. Prunes and raisins. Dried figs. Meats and Other Protein Sources  [place], kidney, pinto, and soy beans. Split peas. Lentils. Nuts and seeds. Dairy  Fiber-fortified yogurt. Beverages  Fiber-fortified soy milk. Fiber-fortified orange juice. Other  Fiber bars. The items listed above may not be a complete list of recommended foods or beverages. Contact your dietitian for more options.  What foods are not recommended? Grains  White bread. Pasta made with refined flour. White rice. Vegetables  Fried potatoes. Canned vegetables. Well-cooked vegetables. Fruits  Fruit juice. Cooked, strained fruit. Meats and Other Protein Sources  Fatty cuts of meat. Fried Sales executive or fried fish. Dairy  Milk. Yogurt. Cream cheese. Sour cream. Beverages  Soft drinks. Other  Cakes and pastries. Butter and oils. The items listed above may not be a complete list of foods and beverages to avoid. Contact your dietitian for more information.  What are some tips for including high-fiber foods in my diet?  Eat a wide variety of high-fiber foods.  Make sure that half of  all grains consumed each day are whole grains.  Replace breads and cereals made from refined flour or white flour with whole-grain breads and cereals.  Replace white rice with brown rice, bulgur wheat, or millet.  Start the day with a breakfast that is high in fiber, such as a cereal that contains  at least 5 grams of fiber per serving.  Use beans in place of meat in soups, salads, or pasta.  Eat high-fiber snacks, such as berries, raw vegetables, nuts, or popcorn. This information is not intended to replace advice given to you by your health care provider. Make sure you discuss any questions you have with your health care provider. Document Released: 10/21/2005 Document Revised: 03/28/2016 Document Reviewed: 04/05/2014 Elsevier Interactive Patient Education  2017 [employer].

## 2016-09-26 NOTE — Progress Notes (Signed)
Outpatient Surgical Follow Up  09/26/2016  Jeffrey Prince is an 50 y.o. male.   Chief Complaint  Patient presents with  . Follow-up    Hemorrhoids    HPI: f/u anal fissure, has tried nifedipine cream w/o major improvement, sitz baths. He has not started fiber nor stool softner, d/w him the importance of bowel habit regulation. Still experiences severe sharp intermittent pain anorectal area w some hematochezia. No fevers, or chills.   Past Medical History:  Diagnosis Date  . Acne   . Blind left eye    Since birth  . Chorea   . Diverticulosis   . ED (erectile dysfunction)   . GERD (gastroesophageal reflux disease)   . Hemorrhoids   . Weight loss    Abnormal    Past Surgical History:  Procedure Laterality Date  . COLONOSCOPY     with polyp removal  . HEMORRHOID SURGERY    . ROTATOR CUFF REPAIR  1991    Family History  Problem Relation Age of Onset  . Breast cancer Mother   . Huntington's disease Father   . Cancer Paternal Uncle     Colon  . Diabetes Son   . Cancer Maternal Grandfather   . Cancer Paternal Grandmother   . Heart disease Paternal Grandmother   . Huntington's disease Paternal Grandmother     Social History:  reports that he has been smoking Cigarettes.  He has a 10.00 pack-year smoking history. He has never used smokeless tobacco. He reports that he does not drink alcohol or use drugs.  Allergies:  Allergies  Allergen Reactions  . Bee Venom Swelling  . Codeine Diarrhea and Nausea Only  . Sulfa Antibiotics Nausea And Vomiting    Medications reviewed.    ROS Full ROS performed and is otherwise negative other than what is noted in HPI   BP 132/75   Pulse (!) 57   Temp 99.2 F (37.3 C) (Oral)   Ht 5\' 10"  (1.778 m)   Wt 64.5 kg (142 lb 3.2 oz)   BMI 20.40 kg/m   Physical Exam  Constitutional: He is oriented to person, place, and time and well-developed, well-nourished, and in no distress. No distress.  Neck: Neck supple. No JVD  present.  Cardiovascular: Normal rate and regular rhythm.   Pulmonary/Chest: Effort normal. No stridor. No respiratory distress.  Abdominal: Soft. He exhibits no distension. There is no tenderness.  Genitourinary:  Genitourinary Comments: There is a posterior midline fissure , very uncomfortable w tight sphincter, no masses  Musculoskeletal: Normal range of motion. He exhibits no edema.  Neurological: He is alert and oriented to person, place, and time. Gait normal. GCS score is 15.  Skin: Skin is warm and dry. He is not diaphoretic.  Psychiatric: Mood, memory, affect and judgment normal.  Nursing note and vitals reviewed.      No results found for this or any previous visit (from the past 48 hour(s)). No results found.  Assessment/Plan: Anal fissure not responsive to medical rx. D/W him again about stool regulation w fiber, h20, stool softner and miralax. D/W him about options and I do think he will benefit from chemical sphincterotomy w BOtox. D/W the pt in detail about the procedure, risks, benefits and possible complications including but not limited to: bleeding, infection, incontinence, persistent pain, re interventions. D/W him that we may be able to repeat it 2-3 times before we commit to standards surgical sphincterotomy. He understands.     Jeffrey Horn, MD FACS General  Surgeon  09/26/2016,10:37 AM

## 2016-09-30 ENCOUNTER — Telehealth: Payer: Self-pay | Admitting: Surgery

## 2016-09-30 NOTE — Telephone Encounter (Signed)
Patient called office back. Made aware of surgery information.

## 2016-09-30 NOTE — Telephone Encounter (Signed)
Pt advised of pre op date/time and sx date. Sx: 10/09/16 with Dr Ginny Forth Exam under anesthesia--Chemical Sphincterotomy.  Pre op: 10/03/16 between 1-5:00pm--Phone.   Patient made aware to call [phone], between 1-3:00pm the day before surgery, to find out what time to arrive.

## 2016-10-03 ENCOUNTER — Encounter
Admission: RE | Admit: 2016-10-03 | Discharge: 2016-10-03 | Disposition: A | Discharge status: Home / Self Care | Payer: BLUE CROSS/BLUE SHIELD | Source: Ambulatory Visit | Attending: Surgery | Admitting: Surgery

## 2016-10-03 NOTE — Patient Instructions (Signed)
  Your procedure is scheduled on: 10-09-16 Report to Same Day Surgery 2nd floor medical mall [facility] Entrance-take elevator on left to 2nd floor.  Check in with surgery information desk.) To find out your arrival time please call [phone] between 1PM - 3PM on 10-08-16  Remember: Instructions that are not followed completely may result in serious medical risk, up to and including death, or upon the discretion of your surgeon and anesthesiologist your surgery may need to be rescheduled.    _x___ 1. Do not eat food or drink liquids after midnight. No gum chewing or hard candies.     __x__ 2. No Alcohol for 24 hours before or after surgery.   __x__3. No Smoking for 24 prior to surgery.   ____  4. Bring all medications with you on the day of surgery if instructed.    __x__ 5. Notify your doctor if there is any change in your medical condition     (cold, fever, infections).     Do not wear jewelry, make-up, hairpins, clips or nail polish.  Do not wear lotions, powders, or perfumes. You may wear deodorant.  Do not shave 48 hours prior to surgery. Men may shave face and neck.  Do not bring valuables to the hospital.    [facility] is not responsible for any belongings or valuables.               Contacts, dentures or bridgework may not be worn into surgery.  Leave your suitcase in the car. After surgery it may be brought to your room.  For patients admitted to the hospital, discharge time is determined by your treatment team.   Patients discharged the day of surgery will not be allowed to drive home.  You will need someone to drive you home and stay with you the night of your procedure.    Please read over the following fact sheets that you were given:   [facility] Preparing for Surgery and or MRSA Information   _x___ Take these medicines the morning of surgery with A SIP OF WATER:    1. ZANTAC  2.  3.  4.  5.  6.  ____Fleets enema or Magnesium Citrate as  directed.   ____ Use CHG Soap or sage wipes as directed on instruction sheet   ____ Use inhalers on the day of surgery and bring to hospital day of surgery  ____ Stop metformin 2 days prior to surgery    ____ Take 1/2 of usual insulin dose the night before surgery and none on the morning of  surgery.   ____ Stop Aspirin, Coumadin, Pllavix ,Eliquis, Effient, or Pradaxa  x__ Stop Anti-inflammatories such as Advil, Aleve, Ibuprofen, Motrin, Naproxen,          Naprosyn, Goodies powders or aspirin products. Ok to take Tylenol.   ____ Stop supplements until after surgery.    ____ Bring C-Pap to the hospital.

## 2016-10-09 ENCOUNTER — Encounter
Admission: RE | Disposition: A | Discharge status: Home / Self Care | Payer: Self-pay | Source: Ambulatory Visit | Attending: Surgery

## 2016-10-09 ENCOUNTER — Ambulatory Visit: Payer: BLUE CROSS/BLUE SHIELD | Admitting: Certified Registered Nurse Anesthetist

## 2016-10-09 ENCOUNTER — Encounter: Payer: Self-pay | Admitting: *Deleted

## 2016-10-09 ENCOUNTER — Ambulatory Visit
Admission: RE | Admit: 2016-10-09 | Discharge: 2016-10-09 | Disposition: A | Discharge status: Home / Self Care | Payer: BLUE CROSS/BLUE SHIELD | Source: Ambulatory Visit | Attending: Surgery | Admitting: Surgery

## 2016-10-09 DIAGNOSIS — N529 Male erectile dysfunction, unspecified: Secondary | ICD-10-CM | POA: Diagnosis not present

## 2016-10-09 DIAGNOSIS — J449 Chronic obstructive pulmonary disease, unspecified: Secondary | ICD-10-CM | POA: Insufficient documentation

## 2016-10-09 DIAGNOSIS — H5462 Unqualified visual loss, left eye, normal vision right eye: Secondary | ICD-10-CM | POA: Diagnosis not present

## 2016-10-09 DIAGNOSIS — F17210 Nicotine dependence, cigarettes, uncomplicated: Secondary | ICD-10-CM | POA: Diagnosis not present

## 2016-10-09 DIAGNOSIS — Z8719 Personal history of other diseases of the digestive system: Secondary | ICD-10-CM | POA: Diagnosis not present

## 2016-10-09 DIAGNOSIS — Z882 Allergy status to sulfonamides status: Secondary | ICD-10-CM | POA: Insufficient documentation

## 2016-10-09 DIAGNOSIS — K219 Gastro-esophageal reflux disease without esophagitis: Secondary | ICD-10-CM | POA: Insufficient documentation

## 2016-10-09 DIAGNOSIS — K602 Anal fissure, unspecified: Secondary | ICD-10-CM | POA: Diagnosis not present

## 2016-10-09 DIAGNOSIS — Z91030 Bee allergy status: Secondary | ICD-10-CM | POA: Insufficient documentation

## 2016-10-09 DIAGNOSIS — Z885 Allergy status to narcotic agent status: Secondary | ICD-10-CM | POA: Diagnosis not present

## 2016-10-09 HISTORY — PX: BOTOX INJECTION: SHX5754

## 2016-10-09 HISTORY — PX: EVALUATION UNDER ANESTHESIA WITH ANAL FISSUROTOMY: SHX5622

## 2016-10-09 SURGERY — EXAM UNDER ANESTHESIA WITH ANAL FISSUROTOMY
Anesthesia: General | Wound class: Clean Contaminated

## 2016-10-09 MED ORDER — ONDANSETRON HCL 4 MG/2ML IJ SOLN: 4.0000 mg | Freq: Once | INTRAMUSCULAR | Status: DC | PRN

## 2016-10-09 MED ORDER — FENTANYL CITRATE (PF) 100 MCG/2ML IJ SOLN
INTRAMUSCULAR | Status: AC
  Administered 2016-10-09: 25 ug via INTRAVENOUS
  Filled 2016-10-09: qty 2

## 2016-10-09 MED ORDER — CHLORHEXIDINE GLUCONATE CLOTH 2 % EX PADS: 6.0000 | MEDICATED_PAD | Freq: Once | CUTANEOUS | Status: DC

## 2016-10-09 MED ORDER — FAMOTIDINE 20 MG PO TABS
20.0000 mg | ORAL_TABLET | Freq: Once | ORAL | Status: AC
  Administered 2016-10-09: 20 mg via ORAL

## 2016-10-09 MED ORDER — ONDANSETRON HCL 4 MG/2ML IJ SOLN
INTRAMUSCULAR | Status: DC | PRN
  Administered 2016-10-09: 4 mg via INTRAVENOUS

## 2016-10-09 MED ORDER — BUPIVACAINE LIPOSOME 1.3 % IJ SUSP
INTRAMUSCULAR | Status: AC
  Filled 2016-10-09: qty 20

## 2016-10-09 MED ORDER — DEXAMETHASONE SODIUM PHOSPHATE 10 MG/ML IJ SOLN
INTRAMUSCULAR | Status: DC | PRN
  Administered 2016-10-09: 4 mg via INTRAVENOUS

## 2016-10-09 MED ORDER — MIDAZOLAM HCL 2 MG/2ML IJ SOLN
INTRAMUSCULAR | Status: DC | PRN
  Administered 2016-10-09: 1 mg via INTRAVENOUS

## 2016-10-09 MED ORDER — LIDOCAINE HCL (CARDIAC) 20 MG/ML IV SOLN
INTRAVENOUS | Status: DC | PRN
  Administered 2016-10-09: 50 mg via INTRAVENOUS

## 2016-10-09 MED ORDER — OXYCODONE HCL 5 MG PO TABS
ORAL_TABLET | ORAL | Status: DC
Start: 2016-10-09 — End: 2016-10-09
  Filled 2016-10-09: qty 1

## 2016-10-09 MED ORDER — ACETAMINOPHEN 10 MG/ML IV SOLN
INTRAVENOUS | Status: AC
  Filled 2016-10-09: qty 100

## 2016-10-09 MED ORDER — LACTATED RINGERS IV SOLN
INTRAVENOUS | Status: DC
  Administered 2016-10-09: 11:00:00 via INTRAVENOUS

## 2016-10-09 MED ORDER — ONABOTULINUMTOXINA 100 UNITS IJ SOLR
INTRAMUSCULAR | Status: DC | PRN
  Administered 2016-10-09: 50 [IU] via INTRAMUSCULAR

## 2016-10-09 MED ORDER — PROPOFOL 10 MG/ML IV BOLUS
INTRAVENOUS | Status: DC | PRN
  Administered 2016-10-09: 150 mg via INTRAVENOUS

## 2016-10-09 MED ORDER — OXYCODONE HCL 5 MG PO TABS
5.0000 mg | ORAL_TABLET | Freq: Four times a day (QID) | ORAL | Status: DC | PRN
  Administered 2016-10-09: 5 mg via ORAL

## 2016-10-09 MED ORDER — FENTANYL CITRATE (PF) 100 MCG/2ML IJ SOLN
INTRAMUSCULAR | Status: DC | PRN
  Administered 2016-10-09 (×2): 50 ug via INTRAVENOUS

## 2016-10-09 MED ORDER — KETOROLAC TROMETHAMINE 30 MG/ML IJ SOLN
INTRAMUSCULAR | Status: DC | PRN
  Administered 2016-10-09: 30 mg via INTRAVENOUS

## 2016-10-09 MED ORDER — ACETAMINOPHEN 10 MG/ML IV SOLN
INTRAVENOUS | Status: DC | PRN
  Administered 2016-10-09: 1000 mg via INTRAVENOUS

## 2016-10-09 MED ORDER — ONABOTULINUMTOXINA 100 UNITS IJ SOLR
100.0000 [IU] | Freq: Once | INTRAMUSCULAR | Status: DC
  Filled 2016-10-09: qty 100

## 2016-10-09 MED ORDER — FAMOTIDINE 20 MG PO TABS
ORAL_TABLET | ORAL | Status: AC
  Administered 2016-10-09: 20 mg via ORAL
  Filled 2016-10-09: qty 1

## 2016-10-09 MED ORDER — GLYCOPYRROLATE 0.2 MG/ML IJ SOLN
INTRAMUSCULAR | Status: DC | PRN
  Administered 2016-10-09: 0.2 mg via INTRAVENOUS

## 2016-10-09 MED ORDER — BUPIVACAINE-EPINEPHRINE (PF) 0.25% -1:200000 IJ SOLN
INTRAMUSCULAR | Status: AC
  Filled 2016-10-09: qty 30

## 2016-10-09 MED ORDER — FENTANYL CITRATE (PF) 100 MCG/2ML IJ SOLN
25.0000 ug | INTRAMUSCULAR | Status: DC | PRN
  Administered 2016-10-09 (×4): 25 ug via INTRAVENOUS

## 2016-10-09 MED ORDER — OXYCODONE HCL 5 MG PO TABS: 5.0000 mg | ORAL_TABLET | Freq: Four times a day (QID) | ORAL | 0 refills | Status: DC | PRN

## 2016-10-09 SURGICAL SUPPLY — 16 items
CANISTER SUCT 1200ML W/VALVE (MISCELLANEOUS) ×3 IMPLANT
DRAPE LAPAROTOMY 100X77 ABD (DRAPES) ×3 IMPLANT
DRAPE LEGGINS SURG 28X43 STRL (DRAPES) ×3 IMPLANT
ELECT REM PT RETURN 9FT ADLT (ELECTROSURGICAL) ×3
ELECTRODE REM PT RTRN 9FT ADLT (ELECTROSURGICAL) ×1 IMPLANT
GLOVE BIO SURGEON STRL SZ7 (GLOVE) ×6 IMPLANT
GOWN STRL REUS W/ TWL LRG LVL3 (GOWN DISPOSABLE) ×2 IMPLANT
GOWN STRL REUS W/TWL LRG LVL3 (GOWN DISPOSABLE) ×4
JELLY LUB 2OZ STRL (MISCELLANEOUS) ×2
JELLY LUBE 2OZ STRL (MISCELLANEOUS) ×1 IMPLANT
NDL SAFETY 22GX1.5 (NEEDLE) ×3 IMPLANT
PACK BASIN MINOR ARMC (MISCELLANEOUS) ×3 IMPLANT
SOL PREP PVP 2OZ (MISCELLANEOUS) ×3
SOLUTION PREP PVP 2OZ (MISCELLANEOUS) ×1 IMPLANT
SPONGE LAP 18X18 5 PK (GAUZE/BANDAGES/DRESSINGS) ×3 IMPLANT
SYR 20CC LL (SYRINGE) ×6 IMPLANT

## 2016-10-09 NOTE — Anesthesia Procedure Notes (Signed)
Procedure Name: LMA Insertion Performed by: Melvinia Ashby Pre-anesthesia Checklist: Patient identified, Patient being monitored, Timeout performed, Emergency Drugs available and Suction available Patient Re-evaluated:Patient Re-evaluated prior to inductionOxygen Delivery Method: Circle system utilized Preoxygenation: Pre-oxygenation with 100% oxygen Intubation Type: IV induction Ventilation: Mask ventilation without difficulty LMA: LMA inserted LMA Size: 4.5 Tube type: Oral Number of attempts: 1 Placement Confirmation: positive ETCO2 and breath sounds checked- equal and bilateral Tube secured with: Tape Dental Injury: Teeth and Oropharynx as per pre-operative assessment      

## 2016-10-09 NOTE — Discharge Instructions (Signed)

## 2016-10-09 NOTE — Interval H&P Note (Signed)
History and Physical Interval Note:  10/09/2016 12:19 PM  Jeffrey Prince  has presented today for surgery, with the diagnosis of anal fissure  The various methods of treatment have been discussed with the patient and family. After consideration of risks, benefits and other options for treatment, the patient has consented to  Procedure(s) with comments: EXAM UNDER ANESTHESIA WITH ANAL FISSUROTOMY chemical sphincterotomy w BOtox. (N/A) - Lithotomy, make sure botox is in the room BOTOX INJECTION (N/A) as a surgical intervention .  The patient's history has been reviewed, patient examined, no change in status, stable for surgery.  I have reviewed the patient's chart and labs.  Questions were answered to the patient's satisfaction.     [place]

## 2016-10-09 NOTE — H&P (View-Only) (Signed)
Outpatient Surgical Follow Up  09/26/2016  Jeffrey Prince is an 50 y.o. male.   Chief Complaint  Patient presents with  . Follow-up    Hemorrhoids    HPI: f/u anal fissure, has tried nifedipine cream w/o major improvement, sitz baths. He has not started fiber nor stool softner, d/w him the importance of bowel habit regulation. Still experiences severe sharp intermittent pain anorectal area w some hematochezia. No fevers, or chills.   Past Medical History:  Diagnosis Date  . Acne   . Blind left eye    Since birth  . Chorea   . Diverticulosis   . ED (erectile dysfunction)   . GERD (gastroesophageal reflux disease)   . Hemorrhoids   . Weight loss    Abnormal    Past Surgical History:  Procedure Laterality Date  . COLONOSCOPY     with polyp removal  . HEMORRHOID SURGERY    . ROTATOR CUFF REPAIR  1991    Family History  Problem Relation Age of Onset  . Breast cancer Mother   . Huntington's disease Father   . Cancer Paternal Uncle     Colon  . Diabetes Son   . Cancer Maternal Grandfather   . Cancer Paternal Grandmother   . Heart disease Paternal Grandmother   . Huntington's disease Paternal Grandmother     Social History:  reports that he has been smoking Cigarettes.  He has a 10.00 pack-year smoking history. He has never used smokeless tobacco. He reports that he does not drink alcohol or use drugs.  Allergies:  Allergies  Allergen Reactions  . Bee Venom Swelling  . Codeine Diarrhea and Nausea Only  . Sulfa Antibiotics Nausea And Vomiting    Medications reviewed.    ROS Full ROS performed and is otherwise negative other than what is noted in HPI   BP 132/75   Pulse (!) 57   Temp 99.2 F (37.3 C) (Oral)   Ht 5\' 10"  (1.778 m)   Wt 64.5 kg (142 lb 3.2 oz)   BMI 20.40 kg/m   Physical Exam  Constitutional: He is oriented to person, place, and time and well-developed, well-nourished, and in no distress. No distress.  Neck: Neck supple. No JVD  present.  Cardiovascular: Normal rate and regular rhythm.   Pulmonary/Chest: Effort normal. No stridor. No respiratory distress.  Abdominal: Soft. He exhibits no distension. There is no tenderness.  Genitourinary:  Genitourinary Comments: There is a posterior midline fissure , very uncomfortable w tight sphincter, no masses  Musculoskeletal: Normal range of motion. He exhibits no edema.  Neurological: He is alert and oriented to person, place, and time. Gait normal. GCS score is 15.  Skin: Skin is warm and dry. He is not diaphoretic.  Psychiatric: Mood, memory, affect and judgment normal.  Nursing note and vitals reviewed.      No results found for this or any previous visit (from the past 48 hour(s)). No results found.  Assessment/Plan: Anal fissure not responsive to medical rx. D/W him again about stool regulation w fiber, h20, stool softner and miralax. D/W him about options and I do think he will benefit from chemical sphincterotomy w BOtox. D/W the pt in detail about the procedure, risks, benefits and possible complications including but not limited to: bleeding, infection, incontinence, persistent pain, re interventions. D/W him that we may be able to repeat it 2-3 times before we commit to standards surgical sphincterotomy. He understands.     Judeth Horn, MD FACS General  Surgeon  09/26/2016,10:37 AM

## 2016-10-09 NOTE — Op Note (Signed)
  10/09/2016  1:06 PM  PATIENT:  Charlie Pitter  50 y.o. male  PRE-OPERATIVE DIAGNOSIS:  Anal fissure  POST-OPERATIVE DIAGNOSIS:  Same  PROCEDURE:  Exam under anesthesia and Chemical Sphincterotomy  Using 50 IU BOtox   SURGEON:  Surgeon(s) and Role:    * Diego Sarita Haver, MD - Primary  FINDINGS: posterior midline fissure  ANESTHESIA: GETA  INDICATIONS FOR PROCEDURE fissure DICTATION:  Patient was playing about proceeding detail, risk benefits possible complications and a consent was obtained. The patient taken to the operating room and placed in the lithotomy position.  Exam revealed a posterior midline fissure no other pathology. Using bimanual technique I identified the sphincter and injected 50 IU of botox in the standard fashion. liposomic Marcaine was injected around the perineum. Needle and laparotomy counts were correct and there were no immediate complications  Jules Husbands, MD

## 2016-10-09 NOTE — Anesthesia Preprocedure Evaluation (Signed)
Anesthesia Evaluation  Patient identified by MRN, date of birth, ID band Patient awake    Reviewed: Allergy & Precautions, NPO status , Patient's Chart, lab work & pertinent test results  Airway Mallampati: II       Dental  (+) Teeth Intact   Pulmonary COPD, Current Smoker,     + decreased breath sounds      Cardiovascular Exercise Tolerance: Good  Rhythm:Regular Rate:Normal     Neuro/Psych Left blind eye negative neurological ROS     GI/Hepatic Neg liver ROS, GERD  Medicated,  Endo/Other  negative endocrine ROS  Renal/GU negative Renal ROS     Musculoskeletal   Abdominal   Peds  Hematology negative hematology ROS (+)   Anesthesia Other Findings   Reproductive/Obstetrics                             Anesthesia Physical Anesthesia Plan  ASA: II  Anesthesia Plan: General   Post-op Pain Management:    Induction: Intravenous  Airway Management Planned: LMA  Additional Equipment:   Intra-op Plan:   Post-operative Plan: Extubation in OR  Informed Consent: I have reviewed the patients History and Physical, chart, labs and discussed the procedure including the risks, benefits and alternatives for the proposed anesthesia with the patient or authorized representative who has indicated his/her understanding and acceptance.     Plan Discussed with: CRNA  Anesthesia Plan Comments:         Anesthesia Quick Evaluation

## 2016-10-09 NOTE — Transfer of Care (Signed)
Immediate Anesthesia Transfer of Care Note  Patient: DEVARUS HUBEL  Procedure(s) Performed: Procedure(s) with comments: EXAM UNDER ANESTHESIA WITH ANAL FISSUROTOMY chemical sphincterotomy w BOtox. (N/A) - Lithotomy, make sure botox is in the room BOTOX INJECTION (N/A)  Patient Location: PACU  Anesthesia Type:General  Level of Consciousness: sedated  Airway & Oxygen Therapy: Patient Spontanous Breathing and Patient connected to nasal cannula oxygen  Post-op Assessment: Report given to RN and Post -op Vital signs reviewed and stable  Post vital signs: Reviewed and stable  Last Vitals:  Vitals:   10/09/16 1023  BP: 128/81  Pulse: (!) 52  Resp: 16  Temp: 36.6 C    Last Pain:  Vitals:   10/09/16 1023  TempSrc: Oral         Complications: No apparent anesthesia complications

## 2016-10-09 NOTE — Anesthesia Postprocedure Evaluation (Signed)
Anesthesia Post Note  Patient: Jeffrey Prince  Procedure(s) Performed: Procedure(s) (LRB): EXAM UNDER ANESTHESIA WITH ANAL FISSUROTOMY chemical sphincterotomy w BOtox. (N/A) BOTOX INJECTION (N/A)  Patient location during evaluation: PACU Anesthesia Type: General Level of consciousness: awake Pain management: pain level controlled Vital Signs Assessment: post-procedure vital signs reviewed and stable Respiratory status: spontaneous breathing Cardiovascular status: stable Anesthetic complications: no    Last Vitals:  Vitals:   10/09/16 1348 10/09/16 1355  BP: (!) 153/67 122/77  Pulse: 63 (!) 52  Resp: 13 16  Temp: 36.2 C 36.4 C    Last Pain:  Vitals:   10/09/16 1413  TempSrc:   PainSc: 6                  VAN STAVEREN,Benji Poynter

## 2016-10-16 ENCOUNTER — Encounter: Payer: Self-pay | Admitting: Surgery

## 2016-10-16 ENCOUNTER — Ambulatory Visit: Payer: BLUE CROSS/BLUE SHIELD | Admitting: Surgery

## 2016-10-16 ENCOUNTER — Ambulatory Visit (INDEPENDENT_AMBULATORY_CARE_PROVIDER_SITE_OTHER): Payer: BLUE CROSS/BLUE SHIELD | Admitting: Surgery

## 2016-10-16 VITALS — BP 146/87 | HR 62 | Temp 99.1°F | Ht 70.0 in | Wt 138.6 lb

## 2016-10-16 DIAGNOSIS — Z09 Encounter for follow-up examination after completed treatment for conditions other than malignant neoplasm: Secondary | ICD-10-CM

## 2016-10-17 NOTE — Progress Notes (Signed)
S/p chemical sphincterotomy with Botox for anal fissure He feels better with improvement of pain. He does have some mild to moderate anorectal pain. Doing sitz baths and nifedipine cream   PE NAD  Abd : soft, NT Rectal: No evidence of perineal sepsis and no evidence of masses. The sphincter deftly feels softer and less tense than the last visit  A/p Doing doing well after sphincterotomy. Continue nifedipine cream, sitz baths. We will see him back in about a month or so. No need for any other surgical intervention at this time.

## 2016-10-18 ENCOUNTER — Telehealth: Payer: Self-pay

## 2016-10-18 NOTE — Telephone Encounter (Signed)
Patient called in with complaints of bright red bleeding in toilet only after using bathroom today. Patient is taking colace once daily. Patient has continued Coffey today.  Informed patient that he will need to continue using colace 100mg  BID and to Begin using Miralax 17g BID until stools are consistency of pudding. I informed patient that he would need to go to the Emergency Room only if his rectal bleeding is continuous and if he is changing a pad once every hour or more frequently.

## 2016-11-20 ENCOUNTER — Encounter: Payer: BLUE CROSS/BLUE SHIELD | Admitting: Surgery

## 2016-12-02 ENCOUNTER — Encounter: Payer: BLUE CROSS/BLUE SHIELD | Admitting: Surgery

## 2016-12-24 ENCOUNTER — Telehealth: Payer: Self-pay

## 2016-12-24 NOTE — Telephone Encounter (Signed)
Patient called office to cancel his appointment on 12/29/16 -DR.Pabon due to not having copay. He was asked to call and make an appointment when he has the co-pay per [employer]. Appointment cancelled at this time.

## 2016-12-25 ENCOUNTER — Encounter: Payer: Self-pay | Admitting: Surgery

## 2016-12-26 ENCOUNTER — Encounter: Payer: Self-pay | Admitting: Surgery

## 2017-01-15 ENCOUNTER — Telehealth: Payer: Self-pay

## 2017-01-15 NOTE — Telephone Encounter (Signed)
Patient has had several cancellations. 11/20/16, 12/02/16, 12/26/16 (personal reasons), and 12/25/16 (Provider). I have made him aware of his cancellations and let him know that if he cancels again, he will be discharged from the practice. Patient understood and an appointment has been made.

## 2017-01-17 ENCOUNTER — Encounter: Payer: Self-pay | Admitting: Emergency Medicine

## 2017-01-17 ENCOUNTER — Emergency Department
Admission: EM | Admit: 2017-01-17 | Discharge: 2017-01-17 | Disposition: A | Discharge status: Home / Self Care | Payer: BLUE CROSS/BLUE SHIELD | Attending: Emergency Medicine | Admitting: Emergency Medicine

## 2017-01-17 ENCOUNTER — Emergency Department: Payer: BLUE CROSS/BLUE SHIELD

## 2017-01-17 DIAGNOSIS — R1013 Epigastric pain: Secondary | ICD-10-CM | POA: Diagnosis present

## 2017-01-17 DIAGNOSIS — K2900 Acute gastritis without bleeding: Secondary | ICD-10-CM

## 2017-01-17 DIAGNOSIS — F17210 Nicotine dependence, cigarettes, uncomplicated: Secondary | ICD-10-CM | POA: Diagnosis not present

## 2017-01-17 LAB — CBC
HCT: 43.5 % (ref 40.0–52.0)
HEMOGLOBIN: 14.6 g/dL (ref 13.0–18.0)
MCH: 29 pg (ref 26.0–34.0)
MCHC: 33.5 g/dL (ref 32.0–36.0)
MCV: 86.8 fL (ref 80.0–100.0)
PLATELETS: 237 10*3/uL (ref 150–440)
RBC: 5.02 MIL/uL (ref 4.40–5.90)
RDW: 13.9 % (ref 11.5–14.5)
WBC: 4 10*3/uL (ref 3.8–10.6)

## 2017-01-17 LAB — COMPREHENSIVE METABOLIC PANEL
ALBUMIN: 4.4 g/dL (ref 3.5–5.0)
ALT: 21 U/L (ref 17–63)
AST: 22 U/L (ref 15–41)
Alkaline Phosphatase: 45 U/L (ref 38–126)
Anion gap: 5 (ref 5–15)
BUN: 9 mg/dL (ref 6–20)
CHLORIDE: 105 mmol/L (ref 101–111)
CO2: 27 mmol/L (ref 22–32)
CREATININE: 0.35 mg/dL — AB (ref 0.61–1.24)
Calcium: 9.7 mg/dL (ref 8.9–10.3)
GFR calc non Af Amer: >60 mL/min (ref >60)
GLUCOSE: 98 mg/dL (ref 65–99)
Potassium: 3.7 mmol/L (ref 3.5–5.1)
SODIUM: 137 mmol/L (ref 135–145)
Total Bilirubin: 0.9 mg/dL (ref 0.3–1.2)
Total Protein: 8.3 g/dL — ABNORMAL HIGH (ref 6.5–8.1)

## 2017-01-17 LAB — URINALYSIS, COMPLETE (UACMP) WITH MICROSCOPIC
Bilirubin Urine: NEGATIVE
Glucose, UA: NEGATIVE mg/dL
Hgb urine dipstick: NEGATIVE
KETONES UR: NEGATIVE mg/dL
Leukocytes, UA: NEGATIVE
Nitrite: NEGATIVE
PROTEIN: NEGATIVE mg/dL
Specific Gravity, Urine: 1.017 (ref 1.005–1.030)
pH: 6 (ref 5.0–8.0)

## 2017-01-17 LAB — LIPASE, BLOOD: LIPASE: 11 U/L (ref 11–51)

## 2017-01-17 MED ORDER — PANTOPRAZOLE SODIUM 20 MG PO TBEC: 20.0000 mg | DELAYED_RELEASE_TABLET | Freq: Every day | ORAL | 1 refills | Status: DC

## 2017-01-17 MED ORDER — GI COCKTAIL ~~LOC~~
30.0000 mL | Freq: Once | ORAL | Status: AC
  Administered 2017-01-17: 30 mL via ORAL
  Filled 2017-01-17: qty 30

## 2017-01-17 NOTE — ED Notes (Signed)
Pt discharged home after verbalizing understanding of discharge instructions; nad noted. 

## 2017-01-17 NOTE — ED Triage Notes (Signed)
Patient presents to ED via POV from home with c/o abdominal pain x 2 weeks. A&O x4. Ambulatory. Denies N/V/D.

## 2017-01-17 NOTE — Discharge Instructions (Signed)
Please contact your primary physician for further outpatient follow-up. He can take over-the-counter antacid medication such as Maalox, Mylanta, or Tums. Please avoid Naprosyn, ibuprofen and related medications and CT scan your teachers stomach. Continue with Tylenol for pain and you can take 2 extra strength Tylenol every 6-8 hours and that is safe for her discomfort. He turned the emergency department especially for fever, persistent lower abdominal pain, or any other new concerns. Please follow-up with the surgical group for concerns for her hemorrhoids.  Please return immediately if condition worsens. Please contact her primary physician or the physician you were given for referral. If you have any specialist physicians involved in her treatment and plan please also contact them. Thank you for using [facility].

## 2017-01-17 NOTE — ED Notes (Signed)
Pt presents with abdominal pain x 2 weeks. Pt points to upper abdomen when asked to localize the pain, however is tender in all 4 quadrants. He does feel like it is worse in the epigastric area. Pt alert & oriented with NAD noted.

## 2017-01-17 NOTE — ED Provider Notes (Signed)
Time Seen: Approximately *1421  I have reviewed the triage notes  Chief Complaint: Abdominal Pain   History of Present Illness: Jeffrey Prince is a 51 y.o. male  who presents with abdominal pain now for the past 2 weeks. He points primarily to the epigastric area. He denies any fever, nausea, vomiting, blood in the stool. He denies any chest pain or radiation of pain to the back region. He states is relatively constant can't identify any exacerbating or relieving factors. He's been taking Tylenol at home without significant relief. Patient had recent surgical correction for what sounds like some external hemorrhoids and states he had some injection there and he is also had some ongoing discomfort in that area.  Past Medical History:  Diagnosis Date  . Acne   . Blind left eye    Since birth  . Chorea   . Diverticulosis   . ED (erectile dysfunction)   . GERD (gastroesophageal reflux disease)   . Hemorrhoids   . Weight loss    Abnormal    Patient Active Problem List   Diagnosis Date Noted  . Unintentional weight loss 05/16/2015  . Diarrhea 05/16/2015  . GERD (gastroesophageal reflux disease) 05/16/2015  . Diffuse abdominal pain 05/16/2015  . Dyslipidemia (high LDL; low HDL) 01/25/2015  . Decreased visual acuity 01/24/2015  . Erectile dysfunction 01/24/2015  . Current every day smoker 01/24/2015  . 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 Hemorrhoids 03/16/2014    Past Surgical History:  Procedure Laterality Date  . BOTOX INJECTION N/A 10/09/2016   Procedure: BOTOX INJECTION;  Surgeon: Jules Husbands, MD;  Location: [facility] ORS;  Service: General;  Laterality: N/A;  . COLONOSCOPY     with polyp removal  . EVALUATION UNDER ANESTHESIA WITH ANAL FISSUROTOMY N/A 10/09/2016   Procedure: EXAM UNDER ANESTHESIA WITH ANAL FISSUROTOMY chemical sphincterotomy w BOtox.;  Surgeon: Jules Husbands, MD;  Location: [facility] OR[facility];  Service: General;  Laterality: N/A;  Lithotomy, make sure  botox is in the room  . HEMORRHOID SURGERY    . ROTATOR CUFF REPAIR  1991    Past Surgical History:  Procedure Laterality Date  . BOTOX INJECTION N/A 10/09/2016   Procedure: BOTOX INJECTION;  Surgeon: Jules Husbands, MD;  Location: [facility] ORS;  Service: General;  Laterality: N/A;  . COLONOSCOPY     with polyp removal  . EVALUATION UNDER ANESTHESIA WITH ANAL FISSUROTOMY N/A 10/09/2016   Procedure: EXAM UNDER ANESTHESIA WITH ANAL FISSUROTOMY chemical sphincterotomy w BOtox.;  Surgeon: Jules Husbands, MD;  Location: [facility] OR[facility];  Service: General;  Laterality: N/A;  Lithotomy, make sure botox is in the room  . HEMORRHOID SURGERY    . ROTATOR CUFF REPAIR  1991    Current Outpatient Rx  . Order #: [id number] Class: Normal  . Order #: [id number] Class: Historical Med  . Order #: [id number] Class: Print  . Order #: [id number] Class: Print  . Order #: [id number] Class: Normal  . Order #: [id number] Class: Normal  . Order #: [id number] Class: Historical Med    Allergies:  Bee venom; Codeine; and Sulfa antibiotics  Family History: Family History  Problem Relation Age of Onset  . Breast cancer Mother   . Huntington's disease Father   . Cancer Paternal Uncle     Colon  . Diabetes Son   . Cancer Maternal Grandfather   . Cancer Paternal Grandmother   . Heart disease Paternal Grandmother   . Huntington's disease Paternal Grandmother     Social History: Social History  Substance  Use Topics  . Smoking status: Current Every Day Smoker    Packs/day: 0.50    Years: 20.00    Types: Cigarettes  . Smokeless tobacco: Never Used  . Alcohol use No     Review of Systems:   10 point review of systems was performed and was otherwise negative:  Constitutional: No fever Eyes: No visual disturbances ENT: No sore throat, ear pain Cardiac: No chest pain Respiratory: No shortness of breath, wheezing, or stridor Abdomen: Epigastric abdominal pain without nausea, vomiting or diarrhea Endocrine: No weight loss,  No night sweats Extremities: No peripheral edema, cyanosis Skin: No rashes, easy bruising Neurologic: No focal weakness, trouble with speech or swollowing Urologic: No dysuria, Hematuria, or urinary frequency   Physical Exam:  ED Triage Vitals  Enc Vitals Group     BP 01/17/17 1125 (!) 146/111     Pulse Rate 01/17/17 1125 (!) 110     Resp 01/17/17 1125 17     Temp 01/17/17 1125 99 F (37.2 C)     Temp Source 01/17/17 1125 Oral     SpO2 01/17/17 1125 96 %     Weight 01/17/17 1126 140 lb (63.5 kg)     Height 01/17/17 1126 5\' 10"  (1.778 m)     Head Circumference --      Peak Flow --      Pain Score 01/17/17 1127 9     Pain Loc --      Pain Edu? --      Excl. in [place]? --     General: Awake , Alert , and Oriented times 3; GCS 15 Head: Normal cephalic , atraumatic Eyes: Pupils equal , round, reactive to light Nose/Throat: No nasal drainage, patent upper airway without erythema or exudate.  Neck: Supple, Full range of motion, No anterior adenopathy or palpable thyroid masses Lungs: Clear to ascultation without wheezes , rhonchi, or rales Heart: Regular rate, regular rhythm without murmurs , gallops , or rubs Abdomen: Mild tenderness in the epigastric area without rebound, guarding, rigidity. No focal tenderness over McBurney's point, negative Murphy's sign    Extremities: 2 plus symmetric pulses. No edema, clubbing or cyanosis Neurologic: normal ambulation, Motor symmetric without deficits, sensory intact Skin: warm, dry, no rashes Rectal exam is guaiac negative, normal sphincter tone, no visible or palpable hemorrhoids  Labs:   All laboratory work was reviewed including any pertinent negatives or positives listed below:  Labs Reviewed  COMPREHENSIVE METABOLIC PANEL - Abnormal; Notable for the following:       Result Value   Creatinine, Ser 0.35 (*)    Total Protein 8.3 (*)    All other components within normal limits  URINALYSIS, COMPLETE (UACMP) WITH MICROSCOPIC - Abnormal;  Notable for the following:    Color, Urine YELLOW (*)    APPearance CLEAR (*)    Bacteria, UA RARE (*)    Squamous Epithelial / LPF 0-5 (*)    All other components within normal limits  LIPASE, BLOOD  CBC    Radiology:  "US Abdomen Limited Ruq  Result Date: 01/17/2017 CLINICAL DATA:  Epigastric pain EXAM: US ABDOMEN LIMITED - RIGHT UPPER QUADRANT COMPARISON:  None. FINDINGS: Gallbladder: No gallstones or wall thickening visualized. No sonographic Murphy sign noted by sonographer. Common bile duct: Diameter: 2.8 cm mm. Liver: No focal lesion identified. Within normal limits in parenchymal echogenicity. IMPRESSION: No acute abnormality noted. Electronically Signed   By: Inez Catalina M.D.   On: 01/17/2017 14:52  "  I personally reviewed the radiologic studies     ED Course:  Patient's stay here was uneventful and he had relief with a GI cocktail. Differential diagnosis includes but is not exclusive to acute cholecystitis, intrathoracic causes for epigastric abdominal pain, gastritis, duodenitis, pancreatitis, small bowel or large bowel obstruction, abdominal aortic aneurysm, hernia, gastritis, etc. Given the patient's current clinical presentation and objective findings I felt he had some gastritis.     Final Clinical Impression: *  Final diagnoses:  Acute epigastric pain  Acute gastritis without hemorrhage, unspecified gastritis type     Plan: * Outpatient " New Prescriptions   PANTOPRAZOLE (PROTONIX) 20 MG TABLET    Take 1 tablet (20 mg total) by mouth daily.  " Patient was advised to return immediately if condition worsens. Patient was advised to follow up with their primary care physician or other specialized physicians involved in their outpatient care. The patient and/or family member/power of attorney had laboratory results reviewed at the bedside. All questions and concerns were addressed and appropriate discharge instructions were distributed by the nursing  staff.             Daymon Larsen, MD 01/17/17 [id number]

## 2017-01-21 ENCOUNTER — Other Ambulatory Visit: Payer: Self-pay | Admitting: Surgery

## 2017-01-21 NOTE — Telephone Encounter (Signed)
Returned phone call to patient at this time. No answer. Explained on voicemail that I could not refill patient's medication until he has been seen back in the office. We have not seen patient since 10/2016 and he has cancelled the last 4 appointments that he has had.

## 2017-02-05 ENCOUNTER — Other Ambulatory Visit: Payer: Self-pay

## 2017-02-10 ENCOUNTER — Telehealth: Payer: Self-pay

## 2017-02-10 ENCOUNTER — Ambulatory Visit (INDEPENDENT_AMBULATORY_CARE_PROVIDER_SITE_OTHER): Payer: BLUE CROSS/BLUE SHIELD | Admitting: Surgery

## 2017-02-10 ENCOUNTER — Encounter: Payer: Self-pay | Admitting: Surgery

## 2017-02-10 VITALS — BP 120/80 | HR 52 | Temp 98.2°F | Ht 70.0 in | Wt 133.6 lb

## 2017-02-10 DIAGNOSIS — K602 Anal fissure, unspecified: Secondary | ICD-10-CM | POA: Diagnosis not present

## 2017-02-10 NOTE — Progress Notes (Signed)
Outpatient Surgical Follow Up  02/10/2017  Jeffrey Prince is an 51 y.o. male.   Chief Complaint  Patient presents with  . Follow-up    Anal Fissure    HPI: Following up for anal fissures status post Botox injection and overall improving. Some occasional discomfort. No hematochezia. No other complaints  Past Medical History:  Diagnosis Date  . Acne   . Blind left eye    Since birth  . Chorea   . Diverticulosis   . ED (erectile dysfunction)   . GERD (gastroesophageal reflux disease)   . Hemorrhoids   . Weight loss    Abnormal    Past Surgical History:  Procedure Laterality Date  . BOTOX INJECTION N/A 10/09/2016   Procedure: BOTOX INJECTION;  Surgeon: Jules Husbands, MD;  Location: [facility];  Service: General;  Laterality: N/A;  . COLONOSCOPY     with polyp removal  . EVALUATION UNDER ANESTHESIA WITH ANAL FISSUROTOMY N/A 10/09/2016   Procedure: EXAM UNDER ANESTHESIA WITH ANAL FISSUROTOMY chemical sphincterotomy w BOtox.;  Surgeon: Jules Husbands, MD;  Location: [facility] OR[facility];  Service: General;  Laterality: N/A;  Lithotomy, make sure botox is in the room  . HEMORRHOID SURGERY    . ROTATOR CUFF REPAIR  1991    Family History  Problem Relation Age of Onset  . Breast cancer Mother   . Huntington's disease Father   . Cancer Paternal Uncle     Colon  . Diabetes Son   . Cancer Maternal Grandfather   . Cancer Paternal Grandmother   . Heart disease Paternal Grandmother   . Huntington's disease Paternal Grandmother     Social History:  reports that he has been smoking Cigarettes.  He has a 10.00 pack-year smoking history. He has never used smokeless tobacco. He reports that he does not drink alcohol or use drugs.  Allergies:  Allergies  Allergen Reactions  . Bee Venom Swelling  . Codeine Diarrhea and Nausea Only  . Sulfa Antibiotics Nausea And Vomiting    Medications reviewed.    ROS Full ROS performed and is otherwise negative other than stated in HPI   BP 120/80    Pulse (!) 52   Temp 98.2 F (36.8 C) (Oral)   Ht 5\' 10"  (1.778 m)   Wt 60.6 kg (133 lb 9.6 oz)   BMI 19.17 kg/m   Physical Exam  Constitutional: He is oriented to person, place, and time and well-developed, well-nourished, and in no distress. No distress.  Abdominal: Soft. He exhibits no distension. There is no tenderness. There is no rebound and no guarding.  Genitourinary:  Genitourinary Comments: Healed fissure, no masses or other pathology  Musculoskeletal: Normal range of motion. He exhibits no edema.  Neurological: He is alert and oriented to person, place, and time. Gait normal. GCS score is 15.  Skin: Skin is warm and dry. He is not diaphoretic.  Psychiatric: Mood, memory, affect and judgment normal.  Nursing note and vitals reviewed.      No results found for this or any previous visit (from the past 48 hour(s)). No results found.  Assessment/Plan:  1. Anal fissure improving. Continue medical rx w nifedipine, stools softener, and sitz baths RTC prn      Caroleen Hamman, MD FACS General Surgeon  02/10/2017,2:19 PM

## 2017-02-10 NOTE — Addendum Note (Signed)
Addended by: Caroleen Hamman F on: 02/10/2017 02:26 PM   Modules accepted: Level of Service

## 2017-02-10 NOTE — Telephone Encounter (Signed)
Nifedipine cream called to [facility] at this time.

## 2017-02-10 NOTE — Patient Instructions (Signed)
You may pick up your medicine at the pharmacy. Please call them before going to make sure it is ready. [phone].   Please call our office if you have questions or concerns.

## 2017-04-17 ENCOUNTER — Other Ambulatory Visit: Payer: Self-pay

## 2017-04-21 ENCOUNTER — Ambulatory Visit (INDEPENDENT_AMBULATORY_CARE_PROVIDER_SITE_OTHER): Payer: BLUE CROSS/BLUE SHIELD | Admitting: Surgery

## 2017-04-21 ENCOUNTER — Encounter: Payer: Self-pay | Admitting: Surgery

## 2017-04-21 ENCOUNTER — Telehealth: Payer: Self-pay

## 2017-04-21 VITALS — BP 117/72 | HR 54 | Temp 98.2°F | Wt 136.0 lb

## 2017-04-21 DIAGNOSIS — K602 Anal fissure, unspecified: Secondary | ICD-10-CM

## 2017-04-21 MED ORDER — NITROGLYCERIN 0.4 % RE OINT: 1.0000 "application " | TOPICAL_OINTMENT | Freq: Two times a day (BID) | RECTAL | 1 refills | Status: DC

## 2017-04-21 MED ORDER — DILTIAZEM GEL 2 %: 1.0000 "application " | Freq: Three times a day (TID) | CUTANEOUS | 1 refills | Status: DC

## 2017-04-21 NOTE — Telephone Encounter (Signed)
Patient was seen today in the office and is calling because his insurance is not covering the Nitroglycerine Cream that was prescribed to him. He would like to know if you can send something else to the pharmacy. Please call patient and advice.

## 2017-04-21 NOTE — Telephone Encounter (Signed)
Called patient back to let him know that a new prescription was sent to his pharmacy. Patient was told to apply the gel three times a day. Patient understood and had no further questions.

## 2017-04-21 NOTE — Progress Notes (Signed)
Outpatient Surgical Follow Up  04/21/2017  Jeffrey Prince is an 51 y.o. male.   Chief Complaint  Patient presents with  . Follow-up     Anal Fissure    HPI: F/U anorectal pain. Refers that he now has some abdominal pain when he has a bm, pain is moderate and intermittent. Some weight loss. Has been seen by neurology for some cognitive deficits. Doing sitz baths and using nifedipine cream. He did have a CT scan last year w/o acute abnormalities. Normal colonoscopy less 1 1/2 year ago.  Past Medical History:  Diagnosis Date  . Acne   . Blind left eye    Since birth  . Chorea   . Diverticulosis   . ED (erectile dysfunction)   . GERD (gastroesophageal reflux disease)   . Hemorrhoids   . Weight loss    Abnormal    Past Surgical History:  Procedure Laterality Date  . BOTOX INJECTION N/A 10/09/2016   Procedure: BOTOX INJECTION;  Surgeon: Jules Husbands, MD;  Location: [facility] OR[facility];  Service: General;  Laterality: N/A;  . COLONOSCOPY     with polyp removal  . EVALUATION UNDER ANESTHESIA WITH ANAL FISSUROTOMY N/A 10/09/2016   Procedure: EXAM UNDER ANESTHESIA WITH ANAL FISSUROTOMY chemical sphincterotomy w BOtox.;  Surgeon: Jules Husbands, MD;  Location: [facility] OR[facility];  Service: General;  Laterality: N/A;  Lithotomy, make sure botox is in the room  . HEMORRHOID SURGERY    . ROTATOR CUFF REPAIR  1991    Family History  Problem Relation Age of Onset  . Breast cancer Mother   . Huntington's disease Father   . Cancer Paternal Uncle        Colon  . Diabetes Son   . Cancer Maternal Grandfather   . Cancer Paternal Grandmother   . Heart disease Paternal Grandmother   . Huntington's disease Paternal Grandmother     Social History:  reports that he has been smoking Cigarettes.  He has a 10.00 pack-year smoking history. He has never used smokeless tobacco. He reports that he does not drink alcohol or use drugs.  Allergies:  Allergies  Allergen Reactions  . Bee Venom Swelling  . Codeine  Diarrhea and Nausea Only  . Sulfa Antibiotics Nausea And Vomiting    Medications reviewed.    ROS Full ROS performed and is otherwise negative other than what is stated in HPI   BP 117/72   Pulse (!) 54   Temp 98.2 F (36.8 C) (Oral)   Wt 61.7 kg (136 lb)   BMI 19.51 kg/m   Physical Exam  NAD awake and alert Abd: soft, NT, no peritonitis or masses Rectal: no hemorrhoids, no masses, healed post fissure, mild tenderness upon rectal exam.. No other anatomic lesions appreciated Neuro: GCS 15, no motor or sens deficits   No results found for this or any previous visit (from the past 48 hour(s)). No results found.  Assessment/Plan: Anorectal pain, physical findings do not explain his sxs. We will refer to GI for colonoscopy. Change to nitroglycerine cream to see if we can get better symptom relief. No surgical indication at this time  Caroleen Hamman, MD [place] Surgeon

## 2017-04-21 NOTE — Patient Instructions (Signed)
We will refer you to see the Gastroenterologist so they could do a Colonoscopy and then we will see you back afterwards.

## 2017-04-22 ENCOUNTER — Encounter: Payer: Self-pay | Admitting: Gastroenterology

## 2017-05-15 ENCOUNTER — Ambulatory Visit: Payer: BLUE CROSS/BLUE SHIELD | Admitting: Gastroenterology

## 2017-06-11 ENCOUNTER — Ambulatory Visit (INDEPENDENT_AMBULATORY_CARE_PROVIDER_SITE_OTHER): Payer: BLUE CROSS/BLUE SHIELD | Admitting: Gastroenterology

## 2017-06-11 NOTE — Progress Notes (Incomplete)
He has been referred by Dr Dahlia Byes for a colonoscopy for weight loss and abdominal pain .    Review of his weight shows that he weighed 133 lbs on 02/10/17 and today he weighs. Ct scan of the abdomen 04/2016 showed rectal prolapse .

## 2017-06-23 ENCOUNTER — Encounter: Payer: Self-pay | Admitting: Gastroenterology

## 2017-06-23 ENCOUNTER — Ambulatory Visit (INDEPENDENT_AMBULATORY_CARE_PROVIDER_SITE_OTHER): Payer: BLUE CROSS/BLUE SHIELD | Admitting: Gastroenterology

## 2017-06-23 VITALS — BP 118/66 | HR 51 | Temp 98.7°F | Ht 70.0 in | Wt 143.0 lb

## 2017-06-23 DIAGNOSIS — R109 Unspecified abdominal pain: Secondary | ICD-10-CM | POA: Diagnosis not present

## 2017-06-23 MED ORDER — DICYCLOMINE HCL 20 MG PO TABS: 20.0000 mg | ORAL_TABLET | Freq: Three times a day (TID) | ORAL | 2 refills | Status: DC

## 2017-06-23 NOTE — Progress Notes (Signed)
Gastroenterology Consultation  Referring Provider:     McLean-Scocuzza, Olivia Mackie * Primary Care Physician:  McLean-Scocuzza, Nino Glow, MD Primary Gastroenterologist:  Dr. Allen Norris     Reason for Consultation:     Abdominal pain and weight loss        HPI:   Jeffrey Prince is a 51 y.o. y/o male referred for consultation & management of Abdominal pain and weight loss by Dr. Terese Door, Nino Glow, MD.  This patient was sent to me after being seen by surgery for rectal pain. The patient had a colonoscopy and EGD in [place] 1-1/2 years ago at that time nothing was found to explain his symptoms. The patient was also seen at that time for weight loss. It appears that back in 2016 the patient was also seen by Dr. Pat Patrick for hemorrhoidal surgery and at that time he was 163 pounds. In July of that same year he was 147 pounds. The patient now comes in today and 143 pounds. The patient reports weight loss but then states that the weight goes up and down. He was started on hyoscyamine for his abdominal pain and states that it helps but not for very long and does not eliminate the pain. The patient is unable to tell me if the pain is made better or worse with any activities. He also denies any foods that make his pain better or worse. There is no report of any change in bowel habits. The patient has also had multiple CT scans in the past that did not show any cause for his symptoms. The patient was also seen by Dr. Gustavo Lah [facility] back in 2016. He was also seen by his nurse practitioner few times.  Past Medical History:  Diagnosis Date  . Acne   . Blind left eye    Since birth  . Chorea   . Diverticulosis   . ED (erectile dysfunction)   . GERD (gastroesophageal reflux disease)   . Hemorrhoids   . Weight loss    Abnormal    Past Surgical History:  Procedure Laterality Date  . BOTOX INJECTION N/A 10/09/2016   Procedure: BOTOX INJECTION;  Surgeon: Jules Husbands, MD;  Location: [facility] ORS;   Service: General;  Laterality: N/A;  . COLONOSCOPY     with polyp removal  . EVALUATION UNDER ANESTHESIA WITH ANAL FISSUROTOMY N/A 10/09/2016   Procedure: EXAM UNDER ANESTHESIA WITH ANAL FISSUROTOMY chemical sphincterotomy w BOtox.;  Surgeon: Jules Husbands, MD;  Location: [facility] ORS;  Service: General;  Laterality: N/A;  Lithotomy, make sure botox is in the room  . HEMORRHOID SURGERY    . ROTATOR CUFF REPAIR  1991    Prior to Admission medications   Medication Sig Start Date End Date Taking? Authorizing Provider  hyoscyamine (ANASPAZ) 0.125 MG TBDP disintergrating tablet Place 0.125 mg under the tongue every 4 (four) hours as needed for cramping.    Yes [provider]  ranitidine (ZANTAC) 150 MG tablet Take 1 tablet (150 mg total) by mouth 2 (two) times daily. 12/14/15  Yes Funches, Josalyn, MD  tadalafil (CIALIS) 10 MG tablet Take 10 mg by mouth daily as needed for erectile dysfunction.   Yes [provider]  cyclobenzaprine (FLEXERIL) 10 MG tablet Take by mouth. 03/04/17 08/31/17  [provider]  diltiazem 2 % GEL Apply 1 application topically 3 (three) times daily. Apply gel topically three times a day on your rectal area. Patient not taking: Reported on 06/23/2017 04/21/17  Pabon, Diego F, MD  docusate sodium (COLACE) 100 MG capsule Take 1 capsule (100 mg total) by mouth 2 (two) times daily. Patient not taking: Reported on 06/23/2017 09/24/16   Caroleen Hamman F, MD  Nitroglycerin 0.4 % OINT Place 1 application rectally 2 (two) times daily. Patient not taking: Reported on 06/23/2017 04/21/17   Caroleen Hamman F, MD  omeprazole (PRILOSEC) 20 MG capsule Take by mouth.    [provider]  polyethylene glycol (MIRALAX / GLYCOLAX) packet Take 17 g by mouth 2 (two) times daily. Patient not taking: Reported on 06/23/2017 09/24/16   Caroleen Hamman F, MD  sildenafil (VIAGRA) 25 MG tablet Take 25 mg by mouth daily as needed for erectile dysfunction.    [provider]     Family History  Problem Relation Age of Onset  . Breast cancer Mother   . Huntington's disease Father   . Cancer Paternal Uncle        Colon  . Diabetes Son   . Cancer Maternal Grandfather   . Cancer Paternal Grandmother   . Heart disease Paternal Grandmother   . Huntington's disease Paternal Grandmother      Social History  Substance Use Topics  . Smoking status: Current Every Day Smoker    Packs/day: 0.50    Years: 20.00    Types: Cigarettes  . Smokeless tobacco: Never Used  . Alcohol use No    Allergies as of 06/23/2017 - Review Complete 06/23/2017  Allergen Reaction Noted  . Bee venom Swelling 05/08/2015  . Codeine Diarrhea and Nausea Only 10/05/2014  . Sulfa antibiotics Nausea And Vomiting 09/09/2016    Review of Systems:    All systems reviewed and negative except where noted in HPI.   Physical Exam:  BP 118/66   Pulse (!) 51   Temp 98.7 F (37.1 C) (Oral)   Ht 5\' 10"  (1.778 m)   Wt 143 lb (64.9 kg)   BMI 20.52 kg/m  No LMP for male patient. Psych:  Alert and cooperative. Normal mood and affect. General:   Alert,  Well-developed, well-nourished, pleasant and cooperative in NAD Head:  Normocephalic and atraumatic. Eyes:  Sclera clear, no icterus.   Conjunctiva pink. Ears:  Normal auditory acuity. Nose:  No deformity, discharge, or lesions. Mouth:  No deformity or lesions,oropharynx pink & moist. Neck:  Supple; no masses or thyromegaly. Lungs:  Respirations even and unlabored.  Clear throughout to auscultation.   No wheezes, crackles, or rhonchi. No acute distress. Heart:  Regular rate and rhythm; no murmurs, clicks, rubs, or gallops. Abdomen:  Normal bowel sounds.  No bruits.  Soft, with tenderness to palpation with the patient's legs up and with the patient's legs down and non-distended without masses, hepatosplenomegaly or hernias noted.  No guarding or rebound tenderness.  Negative Carnett sign.   Rectal:  Deferred.  Msk:  Symmetrical without gross  deformities.  Good, equal movement & strength bilaterally. Pulses:  Normal pulses noted. Extremities:  No clubbing or edema.  No cyanosis. Neurologic:  Alert and oriented x3;  grossly normal neurologically. Skin:  Intact without significant lesions or rashes.  No jaundice. Lymph Nodes:  No significant cervical adenopathy. Psych:  Alert and cooperative. Normal mood and affect.  Imaging Studies: No results found.  Assessment and Plan:   JAPHETH DIEKMAN is a 51 y.o. y/o male who comes here for weight loss but it appears in 2016 he was only 4 pounds heavier than he is today. The patient has had multiple  CT scans and EGD and colonoscopy in the past that were normal. These procedures were done approximately 1-1/2 years ago. The patient has seen multiple gastrologist including a gastrostomy [facility] and in [place]. The patient has had some relief with hyoscyamine. The patient will stop that medication and start on dicyclomine 20 mg 3 times a day. Although I have not certain that his abdominal pain is GI related since it is not associated with eating drinking or bowel movements eyes will still have him set up for a small bowel follow-through to make sure there is no pathology in the small bowel such as adhesions or obstructions. The patient has been told to follow-up with surgery for his rectal pain. I do not think that a repeat colonoscopy at this time is going to give [place] much information as to the etiology of his symptoms.  Lucilla Lame, MD. Mar[facility] Regal   Note: This dictation was prepared with Dragon dictation along with smaller phrase technology. Any transcriptional errors that result from this process are unintentional.

## 2017-06-23 NOTE — Patient Instructions (Signed)
You are scheduled for a small bowel follow through at [facility] on Friday, August 24th at 9:30am. Please arrive 9:15am and check into the medical mall. You cannot have anything to eat or drink 4 hours prior to scan.   If you need to reschedule this appointment for any reason, please contact central scheduling at [phone].

## 2017-06-27 ENCOUNTER — Ambulatory Visit
Admission: RE | Admit: 2017-06-27 | Discharge: 2017-06-27 | Disposition: A | Discharge status: Home / Self Care | Payer: BLUE CROSS/BLUE SHIELD | Source: Ambulatory Visit | Attending: Gastroenterology | Admitting: Gastroenterology

## 2017-06-27 DIAGNOSIS — K219 Gastro-esophageal reflux disease without esophagitis: Secondary | ICD-10-CM | POA: Diagnosis not present

## 2017-06-27 DIAGNOSIS — R109 Unspecified abdominal pain: Secondary | ICD-10-CM | POA: Diagnosis present

## 2017-06-30 ENCOUNTER — Other Ambulatory Visit: Payer: Self-pay

## 2017-06-30 ENCOUNTER — Ambulatory Visit (INDEPENDENT_AMBULATORY_CARE_PROVIDER_SITE_OTHER): Payer: BLUE CROSS/BLUE SHIELD | Admitting: Surgery

## 2017-06-30 ENCOUNTER — Encounter: Payer: Self-pay | Admitting: Surgery

## 2017-06-30 DIAGNOSIS — K602 Anal fissure, unspecified: Secondary | ICD-10-CM | POA: Diagnosis not present

## 2017-06-30 MED ORDER — ONABOTULINUMTOXINA 100 UNITS IJ SOLR: 50.0000 [IU] | Freq: Once | INTRAMUSCULAR | Status: DC

## 2017-06-30 MED ORDER — NITROGLYCERIN 0.4 % RE OINT: 1.0000 "application " | TOPICAL_OINTMENT | Freq: Two times a day (BID) | RECTAL | 1 refills | Status: DC

## 2017-06-30 NOTE — Patient Instructions (Addendum)
We have sent in a refill of the Nifedipine cream to [facility]. Below is the address to [place], [place] [id number]  We have also scheduled for you to have injections with Dr. Dahlia Byes on 9/13.  If you have any questions or concerns prior please give our office a call.

## 2017-06-30 NOTE — Progress Notes (Signed)
Outpatient Surgical Follow Up  06/30/2017  Jeffrey Prince is an 51 y.o. male.   Chief Complaint  Patient presents with  . Follow-up    Anal Fissure    HPI: F/U anal fissure. Continues to have anorectal pain. Pain is intermittent, sharp and moderate. Worsening when having a BM. Some abdominal pain. GI evaluation performed and Dr. Allen Norris feels no further endoscopies are required.  HE states he did have good response after botox but it was transient.  Past Medical History:  Diagnosis Date  . Acne   . Blind left eye    Since birth  . Chorea   . Diverticulosis   . ED (erectile dysfunction)   . GERD (gastroesophageal reflux disease)   . Hemorrhoids   . Weight loss    Abnormal    Past Surgical History:  Procedure Laterality Date  . BOTOX INJECTION N/A 10/09/2016   Procedure: BOTOX INJECTION;  Surgeon: Jules Husbands, MD;  Location: [facility] ORS;  Service: General;  Laterality: N/A;  . COLONOSCOPY     with polyp removal  . EVALUATION UNDER ANESTHESIA WITH ANAL FISSUROTOMY N/A 10/09/2016   Procedure: EXAM UNDER ANESTHESIA WITH ANAL FISSUROTOMY chemical sphincterotomy w BOtox.;  Surgeon: Jules Husbands, MD;  Location: [facility] ORS;  Service: General;  Laterality: N/A;  Lithotomy, make sure botox is in the room  . HEMORRHOID SURGERY    . ROTATOR CUFF REPAIR  1991    Family History  Problem Relation Age of Onset  . Breast cancer Mother   . Huntington's disease Father   . Cancer Paternal Uncle        Colon  . Diabetes Son   . Cancer Maternal Grandfather   . Cancer Paternal Grandmother   . Heart disease Paternal Grandmother   . Huntington's disease Paternal Grandmother     Social History:  reports that he quit smoking about 2 weeks ago. His smoking use included Cigarettes. He has a 10.00 pack-year smoking history. He has never used smokeless tobacco. He reports that he does not drink alcohol or use drugs.  Allergies:  Allergies  Allergen Reactions  . Bee Venom Swelling  . Codeine  Diarrhea and Nausea Only  . Sulfa Antibiotics Nausea And Vomiting    Medications reviewed.    ROS Full ROS performed and is otherwise negative other than what is stated in HPI   BP 118/73   Pulse (!) 51   Temp 98.1 F (36.7 C) (Oral)   Ht 5\' 10"  (1.778 m)   Wt 63.6 kg (140 lb 3.2 oz)   BMI 20.12 kg/m   Physical Exam  Constitutional: He is oriented to person, place, and time and well-developed, well-nourished, and in no distress. No distress.  Neck: No JVD present. No tracheal deviation present.  Cardiovascular: Intact distal pulses.   Pulmonary/Chest: Effort normal. No stridor. No respiratory distress.  Abdominal: Soft. He exhibits no distension. There is no tenderness. There is no rebound and no guarding.  Genitourinary:  Genitourinary Comments: Rectal exam no masses, tender to palpation, no abscess, no hemorrhoids. Small posterior midline fissure  Musculoskeletal: Normal range of motion. He exhibits no edema.  Neurological: He is alert and oriented to person, place, and time. Gait normal. GCS score is 15.  Skin: Skin is warm and dry. He is not diaphoretic.  Psychiatric: Mood, memory, affect and judgment normal.    Assessment/Plan:  1. Anal fissure Patient has already had a Botox injection more than 9 months ago. Discussed with the patient  in detail about repeating the treatment. He stated that here was significant improvement a few weeks after the came back. He wishes to Have another Botox injection.  Patient was explained about the procedure in detail. Risk, benefits and possible complications including but not limited to: Bleeding, infection, recurrence incontinence. He wishes to proceed. E is encouraged to continue nifedipine cream and sitz baths  Caroleen Hamman, MD [facility] General Surgeon

## 2017-07-01 ENCOUNTER — Telehealth: Payer: Self-pay | Admitting: Surgery

## 2017-07-01 DIAGNOSIS — Z820 Family history of epilepsy and other diseases of the nervous system: Secondary | ICD-10-CM | POA: Insufficient documentation

## 2017-07-01 NOTE — Telephone Encounter (Signed)
Pt advised of pre op date/time and sx date. Sx: 07/23/17 with Dr Adolphus Birchwood under anesthesia with fissurotomy with botox injection.  Pre op: 07/15/17 between 9-1:00pm--phone.   Patient made aware to call [phone], between 1-3:00pm the day before surgery, to find out what time to arrive.

## 2017-07-09 ENCOUNTER — Telehealth: Payer: Self-pay

## 2017-07-09 ENCOUNTER — Other Ambulatory Visit: Payer: Self-pay

## 2017-07-09 DIAGNOSIS — K602 Anal fissure, unspecified: Secondary | ICD-10-CM

## 2017-07-09 NOTE — Telephone Encounter (Signed)
Patient called stating that his Nifedpine ointment was not availble for him to pick up at the [facility]. I told him that I had called in the medication the day he was seen in office but that I would give them another call and call him back once I find out why it was not available. Patient verbalized understanding and stated that he would await my phone call.  Spoke with Helene Kelp at [facility] and told her that the patients ointment has not been filled and that I had called it a few days ago. She took an over the phone order for the Nifedipine 0.3% with 1.5% Lidocaine in a petroleum base 30 grams TID for 6 weeks with 1 refill. I verified the order and she stated that they would get it filled as soon as possible.  I called the patient back to let him know that I spoke with [facility] and that they have the order for the Nifedpine ointment and will contact him once it is ready. Patient was thankful and verbalized understanding at this time.

## 2017-07-11 ENCOUNTER — Telehealth: Payer: Self-pay

## 2017-07-11 NOTE — Telephone Encounter (Signed)
-----   Message from Lucilla Lame, MD sent at 06/29/2017  6:04 PM EDT ----- This was supposed to be a small bowel follow through with the upper GI.  The patient know that his esophagus and stomach showed only some reflux but no other abnormalities.  He should be set up for a small bowel follow-through.

## 2017-07-11 NOTE — Telephone Encounter (Signed)
Pt notified of results

## 2017-07-15 ENCOUNTER — Inpatient Hospital Stay: Admission: RE | Admit: 2017-07-15 | Payer: BLUE CROSS/BLUE SHIELD | Source: Ambulatory Visit

## 2017-07-16 ENCOUNTER — Encounter
Admission: RE | Admit: 2017-07-16 | Discharge: 2017-07-16 | Disposition: A | Discharge status: Home / Self Care | Payer: BLUE CROSS/BLUE SHIELD | Source: Ambulatory Visit | Attending: Surgery | Admitting: Surgery

## 2017-07-16 NOTE — Patient Instructions (Signed)
Your procedure is scheduled on: 07-23-17 Report to Same Day Surgery 2nd floor medical mall [facility] Entrance-take elevator on left to 2nd floor.  Check in with surgery information desk.) To find out your arrival time please call [phone] between 1PM - 3PM on  07-22-17  Remember: Instructions that are not followed completely may result in serious medical risk, up to and including death, or upon the discretion of your surgeon and anesthesiologist your surgery may need to be rescheduled.    _x___ 1. Do not eat food after midnight the night before your procedure. You may drink clear liquids up to 2 hours before you are scheduled to arrive at the hospital for your procedure.  Do not drink clear liquids within 2 hours of your scheduled arrival to the hospital.  Clear liquids include  --Water or Apple juice without pulp  --Clear carbohydrate beverage such as ClearFast or Gatorade  --Black Coffee or Clear Tea (No milk, no creamers, do not add anything to                  the coffee or Tea Type 1 and type 2 diabetics should only drink water.  No gum chewing or hard candies.     __x__ 2. No Alcohol for 24 hours before or after surgery.   __x__3. No Smoking for 24 prior to surgery.   ____  4. Bring all medications with you on the day of surgery if instructed.    __x__ 5. Notify your doctor if there is any change in your medical condition     (cold, fever, infections).     Do not wear jewelry, make-up, hairpins, clips or nail polish.  Do not wear lotions, powders, or perfumes. You may wear deodorant.  Do not shave 48 hours prior to surgery. Men may shave face and neck.  Do not bring valuables to the hospital.    [facility] is not responsible for any belongings or valuables.               Contacts, dentures or bridgework may not be worn into surgery.  Leave your suitcase in the car. After surgery it may be brought to your room.  For patients admitted to the hospital, discharge time is  determined by your                       treatment team.   Patients discharged the day of surgery will not be allowed to drive home.  You will need someone to drive you home and stay with you the night of your procedure.    Please read over the following fact sheets that you were given:   [facility] Preparing for Surgery and or MRSA Information   _x___ Take anti-hypertensive listed below, cardiac, seizure, asthma,     anti-reflux and psychiatric medicines. These include:  1. ZANTAC  2.  3.  4.  5.  6.  ____Fleets enema or Magnesium Citrate as directed.   ____ Use CHG Soap or sage wipes as directed on instruction sheet   ____ Use inhalers on the day of surgery and bring to hospital day of surgery  ____ Stop Metformin and Janumet 2 days prior to surgery.    ____ Take 1/2 of usual insulin dose the night before surgery and none on the morning     surgery.   ____ Follow recommendations from Cardiologist, Pulmonologist or PCP regarding  stopping Aspirin, Coumadin, Plavix ,Eliquis, Effient, or Pradaxa, and Pletal.  X____Stop Anti-inflammatories such as Advil, Aleve, Ibuprofen, Motrin, Naproxen, Naprosyn, Goodies powders or aspirin products NOW-OK to take Tylenol    ____ Stop supplements until after surgery.  But may continue Vitamin D, Vitamin B,       and multivitamin.   ____ Bring C-Pap to the hospital.

## 2017-07-23 ENCOUNTER — Ambulatory Visit: Payer: BLUE CROSS/BLUE SHIELD | Admitting: Anesthesiology

## 2017-07-23 ENCOUNTER — Ambulatory Visit
Admission: RE | Admit: 2017-07-23 | Discharge: 2017-07-23 | Disposition: A | Discharge status: Home / Self Care | Payer: BLUE CROSS/BLUE SHIELD | Source: Ambulatory Visit | Attending: Surgery | Admitting: Surgery

## 2017-07-23 ENCOUNTER — Encounter: Payer: Self-pay | Admitting: Anesthesiology

## 2017-07-23 ENCOUNTER — Encounter
Admission: RE | Disposition: A | Discharge status: Home / Self Care | Payer: Self-pay | Source: Ambulatory Visit | Attending: Surgery

## 2017-07-23 DIAGNOSIS — K219 Gastro-esophageal reflux disease without esophagitis: Secondary | ICD-10-CM | POA: Insufficient documentation

## 2017-07-23 DIAGNOSIS — Z87891 Personal history of nicotine dependence: Secondary | ICD-10-CM | POA: Diagnosis not present

## 2017-07-23 DIAGNOSIS — H5462 Unqualified visual loss, left eye, normal vision right eye: Secondary | ICD-10-CM | POA: Diagnosis not present

## 2017-07-23 DIAGNOSIS — Z79899 Other long term (current) drug therapy: Secondary | ICD-10-CM | POA: Insufficient documentation

## 2017-07-23 DIAGNOSIS — K602 Anal fissure, unspecified: Secondary | ICD-10-CM | POA: Diagnosis not present

## 2017-07-23 HISTORY — PX: BOTOX INJECTION: SHX5754

## 2017-07-23 HISTORY — PX: EVALUATION UNDER ANESTHESIA WITH ANAL FISSUROTOMY: SHX5622

## 2017-07-23 SURGERY — EXAM UNDER ANESTHESIA WITH ANAL FISSUROTOMY
Anesthesia: General

## 2017-07-23 MED ORDER — BUPIVACAINE-EPINEPHRINE (PF) 0.25% -1:200000 IJ SOLN
INTRAMUSCULAR | Status: DC | PRN
  Administered 2017-07-23: 30 mL

## 2017-07-23 MED ORDER — PROPOFOL 10 MG/ML IV BOLUS
INTRAVENOUS | Status: AC
  Filled 2017-07-23: qty 20

## 2017-07-23 MED ORDER — KETOROLAC TROMETHAMINE 30 MG/ML IJ SOLN
INTRAMUSCULAR | Status: DC | PRN
  Administered 2017-07-23: 30 mg via INTRAVENOUS

## 2017-07-23 MED ORDER — HYDROCODONE-ACETAMINOPHEN 5-325 MG PO TABS
ORAL_TABLET | ORAL | Status: AC
  Filled 2017-07-23: qty 1

## 2017-07-23 MED ORDER — ACETAMINOPHEN 10 MG/ML IV SOLN
INTRAVENOUS | Status: AC
  Filled 2017-07-23: qty 100

## 2017-07-23 MED ORDER — GLYCOPYRROLATE 0.2 MG/ML IJ SOLN
INTRAMUSCULAR | Status: AC
  Filled 2017-07-23: qty 1

## 2017-07-23 MED ORDER — HYDROCODONE-ACETAMINOPHEN 5-325 MG PO TABS
1.0000 | ORAL_TABLET | Freq: Four times a day (QID) | ORAL | Status: DC | PRN
  Administered 2017-07-23: 1 via ORAL

## 2017-07-23 MED ORDER — DEXAMETHASONE SODIUM PHOSPHATE 10 MG/ML IJ SOLN
INTRAMUSCULAR | Status: AC
  Filled 2017-07-23: qty 1

## 2017-07-23 MED ORDER — LACTATED RINGERS IV SOLN
INTRAVENOUS | Status: DC
  Administered 2017-07-23 (×2): via INTRAVENOUS

## 2017-07-23 MED ORDER — CHLORHEXIDINE GLUCONATE CLOTH 2 % EX PADS: 6.0000 | MEDICATED_PAD | Freq: Once | CUTANEOUS | Status: DC

## 2017-07-23 MED ORDER — MIDAZOLAM HCL 2 MG/2ML IJ SOLN
INTRAMUSCULAR | Status: DC | PRN
  Administered 2017-07-23: 2 mg via INTRAVENOUS

## 2017-07-23 MED ORDER — LIDOCAINE HCL (PF) 2 % IJ SOLN
INTRAMUSCULAR | Status: AC
Start: 2017-07-23 — End: 2017-07-23
  Filled 2017-07-23: qty 4

## 2017-07-23 MED ORDER — FENTANYL CITRATE (PF) 100 MCG/2ML IJ SOLN
INTRAMUSCULAR | Status: AC
  Filled 2017-07-23: qty 2

## 2017-07-23 MED ORDER — ACETAMINOPHEN 10 MG/ML IV SOLN
INTRAVENOUS | Status: DC | PRN
  Administered 2017-07-23: 1000 mg via INTRAVENOUS

## 2017-07-23 MED ORDER — PROPOFOL 10 MG/ML IV BOLUS
INTRAVENOUS | Status: DC | PRN
  Administered 2017-07-23: 120 mg via INTRAVENOUS

## 2017-07-23 MED ORDER — HYDROCODONE-ACETAMINOPHEN 5-325 MG PO TABS: 1.0000 | ORAL_TABLET | Freq: Four times a day (QID) | ORAL | 0 refills | Status: DC | PRN

## 2017-07-23 MED ORDER — BUPIVACAINE-EPINEPHRINE (PF) 0.25% -1:200000 IJ SOLN
INTRAMUSCULAR | Status: AC
  Filled 2017-07-23: qty 30

## 2017-07-23 MED ORDER — ONDANSETRON HCL 4 MG/2ML IJ SOLN: 4.0000 mg | Freq: Once | INTRAMUSCULAR | Status: DC | PRN

## 2017-07-23 MED ORDER — FENTANYL CITRATE (PF) 100 MCG/2ML IJ SOLN
INTRAMUSCULAR | Status: DC | PRN
  Administered 2017-07-23: 50 ug via INTRAVENOUS

## 2017-07-23 MED ORDER — ONDANSETRON HCL 4 MG/2ML IJ SOLN
INTRAMUSCULAR | Status: DC | PRN
  Administered 2017-07-23: 4 mg via INTRAVENOUS

## 2017-07-23 MED ORDER — LIDOCAINE HCL (CARDIAC) 20 MG/ML IV SOLN
INTRAVENOUS | Status: DC | PRN
  Administered 2017-07-23: 60 mg via INTRAVENOUS

## 2017-07-23 MED ORDER — ONABOTULINUMTOXINA 100 UNITS IJ SOLR
INTRAMUSCULAR | Status: DC | PRN
  Administered 2017-07-23: 50 [IU] via INTRAMUSCULAR

## 2017-07-23 MED ORDER — KETOROLAC TROMETHAMINE 30 MG/ML IJ SOLN
INTRAMUSCULAR | Status: AC
Start: 2017-07-23 — End: 2017-07-23
  Filled 2017-07-23: qty 1

## 2017-07-23 MED ORDER — MIDAZOLAM HCL 2 MG/2ML IJ SOLN
INTRAMUSCULAR | Status: AC
Start: 2017-07-23 — End: 2017-07-23
  Filled 2017-07-23: qty 2

## 2017-07-23 MED ORDER — DEXAMETHASONE SODIUM PHOSPHATE 10 MG/ML IJ SOLN
INTRAMUSCULAR | Status: DC | PRN
  Administered 2017-07-23: 10 mg via INTRAVENOUS

## 2017-07-23 MED ORDER — FENTANYL CITRATE (PF) 100 MCG/2ML IJ SOLN: 25.0000 ug | INTRAMUSCULAR | Status: DC | PRN

## 2017-07-23 MED ORDER — GLYCOPYRROLATE 0.2 MG/ML IJ SOLN
INTRAMUSCULAR | Status: DC | PRN
  Administered 2017-07-23: 0.2 mg via INTRAVENOUS

## 2017-07-23 MED ORDER — ONDANSETRON HCL 4 MG/2ML IJ SOLN
INTRAMUSCULAR | Status: AC
Start: 2017-07-23 — End: 2017-07-23
  Filled 2017-07-23: qty 2

## 2017-07-23 SURGICAL SUPPLY — 13 items
CANISTER SUCT 1200ML W/VALVE (MISCELLANEOUS) ×3 IMPLANT
ELECT REM PT RETURN 9FT ADLT (ELECTROSURGICAL) ×3
ELECTRODE REM PT RTRN 9FT ADLT (ELECTROSURGICAL) ×1 IMPLANT
GLOVE BIO SURGEON STRL SZ7 (GLOVE) ×3 IMPLANT
GOWN STRL REUS W/ TWL LRG LVL3 (GOWN DISPOSABLE) ×2 IMPLANT
GOWN STRL REUS W/TWL LRG LVL3 (GOWN DISPOSABLE) ×4
JELLY LUB 2OZ STRL (MISCELLANEOUS) ×2
JELLY LUBE 2OZ STRL (MISCELLANEOUS) ×1 IMPLANT
NDL SAFETY 22GX1.5 (NEEDLE) ×3 IMPLANT
PACK BASIN MINOR ARMC (MISCELLANEOUS) ×3 IMPLANT
SOL PREP PVP 2OZ (MISCELLANEOUS) ×3
SOLUTION PREP PVP 2OZ (MISCELLANEOUS) ×1 IMPLANT
SPONGE LAP 18X18 5 PK (GAUZE/BANDAGES/DRESSINGS) ×3 IMPLANT

## 2017-07-23 NOTE — H&P (View-Only) (Signed)
Outpatient Surgical Follow Up  06/30/2017  Jeffrey Prince is an 51 y.o. male.   Chief Complaint  Patient presents with  . Follow-up    Anal Fissure    HPI: F/U anal fissure. Continues to have anorectal pain. Pain is intermittent, sharp and moderate. Worsening when having a BM. Some abdominal pain. GI evaluation performed and Dr. Allen Norris feels no further endoscopies are required.  HE states he did have good response after botox but it was transient.  Past Medical History:  Diagnosis Date  . Acne   . Blind left eye    Since birth  . Chorea   . Diverticulosis   . ED (erectile dysfunction)   . GERD (gastroesophageal reflux disease)   . Hemorrhoids   . Weight loss    Abnormal    Past Surgical History:  Procedure Laterality Date  . BOTOX INJECTION N/A 10/09/2016   Procedure: BOTOX INJECTION;  Surgeon: Jules Husbands, MD;  Location: [facility] ORS;  Service: General;  Laterality: N/A;  . COLONOSCOPY     with polyp removal  . EVALUATION UNDER ANESTHESIA WITH ANAL FISSUROTOMY N/A 10/09/2016   Procedure: EXAM UNDER ANESTHESIA WITH ANAL FISSUROTOMY chemical sphincterotomy w BOtox.;  Surgeon: Jules Husbands, MD;  Location: [facility] ORS;  Service: General;  Laterality: N/A;  Lithotomy, make sure botox is in the room  . HEMORRHOID SURGERY    . ROTATOR CUFF REPAIR  1991    Family History  Problem Relation Age of Onset  . Breast cancer Mother   . Huntington's disease Father   . Cancer Paternal Uncle        Colon  . Diabetes Son   . Cancer Maternal Grandfather   . Cancer Paternal Grandmother   . Heart disease Paternal Grandmother   . Huntington's disease Paternal Grandmother     Social History:  reports that he quit smoking about 2 weeks ago. His smoking use included Cigarettes. He has a 10.00 pack-year smoking history. He has never used smokeless tobacco. He reports that he does not drink alcohol or use drugs.  Allergies:  Allergies  Allergen Reactions  . Bee Venom Swelling  . Codeine  Diarrhea and Nausea Only  . Sulfa Antibiotics Nausea And Vomiting    Medications reviewed.    ROS Full ROS performed and is otherwise negative other than what is stated in HPI   BP 118/73   Pulse (!) 51   Temp 98.1 F (36.7 C) (Oral)   Ht 5\' 10"  (1.778 m)   Wt 63.6 kg (140 lb 3.2 oz)   BMI 20.12 kg/m   Physical Exam  Constitutional: He is oriented to person, place, and time and well-developed, well-nourished, and in no distress. No distress.  Neck: No JVD present. No tracheal deviation present.  Cardiovascular: Intact distal pulses.   Pulmonary/Chest: Effort normal. No stridor. No respiratory distress.  Abdominal: Soft. He exhibits no distension. There is no tenderness. There is no rebound and no guarding.  Genitourinary:  Genitourinary Comments: Rectal exam no masses, tender to palpation, no abscess, no hemorrhoids. Small posterior midline fissure  Musculoskeletal: Normal range of motion. He exhibits no edema.  Neurological: He is alert and oriented to person, place, and time. Gait normal. GCS score is 15.  Skin: Skin is warm and dry. He is not diaphoretic.  Psychiatric: Mood, memory, affect and judgment normal.    Assessment/Plan:  1. Anal fissure Patient has already had a Botox injection more than 9 months ago. Discussed with the patient  in detail about repeating the treatment. He stated that here was significant improvement a few weeks after the came back. He wishes to Have another Botox injection.  Patient was explained about the procedure in detail. Risk, benefits and possible complications including but not limited to: Bleeding, infection, recurrence incontinence. He wishes to proceed. E is encouraged to continue nifedipine cream and sitz baths  Caroleen Hamman, MD [facility] General Surgeon

## 2017-07-23 NOTE — Anesthesia Preprocedure Evaluation (Signed)
Anesthesia Evaluation  Patient identified by MRN, date of birth, ID band Patient awake    Reviewed: Allergy & Precautions, NPO status , Patient's Chart, lab work & pertinent test results, reviewed documented beta blocker date and time   Airway Mallampati: III  TM Distance: >3 FB     Dental  (+) Chipped   Pulmonary former smoker,           Cardiovascular      Neuro/Psych    GI/Hepatic GERD  Controlled,  Endo/Other    Renal/GU      Musculoskeletal   Abdominal   Peds  Hematology   Anesthesia Other Findings Blind L eye.  Reproductive/Obstetrics                             Anesthesia Physical Anesthesia Plan  ASA: III  Anesthesia Plan: General   Post-op Pain Management:    Induction: Intravenous  PONV Risk Score and Plan:   Airway Management Planned: Oral ETT and LMA  Additional Equipment:   Intra-op Plan:   Post-operative Plan:   Informed Consent: I have reviewed the patients History and Physical, chart, labs and discussed the procedure including the risks, benefits and alternatives for the proposed anesthesia with the patient or authorized representative who has indicated his/her understanding and acceptance.     Plan Discussed with: CRNA  Anesthesia Plan Comments:         Anesthesia Quick Evaluation

## 2017-07-23 NOTE — Anesthesia Procedure Notes (Signed)
Procedure Name: LMA Insertion Date/Time: 07/23/2017 12:48 PM Performed by: Silvana Newness Pre-anesthesia Checklist: Patient identified, Emergency Drugs available, Suction available, Patient being monitored and Timeout performed Patient Re-evaluated:Patient Re-evaluated prior to induction Oxygen Delivery Method: Circle system utilized Preoxygenation: Pre-oxygenation with 100% oxygen Induction Type: IV induction Ventilation: Mask ventilation without difficulty LMA: LMA inserted LMA Size: 4.0 Number of attempts: 1 Placement Confirmation: positive ETCO2 and breath sounds checked- equal and bilateral Tube secured with: Tape Dental Injury: Teeth and Oropharynx as per pre-operative assessment

## 2017-07-23 NOTE — Anesthesia Postprocedure Evaluation (Signed)
Anesthesia Post Note  Patient: Jeffrey Prince  Procedure(s) Performed: Procedure(s) (LRB): EXAM UNDER ANESTHESIA WITH ANAL FISSUROTOMY, botox injection (N/A) BOTOX INJECTION (N/A)  Patient location during evaluation: PACU Anesthesia Type: General Level of consciousness: awake and alert Pain management: pain level controlled Vital Signs Assessment: post-procedure vital signs reviewed and stable Respiratory status: spontaneous breathing, nonlabored ventilation, respiratory function stable and patient connected to nasal cannula oxygen Cardiovascular status: blood pressure returned to baseline and stable Postop Assessment: no apparent nausea or vomiting Anesthetic complications: no     Last Vitals:  Vitals:   07/23/17 1402 07/23/17 1421  BP: 123/78 136/77  Pulse: (!) 46 (!) 48  Resp: 18 16  Temp:  (!) 35.7 C  SpO2: 100% 100%    Last Pain:  Vitals:   07/23/17 1421  TempSrc: Temporal  PainSc: [name]

## 2017-07-23 NOTE — Anesthesia Post-op Follow-up Note (Signed)
Anesthesia QCDR form completed.        

## 2017-07-23 NOTE — Discharge Instructions (Signed)

## 2017-07-23 NOTE — Transfer of Care (Signed)
Immediate Anesthesia Transfer of Care Note  Patient: Jeffrey Prince  Procedure(s) Performed: Procedure(s): EXAM UNDER ANESTHESIA WITH ANAL FISSUROTOMY, botox injection (N/A) BOTOX INJECTION (N/A)  Patient Location: PACU  Anesthesia Type:General  Level of Consciousness: drowsy and patient cooperative  Airway & Oxygen Therapy: Patient Spontanous Breathing and Patient connected to face mask oxygen  Post-op Assessment: Report given to RN and Post -op Vital signs reviewed and stable  Post vital signs: Reviewed and stable  Last Vitals:  Vitals:   07/23/17 1119  BP: 119/78  Pulse: (!) 56  Resp: 18  Temp: 37.2 C  SpO2: 100%    Last Pain:  Vitals:   07/23/17 1119  TempSrc: Oral  PainSc: 8          Complications: No apparent anesthesia complications

## 2017-07-23 NOTE — Op Note (Signed)
  07/23/2017  1:21 PM  PATIENT:  Jeffrey Prince  50 y.o. male  PRE-OPERATIVE DIAGNOSIS:  Anal fissure  POST-OPERATIVE DIAGNOSIS:  Same  PROCEDURE:   Chemical sphincterotomy w Botox   SURGEON:  Surgeon(s) and Role:    * Pabon, Diego F, MD - Primary   ANESTHESIA: GETA  INDICATIONS FOR PROCEDURE Fissure  DICTATION:  Patient was playing about proceeding detail, risk benefits possible complications and a consent was obtained. The patient taken to the operating room and placed in a modified lithotomy position.   Examination revealed a small post midline fissure, no hemorrhoids, no rectal masses.  We perform a chemical sphincterotomy injecting 50 international units of Botox into the sphincteric muscle complex. No bleeding was encountered.  Marcaine quarter percent with epinephrine was injected around the anorectal area. Needle and laparotomy counts were correct and there were no immediate complications  Jules Husbands, MD

## 2017-07-23 NOTE — Interval H&P Note (Signed)
History and Physical Interval Note:  07/23/2017 11:52 AM  Jeffrey Prince  has presented today for surgery, with the diagnosis of fissure  The various methods of treatment have been discussed with the patient and family. After consideration of risks, benefits and other options for treatment, the patient has consented to  Procedure(s): EXAM UNDER ANESTHESIA WITH ANAL FISSUROTOMY, botox injection (N/A) BOTOX INJECTION (N/A) as a surgical intervention .  The patient's history has been reviewed, patient examined, no change in status, stable for surgery.  I have reviewed the patient's chart and labs.  Questions were answered to the patient's satisfaction.     [place]

## 2017-07-25 ENCOUNTER — Telehealth: Payer: Self-pay

## 2017-07-25 NOTE — Telephone Encounter (Signed)
Post-op call made to patient at this time. Spoke with Darlyne Russian. Post-op interview questions below.  1. How are you feeling? Patient states that he is feeling okay. Having some pain but tolerating it well.  2. Is your pain controlled? Yes  3. What are you doing for the pain?  Taking hydrocodone as needed for pain. Advised him to try taking some tylenol and ibuprofen alternating every 3 hours as needed for pain since the pain medication can cause some constipation.  4. Are you having any Nausea or Vomiting? None  5. Are you having any Fever or Chills? None  6. Are you having any Constipation or Diarrhea? Was having trouble using the restroom due to the pain medication. Took some stool softeners and that help with him having a bowel movement.  7. Is there any Swelling or Bruising you are concerned about? None  8. Do you have any questions or concerns at this time? Asked if ice pack would help with soothing pain. Told him that would be fine and that sitz baths would help as well.   Discussion: Reminded patient of post op appointment on 10/3 at 9:30, and to call if he had any questions or concerns prior to this visit.

## 2017-08-06 ENCOUNTER — Encounter: Payer: Self-pay | Admitting: Surgery

## 2017-08-06 ENCOUNTER — Ambulatory Visit (INDEPENDENT_AMBULATORY_CARE_PROVIDER_SITE_OTHER): Payer: BLUE CROSS/BLUE SHIELD | Admitting: Surgery

## 2017-08-06 VITALS — BP 130/66 | HR 54 | Temp 98.1°F | Ht 70.0 in | Wt 140.0 lb

## 2017-08-06 DIAGNOSIS — Z09 Encounter for follow-up examination after completed treatment for conditions other than malignant neoplasm: Secondary | ICD-10-CM | POA: Diagnosis not present

## 2017-08-06 MED ORDER — HYDROCORTISONE 2.5 % RE CREA: 1.0000 "application " | TOPICAL_CREAM | Freq: Three times a day (TID) | RECTAL | 2 refills | Status: DC

## 2017-08-06 NOTE — Progress Notes (Signed)
Outpatient Surgical Follow Up  08/06/2017  Jeffrey Prince is an 51 y.o. male.   Chief Complaint  Patient presents with  . Routine Post Op    Chemical sphincterotomy w Botox    HPI: S/p Botox injection, feeling better but has still some intermittent pain worsening w BM. Lidocaine seems to help the most. No fevers or chils ,no bleeding  Past Medical History:  Diagnosis Date  . Acne   . Blind left eye    Since birth  . Chorea   . Diverticulosis   . ED (erectile dysfunction)   . GERD (gastroesophageal reflux disease)   . Hemorrhoids   . Weight loss    Abnormal    Past Surgical History:  Procedure Laterality Date  . BOTOX INJECTION N/A 10/09/2016   Procedure: BOTOX INJECTION;  Surgeon: Jules Husbands, MD;  Location: [facility] ORS;  Service: General;  Laterality: N/A;  . BOTOX INJECTION N/A 07/23/2017   Procedure: BOTOX INJECTION;  Surgeon: Jules Husbands, MD;  Location: [facility] OR[facility];  Service: General;  Laterality: N/A;  . COLONOSCOPY     with polyp removal  . EVALUATION UNDER ANESTHESIA WITH ANAL FISSUROTOMY N/A 10/09/2016   Procedure: EXAM UNDER ANESTHESIA WITH ANAL FISSUROTOMY chemical sphincterotomy w BOtox.;  Surgeon: Jules Husbands, MD;  Location: [facility] ORS;  Service: General;  Laterality: N/A;  Lithotomy, make sure botox is in the room  . EVALUATION UNDER ANESTHESIA WITH ANAL FISSUROTOMY N/A 07/23/2017   Procedure: EXAM UNDER ANESTHESIA WITH ANAL FISSUROTOMY, botox injection;  Surgeon: Jules Husbands, MD;  Location: [facility] ORS;  Service: General;  Laterality: N/A;  . HEMORRHOID SURGERY    . ROTATOR CUFF REPAIR  1991    Family History  Problem Relation Age of Onset  . Breast cancer Mother   . Huntington's disease Father   . Cancer Paternal Uncle        Colon  . Diabetes Son   . Cancer Maternal Grandfather   . Cancer Paternal Grandmother   . Heart disease Paternal Grandmother   . Huntington's disease Paternal Grandmother     Social History:  reports that he quit smoking  about 7 weeks ago. His smoking use included Cigarettes. He has a 10.00 pack-year smoking history. He has never used smokeless tobacco. He reports that he does not drink alcohol or use drugs.  Allergies:  Allergies  Allergen Reactions  . Bee Venom Swelling  . Codeine Diarrhea and Nausea Only  . Sulfa Antibiotics Nausea And Vomiting    Medications reviewed.    ROS Full ROS performed and is otherwise negative other than what is stated in HPI   BP 130/66   Pulse (!) 54   Temp 98.1 F (36.7 C) (Oral)   Ht 5\' 10"  (1.778 m)   Wt 63.5 kg (140 lb)   BMI 20.09 kg/m   Physical Exam NAD Rectal : tiny post fissure, improving as comparedto previous EUA. No perianal sepsis   Assessment/Plan: Doing well Continue nifedipine cream and lidocaine gel, sitz baths May add short course annusol RTC 3 weeks  Caroleen Hamman, MD [facility] General Surgeon

## 2017-08-06 NOTE — Patient Instructions (Signed)
We have sent over a new cream for you to try with a few refills.  Please use as prescribed.   We will follow up with you as listed below.

## 2017-09-08 ENCOUNTER — Ambulatory Visit: Payer: BLUE CROSS/BLUE SHIELD | Admitting: Surgery

## 2017-09-11 ENCOUNTER — Ambulatory Visit: Payer: BLUE CROSS/BLUE SHIELD | Admitting: Surgery

## 2017-09-15 ENCOUNTER — Ambulatory Visit: Payer: BLUE CROSS/BLUE SHIELD | Admitting: Surgery

## 2017-09-24 ENCOUNTER — Encounter: Payer: Self-pay | Admitting: Surgery

## 2017-09-24 ENCOUNTER — Ambulatory Visit (INDEPENDENT_AMBULATORY_CARE_PROVIDER_SITE_OTHER): Payer: BLUE CROSS/BLUE SHIELD | Admitting: Surgery

## 2017-09-24 VITALS — BP 106/58 | HR 51 | Temp 98.2°F | Ht 70.0 in | Wt 145.2 lb

## 2017-09-24 DIAGNOSIS — K610 Anal abscess: Secondary | ICD-10-CM | POA: Diagnosis not present

## 2017-09-24 MED ORDER — AMOXICILLIN-POT CLAVULANATE 875-125 MG PO TABS: 1.0000 | ORAL_TABLET | Freq: Two times a day (BID) | ORAL | 0 refills | Status: DC

## 2017-09-24 NOTE — Patient Instructions (Signed)
Please see your follow up appointment listed below.   Today we have drained your Abscess in the office. The numbing medication will wear off in approximately 4-8 hours. You will have some pain to the area afterwards but should not be as severe as prior to the procedure.  If you have been given antibiotics, please continue to take them after your procedure.    We will see you back as scheduled below.   If you have any questions or concerns prior to your appointment, please call our office and speak with a nurse.  Incision and Drainage Incision and drainage is a surgical procedure to open and drain a fluid-filled sac. The sac may be filled with pus, mucus, or blood. Examples of fluid-filled sacs that may need surgical drainage include cysts, skin infections (abscesses), and red lumps that develop from a ruptured cyst or a small abscess (boils). You may need this procedure if the affected area is large, painful, infected, or not healing well. Tell a health care provider about:  Any allergies you have.  All medicines you are taking, including vitamins, herbs, eye drops, creams, and over-the-counter medicines.  Any problems you or family members have had with anesthetic medicines.  Any blood disorders you have.  Any surgeries you have had.  Any medical conditions you have.  Whether you are pregnant or may be pregnant. What are the risks? Generally, this is a safe procedure. However, problems may occur, including:  Infection.  Bleeding.  Allergic reactions to medicines.  Scarring.  What happens before the procedure?  You may need an ultrasound or other imaging tests to see how large or deep the fluid-filled sac is.  You may have blood tests to check for infection.  You may get a tetanus shot.  You may be given antibiotic medicine to help prevent infection.  Follow instructions from your health care provider about eating or drinking restrictions.  Ask your health care  provider about: ? Changing or stopping your regular medicines. This is especially important if you are taking diabetes medicines or blood thinners. ? Taking medicines such as aspirin and ibuprofen. These medicines can thin your blood. Do not take these medicines before your procedure if your health care provider instructs you not to.  Plan to have someone take you home after the procedure.  If you will be going home right after the procedure, plan to have someone stay with you for 24 hours. What happens during the procedure?  To reduce your risk of infection: ? Your health care team will wash or sanitize their hands. ? Your skin will be washed with soap.  You will be given one or more of the following: ? A medicine to help you relax (sedative). ? A medicine to numb the area (local anesthetic). ? A medicine to make you fall asleep (general anesthetic).  An incision will be made in the top of the fluid-filled sac.  The contents of the sac may be squeezed out, or a syringe or tube (catheter)may be used to empty the sac.  The catheter may be left in place for several weeks to drain any fluid. Or, your health care provider may stitch open the edges of the incision to make a long-term opening for drainage (marsupialization).  The inside of the sac may be washed out (irrigated) with a sterile solution and packed with gauze before it is covered with a bandage (dressing). The procedure may vary among health care providers and hospitals. What happens after the  procedure?  Your blood pressure, heart rate, breathing rate, and blood oxygen level will be monitored often until the medicines you were given have worn off.  Do not drive for 24 hours if you received a sedative. This information is not intended to replace advice given to you by your health care provider. Make sure you discuss any questions you have with your health care provider. Document Released: 04/16/2001 Document Revised:  03/28/2016 Document Reviewed: 08/11/2015 Elsevier Interactive Patient Education  2017 [place].   Incision and Drainage, Care After Refer to this sheet in the next few weeks. These instructions provide you with information about caring for yourself after your procedure. Your health care provider may also give you more specific instructions. Your treatment has been planned according to current medical practices, but problems sometimes occur. Call your health care provider if you have any problems or questions after your procedure. What can I expect after the procedure? After the procedure, it is common to have:  Pain or discomfort around your incision site.  Drainage from your incision.  Follow these instructions at home:  Take over-the-counter and prescription medicines only as told by your health care provider.  If you were prescribed an antibiotic medicine, take it as told by your health care provider.Do not stop taking the antibiotic even if you start to feel better.  Followinstructions from your health care provider about: ? How to take care of your incision. ? When and how you should change your packing and bandage (dressing). Wash your hands with soap and water before you change your dressing. If soap and water are not available, use hand sanitizer. ? When you should remove your dressing.  Do not take baths, swim, or use a hot tub until your health care provider approves.  Keep all follow-up visits as told by your health care provider. This is important.  Check your incision area every day for signs of infection. Check for: ? More redness, swelling, or pain. ? More fluid or blood. ? Warmth. ? Pus or a bad smell. Contact a health care provider if:  Your cyst or abscess returns.  You have a fever.  You have more redness, swelling, or pain around your incision.  You have more fluid or blood coming from your incision.  Your incision feels warm to the touch.  You  have pus or a bad smell coming from your incision. Get help right away if:  You have severe pain or bleeding.  You cannot eat or drink without vomiting.  You have decreased urine output.  You become short of breath.  You have chest pain.  You cough up blood.  The area where the incision and drainage occurred becomes numb or it tingles. This information is not intended to replace advice given to you by your health care provider. Make sure you discuss any questions you have with your health care provider. Document Released: 01/13/2012 Document Revised: 03/22/2016 Document Reviewed: 08/11/2015 Elsevier Interactive Patient Education  2017 [employer].

## 2017-09-24 NOTE — Progress Notes (Signed)
09/24/2017  History of Present Illness: Jeffrey Prince is a 51 y.o. male status post chemical sphincterotomy with Botox injection with Dr. Adora Fridge on 9/19.  He presents for further follow-up.  He reports that he still having pain particularly during bowel movements.  He reports that he intermittently is constipated and takes stool softeners as needed.  He denies any nausea or vomiting.  Reports some mild abdominal discomfort but is vague with his symptoms.  He denies any blood in the stool and denies any drainage.  He reports that the pain has been persistent since the procedure was done.  He had the same procedure done about a year ago and he reports that he was also slow to improve afterwards.  He presents today for further follow-up given his symptoms.  He had been using nifedipine ointment but this he does not feel this works as well and has instead used hydrocortisone ointment as well as lidocaine ointment.  Past Medical History: Past Medical History:  Diagnosis Date  . Acne   . Blind left eye    Since birth  . Chorea   . Diverticulosis   . ED (erectile dysfunction)   . GERD (gastroesophageal reflux disease)   . Hemorrhoids   . Weight loss    Abnormal     Past Surgical History: Past Surgical History:  Procedure Laterality Date  . BOTOX INJECTION N/A 10/09/2016   Procedure: BOTOX INJECTION;  Surgeon: Jules Husbands, MD;  Location: [facility] ORS;  Service: General;  Laterality: N/A;  . BOTOX INJECTION N/A 07/23/2017   Procedure: BOTOX INJECTION;  Surgeon: Jules Husbands, MD;  Location: [facility] OR[facility];  Service: General;  Laterality: N/A;  . COLONOSCOPY     with polyp removal  . EVALUATION UNDER ANESTHESIA WITH ANAL FISSUROTOMY N/A 10/09/2016   Procedure: EXAM UNDER ANESTHESIA WITH ANAL FISSUROTOMY chemical sphincterotomy w BOtox.;  Surgeon: Jules Husbands, MD;  Location: [facility] ORS;  Service: General;  Laterality: N/A;  Lithotomy, make sure botox is in the room  . EVALUATION UNDER ANESTHESIA  WITH ANAL FISSUROTOMY N/A 07/23/2017   Procedure: EXAM UNDER ANESTHESIA WITH ANAL FISSUROTOMY, botox injection;  Surgeon: Jules Husbands, MD;  Location: [facility] OR[facility];  Service: General;  Laterality: N/A;  . HEMORRHOID SURGERY    . ROTATOR CUFF REPAIR  1991    Home Medications: Prior to Admission medications   Medication Sig Start Date End Date Taking? Authorizing Provider  dicyclomine (BENTYL) 20 MG tablet Take 1 tablet (20 mg total) by mouth 3 (three) times daily before meals. 06/23/17  Yes Lucilla Lame, MD  diltiazem 2 % GEL Apply 1 application topically 3 (three) times daily. Apply gel topically three times a day on your rectal area. 04/21/17  Yes Pabon, Diego F, MD  HYDROcodone-acetaminophen (NORCO/VICODIN) 5-325 MG tablet Take 1-2 tablets by mouth every 6 (six) hours as needed for moderate pain. 07/23/17  Yes Pabon, Diego F, MD  hydrocortisone (ANUSOL-HC) 2.5 % rectal cream Place 1 application rectally 3 (three) times daily. 08/06/17  Yes Pabon, Diego F, MD  hyoscyamine (ANASPAZ) 0.125 MG TBDP disintergrating tablet Place 0.125 mg under the tongue every 4 (four) hours as needed for cramping.    Yes [provider]  Lidocaine HCl (LIDOCIN) 3 % GEL Apply 1 application topically 3 (three) times daily as needed (for pain.).   Yes [provider]  Nitroglycerin 0.4 % OINT Place 1 application rectally 2 (two) times daily. 06/30/17  Yes Pabon, Marjory Lies, MD  ranitidine Keturah Shavers)  150 MG tablet Take 1 tablet (150 mg total) by mouth 2 (two) times daily. 12/14/15  Yes Funches, Josalyn, MD  tadalafil (CIALIS) 10 MG tablet Take 10 mg by mouth daily as needed for erectile dysfunction.   Yes [provider]  amoxicillin-clavulanate (AUGMENTIN) 875-125 MG tablet Take 1 tablet by mouth 2 (two) times daily. 09/24/17   Olean Ree, MD    Allergies: Allergies  Allergen Reactions  . Bee Venom Swelling  . Codeine Diarrhea and Nausea Only  . Sulfa Antibiotics Nausea And Vomiting    Social  History:  reports that he quit smoking about 3 months ago. His smoking use included cigarettes. He has a 10.00 pack-year smoking history. he has never used smokeless tobacco. He reports that he does not drink alcohol or use drugs.   Family History: Family History  Problem Relation Age of Onset  . Breast cancer Mother   . Huntington's disease Father   . Cancer Paternal Uncle        Colon  . Diabetes Son   . Cancer Maternal Grandfather   . Cancer Paternal Grandmother   . Heart disease Paternal Grandmother   . Huntington's disease Paternal Grandmother     Review of Systems: Review of Systems  Constitutional: Negative for chills and fever.  HENT: Negative for hearing loss.   Eyes: Negative for blurred vision.  Respiratory: Negative for shortness of breath.   Cardiovascular: Negative for chest pain.  Gastrointestinal: Positive for constipation. Negative for abdominal pain, blood in stool, diarrhea, nausea and vomiting.  Genitourinary: Negative for dysuria.  Musculoskeletal: Negative for myalgias.  Skin: Negative for rash.  Neurological: Negative for dizziness.  Psychiatric/Behavioral: Negative for depression.  All other systems reviewed and are negative.   Physical Exam BP (!) 106/58   Pulse (!) 51   Temp 98.2 F (36.8 C) (Oral)   Ht 5\' 10"  (1.778 m)   Wt 65.9 kg (145 lb 3.2 oz)   BMI 20.83 kg/m  CONSTITUTIONAL: No acute distress HEENT:  Normocephalic, atraumatic, extraocular motion intact. NECK: Trachea is midline, and there is no jugular venous distension.  RESPIRATORY:  Lungs are clear, and breath sounds are equal bilaterally. Normal respiratory effort without pathologic use of accessory muscles. CARDIOVASCULAR: Heart is regular without murmurs, gallops, or rubs. GI: The abdomen is soft, nondistended, nontender.  RECTAL: On external exam, there is a 1.5 cm abscess in the right perianal area with fluctuance and no active drainage.  This area is tender to  palpation. MUSCULOSKELETAL:  Normal muscle strength and tone in all four extremities.  No peripheral edema or cyanosis. SKIN: Skin turgor is normal. There are no pathologic skin lesions.  NEUROLOGIC:  Motor and sensation is grossly normal.  Cranial nerves are grossly intact. PSYCH:  Alert and oriented to person, place and time. Affect is normal.  Labs/Imaging: None recently  Assessment and Plan: This is a 51 y.o. male who presents status post chemical sphincterotomy with Botox injection, now however with a small perianal abscess.  Discussed with the patient that we would proceed with incision and drainage of this abscess cavity.  Discussed the risk of bleeding, infection, injury to surrounding structures with the patient and he is willing to proceed.  At the right perianal area was prepped and draped.  1% lidocaine was infused in the subcutaneous tissue over the abscess.  The abscess was then incised using a cruciate incision and drained.  It was not deep and all the loculations were divided using forceps.  The  area was irrigated and then dressed with a dry gauze dressing.  Patient tolerated the procedure well and all counts were correct.  All sharps were appropriately disposed off.  Patient will follow-up next week with Dr. Dahlia Byes.  Instructed to do sitz bath twice daily and continue with his ointments.  He will be given a prescription for Augmentin for 7 days.  Face-to-face time spent with the patient and care providers was 40 minutes, with more than 50% of the time spent counseling, educating, and coordinating care of the patient.     Melvyn Neth, [place]

## 2017-10-02 ENCOUNTER — Ambulatory Visit (INDEPENDENT_AMBULATORY_CARE_PROVIDER_SITE_OTHER): Payer: BLUE CROSS/BLUE SHIELD | Admitting: Surgery

## 2017-10-02 ENCOUNTER — Encounter: Payer: Self-pay | Admitting: Surgery

## 2017-10-02 ENCOUNTER — Encounter: Payer: BLUE CROSS/BLUE SHIELD | Admitting: Surgery

## 2017-10-02 VITALS — BP 109/58 | HR 58 | Temp 97.9°F | Wt 148.0 lb

## 2017-10-02 DIAGNOSIS — K610 Anal abscess: Secondary | ICD-10-CM

## 2017-10-02 DIAGNOSIS — R197 Diarrhea, unspecified: Secondary | ICD-10-CM

## 2017-10-02 NOTE — Progress Notes (Signed)
Outpatient Surgical Follow Up  10/02/2017  Jeffrey Prince is an 51 y.o. male.   CC: Perirectal abscess  HPI: This patient underwent the I&D of a perirectal abscess on 21 November 7 days ago.  Past Medical History:  Diagnosis Date  . Acne   . Blind left eye    Since birth  . Chorea   . Diverticulosis   . ED (erectile dysfunction)   . GERD (gastroesophageal reflux disease)   . Hemorrhoids   . Weight loss    Abnormal    Past Surgical History:  Procedure Laterality Date  . BOTOX INJECTION N/A 10/09/2016   Procedure: BOTOX INJECTION;  Surgeon: Jules Husbands, MD;  Location: [facility];  Service: General;  Laterality: N/A;  . BOTOX INJECTION N/A 07/23/2017   Procedure: BOTOX INJECTION;  Surgeon: Jules Husbands, MD;  Location: [facility];  Service: General;  Laterality: N/A;  . COLONOSCOPY     with polyp removal  . EVALUATION UNDER ANESTHESIA WITH ANAL FISSUROTOMY N/A 10/09/2016   Procedure: EXAM UNDER ANESTHESIA WITH ANAL FISSUROTOMY chemical sphincterotomy w BOtox.;  Surgeon: Jules Husbands, MD;  Location: [facility];  Service: General;  Laterality: N/A;  Lithotomy, make sure botox is in the room  . EVALUATION UNDER ANESTHESIA WITH ANAL FISSUROTOMY N/A 07/23/2017   Procedure: EXAM UNDER ANESTHESIA WITH ANAL FISSUROTOMY, botox injection;  Surgeon: Jules Husbands, MD;  Location: [facility];  Service: General;  Laterality: N/A;  . HEMORRHOID SURGERY    . ROTATOR CUFF REPAIR  1991    Family History  Problem Relation Age of Onset  . Breast cancer Mother   . Huntington's disease Father   . Cancer Paternal Uncle        Colon  . Diabetes Son   . Cancer Maternal Grandfather   . Cancer Paternal Grandmother   . Heart disease Paternal Grandmother   . Huntington's disease Paternal Grandmother     Social History:  reports that he quit smoking about 3 months ago. His smoking use included cigarettes. He has a 10.00 pack-year smoking history. he has never used smokeless tobacco. He reports that  he does not drink alcohol or use drugs.  Allergies:  Allergies  Allergen Reactions  . Bee Venom Swelling  . Codeine Diarrhea and Nausea Only  . Sulfa Antibiotics Nausea And Vomiting    Medications reviewed.   Review of Systems:   Review of Systems  Constitutional: Negative.   HENT: Negative.   Eyes: Negative.   Respiratory: Negative.   Cardiovascular: Negative.   Gastrointestinal: Positive for diarrhea. Negative for abdominal pain, constipation, nausea and vomiting.  Genitourinary: Negative.   Musculoskeletal: Negative.   Skin: Negative.   Neurological: Negative.   Endo/Heme/Allergies: Negative.   Psychiatric/Behavioral: Negative.      Physical Exam:  There were no vitals taken for this visit.  Physical Exam  Constitutional: He is well-developed, well-nourished, and in no distress. No distress.  Genitourinary:  Genitourinary Comments: Perianal area shows well-healed area of prior I&D no sign of erythema or drainage and minimal if any induration.  Nontender.  Skin: Skin is warm and dry. No rash noted. No erythema.  Vitals reviewed.     No results found for this or any previous visit (from the past 48 hour(s)). No results found.  Assessment/Plan:  Patient doing very well following an I&D of a perirectal abscess.  He has finished his antibiotics yesterday and is experiencing loose stools.  With that in mind I will recommend obtaining  a stool sample for C. difficile PCR.  We will call him with those results.  He is still having complaints of sphincter problems following injection of Botox by Dr. Dahlia Byes.  I will arrange for him to come back in 2 weeks to see Dr. Dahlia Byes for a postop visit concerning his perianal sphincter pain with defecation which I believe is unrelated to his perirectal abscess.  The perirectal abscess has resolved.  Florene Glen, MD, FACS

## 2017-10-13 ENCOUNTER — Ambulatory Visit: Payer: BLUE CROSS/BLUE SHIELD | Admitting: Surgery

## 2017-10-15 ENCOUNTER — Ambulatory Visit: Payer: BLUE CROSS/BLUE SHIELD | Admitting: Surgery

## 2017-10-19 ENCOUNTER — Emergency Department
Admission: EM | Admit: 2017-10-19 | Discharge: 2017-10-19 | Disposition: A | Discharge status: Home / Self Care | Payer: BLUE CROSS/BLUE SHIELD | Attending: Emergency Medicine | Admitting: Emergency Medicine

## 2017-10-19 ENCOUNTER — Other Ambulatory Visit: Payer: Self-pay

## 2017-10-19 ENCOUNTER — Encounter: Payer: Self-pay | Admitting: Emergency Medicine

## 2017-10-19 DIAGNOSIS — K625 Hemorrhage of anus and rectum: Secondary | ICD-10-CM | POA: Diagnosis present

## 2017-10-19 DIAGNOSIS — Z87891 Personal history of nicotine dependence: Secondary | ICD-10-CM | POA: Insufficient documentation

## 2017-10-19 DIAGNOSIS — K649 Unspecified hemorrhoids: Secondary | ICD-10-CM | POA: Diagnosis not present

## 2017-10-19 DIAGNOSIS — Z79899 Other long term (current) drug therapy: Secondary | ICD-10-CM | POA: Insufficient documentation

## 2017-10-19 LAB — TYPE AND SCREEN
ABO/RH(D): O POS
Antibody Screen: NEGATIVE

## 2017-10-19 LAB — URINALYSIS, COMPLETE (UACMP) WITH MICROSCOPIC
Bacteria, UA: NONE SEEN
Bilirubin Urine: NEGATIVE
Glucose, UA: NEGATIVE mg/dL
Ketones, ur: NEGATIVE mg/dL
Leukocytes, UA: NEGATIVE
Nitrite: NEGATIVE
PROTEIN: NEGATIVE mg/dL
RBC / HPF: NONE SEEN RBC/hpf (ref 0–5)
Specific Gravity, Urine: 1.001 — ABNORMAL LOW (ref 1.005–1.030)
Squamous Epithelial / LPF: NONE SEEN
WBC UA: NONE SEEN WBC/hpf (ref 0–5)
pH: 6 (ref 5.0–8.0)

## 2017-10-19 LAB — COMPREHENSIVE METABOLIC PANEL
ALT: 31 U/L (ref 17–63)
AST: 30 U/L (ref 15–41)
Albumin: 4.2 g/dL (ref 3.5–5.0)
Alkaline Phosphatase: 54 U/L (ref 38–126)
Anion gap: 7 (ref 5–15)
BUN: 11 mg/dL (ref 6–20)
CHLORIDE: 102 mmol/L (ref 101–111)
CO2: 27 mmol/L (ref 22–32)
CREATININE: 0.59 mg/dL — AB (ref 0.61–1.24)
Calcium: 9.5 mg/dL (ref 8.9–10.3)
GFR calc Af Amer: >60 mL/min (ref >60)
GFR calc non Af Amer: >60 mL/min (ref >60)
GLUCOSE: 80 mg/dL (ref 65–99)
Potassium: 4 mmol/L (ref 3.5–5.1)
SODIUM: 136 mmol/L (ref 135–145)
Total Bilirubin: 1.2 mg/dL (ref 0.3–1.2)
Total Protein: 8.2 g/dL — ABNORMAL HIGH (ref 6.5–8.1)

## 2017-10-19 LAB — CBC
HCT: 41.5 % (ref 40.0–52.0)
Hemoglobin: 14.1 g/dL (ref 13.0–18.0)
MCH: 29.5 pg (ref 26.0–34.0)
MCHC: 34.1 g/dL (ref 32.0–36.0)
MCV: 86.6 fL (ref 80.0–100.0)
PLATELETS: 174 10*3/uL (ref 150–440)
RBC: 4.79 MIL/uL (ref 4.40–5.90)
RDW: 14.4 % (ref 11.5–14.5)
WBC: 4.2 10*3/uL (ref 3.8–10.6)

## 2017-10-19 MED ORDER — ACETAMINOPHEN 325 MG PO TABS
ORAL_TABLET | ORAL | Status: AC
  Administered 2017-10-19: 650 mg via ORAL
  Filled 2017-10-19: qty 2

## 2017-10-19 MED ORDER — ACETAMINOPHEN 325 MG PO TABS
650.0000 mg | ORAL_TABLET | Freq: Once | ORAL | Status: AC
  Administered 2017-10-19: 650 mg via ORAL

## 2017-10-19 NOTE — ED Notes (Signed)
FIRST NURSE NOTE:  Pt c/o rectal bleeding - dark stools off and on today. Also c/o rectal pain.

## 2017-10-19 NOTE — ED Notes (Signed)
Pt awake and alert, oriented x 3; sitting on end of stretcher; reports several episodes today of rectal bleeding; this am says he had a liquid stool with bright red bleeding; subsequent bowel movements to day have been hard stool with darker red bleeding; says he did have to strain some with bowel movements; history of hemorrhoids; reports low midline abd pain/tenderness; denies urinary s/s;

## 2017-10-19 NOTE — ED Triage Notes (Signed)
Pt presents to ED via POV c/o rectal bleeding starting today. Has hx hemorrhoids but has not had any bleeding for several years. C/o mid abd pain 6/10.

## 2017-10-19 NOTE — ED Provider Notes (Addendum)
[facility] Emergency Department Provider Note  ____________________________________________   I have reviewed the triage vital signs and the nursing notes. Where available I have reviewed prior notes and, if possible and indicated, outside hospital notes.    HISTORY  Chief Complaint Rectal Bleeding    HPI Jeffrey Prince is a 51 y.o. male with a history of chronic abdominal pain hemorrhoids, internal hemorrhoids, for which she has received Botox, diverticulosis in the past, multiple negative CT scans for abdominal pain, presents today with mild occasional slight cramping and hematochezia which she noticed a couple times on the stool and toilet paper today.  Normal bowel movements slightly loose but no liquid stool, no melena but he did have a couple episodes of bright red blood per rectum.  This is very consistent with multiple prior presentations and complaints.  Patient is never had a drop in his hemoglobin from his bleeding and this is usually thought to be secondary to his hemorrhoids.     Past Medical History:  Diagnosis Date  . Acne   . Blind left eye    Since birth  . Chorea   . Diverticulosis   . ED (erectile dysfunction)   . GERD (gastroesophageal reflux disease)   . Hemorrhoids   . Weight loss    Abnormal    Patient Active Problem List   Diagnosis Date Noted  . Anal fissure   . Family history of Huntington's chorea 07/01/2017  . Unintentional weight loss 05/16/2015  . Diarrhea 05/16/2015  . GERD (gastroesophageal reflux disease) 05/16/2015  . Diffuse abdominal pain 05/16/2015  . Dyslipidemia (high LDL; low HDL) 01/25/2015  . Decreased visual acuity 01/24/2015  . Erectile dysfunction 01/24/2015  . Current every day smoker 01/24/2015  . 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 Hemorrhoids 03/16/2014    Past Surgical History:  Procedure Laterality Date  . BOTOX INJECTION N/A 10/09/2016   Procedure: BOTOX INJECTION;  Surgeon:  Jules Husbands, MD;  Location: [facility];  Service: General;  Laterality: N/A;  . BOTOX INJECTION N/A 07/23/2017   Procedure: BOTOX INJECTION;  Surgeon: Jules Husbands, MD;  Location: [facility];  Service: General;  Laterality: N/A;  . COLONOSCOPY     with polyp removal  . EVALUATION UNDER ANESTHESIA WITH ANAL FISSUROTOMY N/A 10/09/2016   Procedure: EXAM UNDER ANESTHESIA WITH ANAL FISSUROTOMY chemical sphincterotomy w BOtox.;  Surgeon: Jules Husbands, MD;  Location: [facility];  Service: General;  Laterality: N/A;  Lithotomy, make sure botox is in the room  . EVALUATION UNDER ANESTHESIA WITH ANAL FISSUROTOMY N/A 07/23/2017   Procedure: EXAM UNDER ANESTHESIA WITH ANAL FISSUROTOMY, botox injection;  Surgeon: Jules Husbands, MD;  Location: [facility];  Service: General;  Laterality: N/A;  . HEMORRHOID SURGERY    . ROTATOR CUFF REPAIR  1991    Prior to Admission medications   Medication Sig Start Date End Date Taking? Authorizing Provider  dicyclomine (BENTYL) 20 MG tablet Take 1 tablet (20 mg total) by mouth 3 (three) times daily before meals. 06/23/17   Lucilla Lame, MD  diltiazem 2 % GEL Apply 1 application topically 3 (three) times daily. Apply gel topically three times a day on your rectal area. 04/21/17   Pabon, [place], MD  hydrocortisone (ANUSOL-HC) 2.5 % rectal cream Place 1 application rectally 3 (three) times daily. 08/06/17   Pabon, Diego F, MD  hyoscyamine (ANASPAZ) 0.125 MG TBDP disintergrating tablet Place 0.125 mg under the tongue every 4 (four) hours as needed for  cramping.     [provider]  Lidocaine HCl (LIDOCIN) 3 % GEL Apply 1 application topically 3 (three) times daily as needed (for pain.).    [provider]  Nitroglycerin 0.4 % OINT Place 1 application rectally 2 (two) times daily. 06/30/17   Pabon, Marjory Lies, MD  ranitidine (ZANTAC) 150 MG tablet Take 1 tablet (150 mg total) by mouth 2 (two) times daily. 12/14/15   Funches, Adriana Mccallum, MD  tadalafil (CIALIS) 10 MG tablet  Take 10 mg by mouth daily as needed for erectile dysfunction.    [provider]    Allergies Bee venom; Codeine; and Sulfa antibiotics  Family History  Problem Relation Age of Onset  . Breast cancer Mother   . Huntington's disease Father   . Cancer Paternal Uncle        Colon  . Diabetes Son   . Cancer Maternal Grandfather   . Cancer Paternal Grandmother   . Heart disease Paternal Grandmother   . Huntington's disease Paternal Grandmother     Social History Social History   Tobacco Use  . Smoking status: Former Smoker    Packs/day: 0.50    Years: 20.00    Pack years: 10.00    Types: Cigarettes    Last attempt to quit: 06/16/2017    Years since quitting: 0.3  . Smokeless tobacco: Never Used  Substance Use Topics  . Alcohol use: No    Alcohol/week: 0.0 oz  . Drug use: No    Review of Systems Constitutional: No fever/chills Eyes: No visual changes. ENT: No sore throat. No stiff neck no neck pain Cardiovascular: Denies chest pain. Respiratory: Denies shortness of breath. Gastrointestinal:   no vomiting.  No diarrhea.  No constipation. Genitourinary: Negative for dysuria. Musculoskeletal: Negative lower extremity swelling Skin: Negative for rash. Neurological: Negative for severe headaches, focal weakness or numbness.   ____________________________________________   PHYSICAL EXAM:  VITAL SIGNS: ED Triage Vitals  Enc Vitals Group     BP 10/19/17 1839 129/69     Pulse Rate 10/19/17 1839 (!) 56     Resp 10/19/17 1839 16     Temp 10/19/17 1839 98.7 F (37.1 C)     Temp Source 10/19/17 1839 Oral     SpO2 10/19/17 1839 100 %     Weight 10/19/17 1839 145 lb (65.8 kg)     Height 10/19/17 1839 5\' 10"  (1.778 m)     Head Circumference --      Peak Flow --      Pain Score 10/19/17 1853 6     Pain Loc --      Pain Edu? --      Excl. in [place]? --     Constitutional: Alert and oriented. Well appearing and in no acute distress. Eyes: Conjunctivae are  normal Head: Atraumatic HEENT: No congestion/rhinnorhea. Mucous membranes are moist.  Oropharynx non-erythematous Neck:   Nontender with no meningismus, no masses, no stridor Cardiovascular: Normal rate, regular rhythm. Grossly normal heart sounds.  Good peripheral circulation. Respiratory: Normal respiratory effort.  No retractions. Lungs CTAB. Abdominal: Soft and nontender. No distention. No guarding no rebound Back:  There is no focal tenderness or step off.  there is no midline tenderness there are no lesions noted. there is no CVA tenderness Exam: There is normal-appearing stool and bright red blood noticed on the nasal verge with no evidence of external hemorrhoids there are palpated what are likely internal hemorrhoids.  There is nothing to suggest abscess or  inflammation or infection Musculoskeletal: No lower extremity tenderness, no upper extremity tenderness. No joint effusions, no DVT signs strong distal pulses no edema Neurologic:  Normal speech and language. No gross focal neurologic deficits are appreciated.  Skin:  Skin is warm, dry and intact. No rash noted. Psychiatric: Mood and affect are normal. Speech and behavior are normal.  ____________________________________________   LABS (all labs ordered are listed, but only abnormal results are displayed)  Labs Reviewed  COMPREHENSIVE METABOLIC PANEL - Abnormal; Notable for the following components:      Result Value   Creatinine, Ser 0.59 (*)    Total Protein 8.2 (*)    All other components within normal limits  URINALYSIS, COMPLETE (UACMP) WITH MICROSCOPIC - Abnormal; Notable for the following components:   Color, Urine COLORLESS (*)    APPearance CLEAR (*)    Specific Gravity, Urine 1.001 (*)    Hgb urine dipstick SMALL (*)    All other components within normal limits  CBC  TYPE AND SCREEN    Pertinent labs  results that were available during my care of the patient were reviewed by me and considered in my medical  decision making (see chart for details). ____________________________________________  EKG  I personally interpreted any EKGs ordered by me or triage  ____________________________________________  RADIOLOGY  Pertinent labs & imaging results that were available during my care of the patient were reviewed by me and considered in my medical decision making (see chart for details). If possible, patient and/or family made aware of any abnormal findings.  No results found. ____________________________________________    PROCEDURES  Procedure(s) performed: None  Procedures  Critical Care performed: None  ____________________________________________   INITIAL IMPRESSION / ASSESSMENT AND PLAN / ED COURSE  Pertinent labs & imaging results that were available during my care of the patient were reviewed by me and considered in my medical decision making (see chart for details).  Workup very reassuring here, patient is having scant bright red blood per rectum but he has had this multiple times before abdomen is completely benign, white count is normal hemoglobin is normal vital signs are reassuring we will discharge the patient with close outpatient follow-up and return precautions given and understood.  Serial abdominal exams are benign.  Considering the patient's symptoms, medical history, and physical examination today, I have low suspicion for cholecystitis or biliary pathology, pancreatitis, perforation or bowel obstruction, hernia, intra-abdominal abscess, AAA or dissection, volvulus or intussusception, mesenteric ischemia, ischemic gut, pyelonephritis or appendicitis.    ____________________________________________   FINAL CLINICAL IMPRESSION(S) / ED DIAGNOSES  Final diagnoses:  None      This chart was dictated using voice recognition software.  Despite best efforts to proofread,  errors can occur which can change meaning.      Schuyler Amor, MD 10/19/17 2112     Schuyler Amor, MD 10/19/17 2112

## 2017-10-22 ENCOUNTER — Ambulatory Visit: Payer: BLUE CROSS/BLUE SHIELD | Admitting: Surgery

## 2017-10-29 ENCOUNTER — Encounter: Payer: Self-pay | Admitting: Emergency Medicine

## 2017-10-29 ENCOUNTER — Observation Stay
Admission: EM | Admit: 2017-10-29 | Discharge: 2017-10-31 | Disposition: A | Discharge status: Other Acute Inpatient Hospital | Payer: BLUE CROSS/BLUE SHIELD | Attending: Internal Medicine | Admitting: Internal Medicine

## 2017-10-29 ENCOUNTER — Emergency Department: Payer: BLUE CROSS/BLUE SHIELD

## 2017-10-29 DIAGNOSIS — H5462 Unqualified visual loss, left eye, normal vision right eye: Secondary | ICD-10-CM | POA: Insufficient documentation

## 2017-10-29 DIAGNOSIS — R079 Chest pain, unspecified: Secondary | ICD-10-CM | POA: Diagnosis present

## 2017-10-29 DIAGNOSIS — Z87891 Personal history of nicotine dependence: Secondary | ICD-10-CM | POA: Insufficient documentation

## 2017-10-29 DIAGNOSIS — R001 Bradycardia, unspecified: Secondary | ICD-10-CM | POA: Insufficient documentation

## 2017-10-29 DIAGNOSIS — K219 Gastro-esophageal reflux disease without esophagitis: Secondary | ICD-10-CM | POA: Diagnosis not present

## 2017-10-29 DIAGNOSIS — N529 Male erectile dysfunction, unspecified: Secondary | ICD-10-CM | POA: Diagnosis not present

## 2017-10-29 DIAGNOSIS — E785 Hyperlipidemia, unspecified: Secondary | ICD-10-CM | POA: Diagnosis not present

## 2017-10-29 DIAGNOSIS — Z79899 Other long term (current) drug therapy: Secondary | ICD-10-CM | POA: Diagnosis not present

## 2017-10-29 DIAGNOSIS — R0789 Other chest pain: Secondary | ICD-10-CM | POA: Diagnosis not present

## 2017-10-29 DIAGNOSIS — I2510 Atherosclerotic heart disease of native coronary artery without angina pectoris: Principal | ICD-10-CM | POA: Insufficient documentation

## 2017-10-29 LAB — CBC
HEMATOCRIT: 41.8 % (ref 40.0–52.0)
Hemoglobin: 14 g/dL (ref 13.0–18.0)
MCH: 29.1 pg (ref 26.0–34.0)
MCHC: 33.6 g/dL (ref 32.0–36.0)
MCV: 86.7 fL (ref 80.0–100.0)
Platelets: 216 10*3/uL (ref 150–440)
RBC: 4.82 MIL/uL (ref 4.40–5.90)
RDW: 14.2 % (ref 11.5–14.5)
WBC: 4.3 10*3/uL (ref 3.8–10.6)

## 2017-10-29 LAB — BASIC METABOLIC PANEL
Anion gap: 6 (ref 5–15)
BUN: 12 mg/dL (ref 6–20)
CO2: 26 mmol/L (ref 22–32)
CREATININE: 0.99 mg/dL (ref 0.61–1.24)
Calcium: 9.2 mg/dL (ref 8.9–10.3)
Chloride: 105 mmol/L (ref 101–111)
GFR calc Af Amer: >60 mL/min (ref >60)
GLUCOSE: 86 mg/dL (ref 65–99)
POTASSIUM: 3.9 mmol/L (ref 3.5–5.1)
Sodium: 137 mmol/L (ref 135–145)

## 2017-10-29 LAB — TROPONIN I: Troponin I: <0.03 ng/mL (ref <0.03)

## 2017-10-29 NOTE — ED Triage Notes (Signed)
Patient presents to ED via POV from home with c/o CP that began today. Patient states, "last time it was a blockage but they put me on medication". Even and non labored respirations noted.

## 2017-10-29 NOTE — ED Notes (Addendum)
Pt to the er for chest pain. Recently dx with low heart rate and a blockage. Pt sees Dr Humphrey Rolls. Pain started today. Pain is the left side of the chest around the heart. Pt says this is the first time it has been this intense.

## 2017-10-30 ENCOUNTER — Encounter: Payer: Self-pay | Admitting: Certified Registered Nurse Anesthetist

## 2017-10-30 ENCOUNTER — Ambulatory Visit: Payer: BLUE CROSS/BLUE SHIELD | Admitting: Surgery

## 2017-10-30 ENCOUNTER — Encounter: Payer: Self-pay | Admitting: *Deleted

## 2017-10-30 ENCOUNTER — Encounter
Admission: EM | Disposition: A | Discharge status: Other Acute Inpatient Hospital | Payer: Self-pay | Source: Home / Self Care | Attending: Emergency Medicine

## 2017-10-30 ENCOUNTER — Other Ambulatory Visit: Payer: Self-pay

## 2017-10-30 DIAGNOSIS — R079 Chest pain, unspecified: Secondary | ICD-10-CM | POA: Diagnosis present

## 2017-10-30 HISTORY — PX: LEFT HEART CATH AND CORONARY ANGIOGRAPHY: CATH118249

## 2017-10-30 LAB — HEMOGLOBIN A1C
Hgb A1c MFr Bld: 5 % (ref 4.8–5.6)
Mean Plasma Glucose: 96.8 mg/dL

## 2017-10-30 LAB — BASIC METABOLIC PANEL
ANION GAP: 4 — AB (ref 5–15)
BUN: 10 mg/dL (ref 6–20)
CHLORIDE: 106 mmol/L (ref 101–111)
CO2: 28 mmol/L (ref 22–32)
Calcium: 9.1 mg/dL (ref 8.9–10.3)
Creatinine, Ser: 0.73 mg/dL (ref 0.61–1.24)
GFR calc Af Amer: >60 mL/min (ref >60)
Glucose, Bld: 93 mg/dL (ref 65–99)
POTASSIUM: 4.5 mmol/L (ref 3.5–5.1)
SODIUM: 138 mmol/L (ref 135–145)

## 2017-10-30 LAB — CBC
HCT: 44 % (ref 40.0–52.0)
HEMOGLOBIN: 14.5 g/dL (ref 13.0–18.0)
MCH: 28.8 pg (ref 26.0–34.0)
MCHC: 32.9 g/dL (ref 32.0–36.0)
MCV: 87.7 fL (ref 80.0–100.0)
PLATELETS: 234 10*3/uL (ref 150–440)
RBC: 5.02 MIL/uL (ref 4.40–5.90)
RDW: 14.2 % (ref 11.5–14.5)
WBC: 3.7 10*3/uL — AB (ref 3.8–10.6)

## 2017-10-30 LAB — PROTIME-INR
INR: 0.96
PROTHROMBIN TIME: 12.7 s (ref 11.4–15.2)

## 2017-10-30 LAB — TROPONIN I
Troponin I: <0.03 ng/mL (ref <0.03)
Troponin I: <0.03 ng/mL (ref <0.03)
Troponin I: <0.03 ng/mL (ref <0.03)

## 2017-10-30 LAB — TSH: TSH: 1.252 u[IU]/mL (ref 0.350–4.500)

## 2017-10-30 SURGERY — LEFT HEART CATH AND CORONARY ANGIOGRAPHY
Anesthesia: Moderate Sedation | Laterality: Right

## 2017-10-30 MED ORDER — LIDOCAINE HCL 3 % EX GEL
1.0000 | Freq: Three times a day (TID) | CUTANEOUS | Status: DC | PRN
Start: 2017-10-30 — End: 2017-10-30

## 2017-10-30 MED ORDER — MIDAZOLAM HCL 2 MG/2ML IJ SOLN
INTRAMUSCULAR | Status: DC | PRN
  Administered 2017-10-30 (×2): 0.5 mg via INTRAVENOUS

## 2017-10-30 MED ORDER — ASPIRIN 81 MG PO CHEW
81.0000 mg | CHEWABLE_TABLET | Freq: Once | ORAL | Status: AC
  Administered 2017-10-30: 81 mg via ORAL
  Filled 2017-10-30: qty 1

## 2017-10-30 MED ORDER — MIDAZOLAM HCL 2 MG/2ML IJ SOLN
INTRAMUSCULAR | Status: AC
  Filled 2017-10-30: qty 2

## 2017-10-30 MED ORDER — SODIUM CHLORIDE 0.9% FLUSH: 3.0000 mL | INTRAVENOUS | Status: DC | PRN

## 2017-10-30 MED ORDER — HYOSCYAMINE SULFATE 0.125 MG PO TBDP
0.1250 mg | ORAL_TABLET | ORAL | Status: DC | PRN
  Filled 2017-10-30: qty 1

## 2017-10-30 MED ORDER — SODIUM CHLORIDE 0.9% FLUSH: 3.0000 mL | Freq: Two times a day (BID) | INTRAVENOUS | Status: DC

## 2017-10-30 MED ORDER — FAMOTIDINE 20 MG PO TABS
20.0000 mg | ORAL_TABLET | Freq: Two times a day (BID) | ORAL | Status: DC
  Administered 2017-10-30 (×2): 20 mg via ORAL
  Filled 2017-10-30 (×2): qty 1

## 2017-10-30 MED ORDER — SODIUM CHLORIDE 0.9 % WEIGHT BASED INFUSION
1.0000 mL/kg/h | INTRAVENOUS | Status: AC
  Administered 2017-10-30: 1 mL/kg/h via INTRAVENOUS

## 2017-10-30 MED ORDER — ONDANSETRON HCL 4 MG PO TABS: 4.0000 mg | ORAL_TABLET | Freq: Four times a day (QID) | ORAL | Status: DC | PRN

## 2017-10-30 MED ORDER — MORPHINE SULFATE (PF) 2 MG/ML IV SOLN
INTRAVENOUS | Status: AC
  Filled 2017-10-30: qty 1

## 2017-10-30 MED ORDER — FENTANYL CITRATE (PF) 100 MCG/2ML IJ SOLN
INTRAMUSCULAR | Status: AC
  Filled 2017-10-30: qty 2

## 2017-10-30 MED ORDER — LIDOCAINE HCL 2 % EX GEL
1.0000 "application " | Freq: Three times a day (TID) | CUTANEOUS | Status: DC | PRN
  Filled 2017-10-30: qty 30

## 2017-10-30 MED ORDER — MORPHINE SULFATE (PF) 2 MG/ML IV SOLN
2.0000 mg | Freq: Once | INTRAVENOUS | Status: AC
  Administered 2017-10-30: 2 mg via INTRAVENOUS

## 2017-10-30 MED ORDER — ACETAMINOPHEN 325 MG PO TABS: 650.0000 mg | ORAL_TABLET | Freq: Four times a day (QID) | ORAL | Status: DC | PRN

## 2017-10-30 MED ORDER — NITROGLYCERIN 2 % TD OINT
1.0000 [in_us] | TOPICAL_OINTMENT | Freq: Four times a day (QID) | TRANSDERMAL | Status: DC
  Administered 2017-10-30 – 2017-10-31 (×3): 1 [in_us] via TOPICAL
  Filled 2017-10-30 (×3): qty 1

## 2017-10-30 MED ORDER — ATORVASTATIN CALCIUM 20 MG PO TABS
20.0000 mg | ORAL_TABLET | Freq: Every day | ORAL | Status: DC
  Administered 2017-10-30: 20 mg via ORAL
  Filled 2017-10-30: qty 1

## 2017-10-30 MED ORDER — ACETAMINOPHEN 325 MG PO TABS: 650.0000 mg | ORAL_TABLET | ORAL | Status: DC | PRN

## 2017-10-30 MED ORDER — ENOXAPARIN SODIUM 40 MG/0.4ML ~~LOC~~ SOLN
40.0000 mg | SUBCUTANEOUS | Status: DC
  Administered 2017-10-30: 40 mg via SUBCUTANEOUS
  Filled 2017-10-30: qty 0.4

## 2017-10-30 MED ORDER — FENTANYL CITRATE (PF) 100 MCG/2ML IJ SOLN
INTRAMUSCULAR | Status: DC | PRN
  Administered 2017-10-30 (×2): 25 ug via INTRAVENOUS

## 2017-10-30 MED ORDER — ENSURE ENLIVE PO LIQD: 237.0000 mL | Freq: Two times a day (BID) | ORAL | Status: DC

## 2017-10-30 MED ORDER — SODIUM CHLORIDE 0.9 % WEIGHT BASED INFUSION: 1.0000 mL/kg/h | INTRAVENOUS | Status: DC

## 2017-10-30 MED ORDER — ACETAMINOPHEN 650 MG RE SUPP: 650.0000 mg | Freq: Four times a day (QID) | RECTAL | Status: DC | PRN

## 2017-10-30 MED ORDER — ISOSORBIDE DINITRATE 30 MG PO TABS
30.0000 mg | ORAL_TABLET | Freq: Every day | ORAL | Status: DC
  Administered 2017-10-30: 30 mg via ORAL
  Filled 2017-10-30 (×2): qty 1

## 2017-10-30 MED ORDER — ASPIRIN 81 MG PO CHEW: 81.0000 mg | CHEWABLE_TABLET | ORAL | Status: DC

## 2017-10-30 MED ORDER — HEPARIN (PORCINE) IN NACL 2-0.9 UNIT/ML-% IJ SOLN
INTRAMUSCULAR | Status: AC
Start: 2017-10-30 — End: 2017-10-30
  Filled 2017-10-30: qty 500

## 2017-10-30 MED ORDER — SODIUM CHLORIDE 0.9 % IV SOLN
250.0000 mL | INTRAVENOUS | Status: DC | PRN
Start: 2017-10-30 — End: 2017-10-30

## 2017-10-30 MED ORDER — NITROGLYCERIN 0.4 % RE OINT: 1.0000 "application " | TOPICAL_OINTMENT | Freq: Two times a day (BID) | RECTAL | Status: DC

## 2017-10-30 MED ORDER — SODIUM CHLORIDE 0.9 % WEIGHT BASED INFUSION: 3.0000 mL/kg/h | INTRAVENOUS | Status: DC

## 2017-10-30 MED ORDER — DOCUSATE SODIUM 100 MG PO CAPS
100.0000 mg | ORAL_CAPSULE | Freq: Two times a day (BID) | ORAL | Status: DC
  Administered 2017-10-30 (×2): 100 mg via ORAL
  Filled 2017-10-30 (×2): qty 1

## 2017-10-30 MED ORDER — SODIUM CHLORIDE 0.9 % IV SOLN: 250.0000 mL | INTRAVENOUS | Status: DC | PRN

## 2017-10-30 MED ORDER — IOPAMIDOL (ISOVUE-300) INJECTION 61%
INTRAVENOUS | Status: DC | PRN
  Administered 2017-10-30: 110 mL via INTRA_ARTERIAL

## 2017-10-30 MED ORDER — ONDANSETRON HCL 4 MG/2ML IJ SOLN: 4.0000 mg | Freq: Four times a day (QID) | INTRAMUSCULAR | Status: DC | PRN

## 2017-10-30 MED ORDER — DICYCLOMINE HCL 20 MG PO TABS
20.0000 mg | ORAL_TABLET | Freq: Three times a day (TID) | ORAL | Status: DC
  Filled 2017-10-30 (×2): qty 1

## 2017-10-30 MED ORDER — HYDROCORTISONE 2.5 % RE CREA
1.0000 "application " | TOPICAL_CREAM | Freq: Three times a day (TID) | RECTAL | Status: DC
  Administered 2017-10-30: 1 via RECTAL
  Filled 2017-10-30 (×2): qty 28.35

## 2017-10-30 MED ORDER — NITROGLYCERIN 2 % TD OINT
0.5000 [in_us] | TOPICAL_OINTMENT | TRANSDERMAL | Status: AC
  Administered 2017-10-30: 0.5 [in_us] via TOPICAL
  Filled 2017-10-30: qty 1

## 2017-10-30 SURGICAL SUPPLY — 9 items
CATH INFINITI 5FR ANG PIGTAIL (CATHETERS) ×3 IMPLANT
CATH INFINITI 5FR JL4 (CATHETERS) ×3 IMPLANT
CATH INFINITI JR4 5F (CATHETERS) ×3 IMPLANT
DEVICE CLOSURE MYNXGRIP 5F (Vascular Products) ×3 IMPLANT
KIT MANI 3VAL PERCEP (MISCELLANEOUS) ×3 IMPLANT
NEEDLE PERC 18GX7CM (NEEDLE) ×3 IMPLANT
PACK CARDIAC CATH (CUSTOM PROCEDURE TRAY) ×3 IMPLANT
SHEATH PINNACLE 5F 10CM (SHEATH) ×3 IMPLANT
WIRE GUIDERIGHT .035X150 (WIRE) ×3 IMPLANT

## 2017-10-30 NOTE — Consult Note (Signed)
Jeffrey Prince is a 51 y.o. male  [id number]  Primary Cardiologist: Neoma Laming Reason for Consultation: chest pain  HPI: [id number] presented with chest pain and weakness.   Review of Systems: NO sysncope   Past Medical History:  Diagnosis Date  . Acne   . Blind left eye    Since birth  . Chorea   . Diverticulosis   . ED (erectile dysfunction)   . GERD (gastroesophageal reflux disease)   . Hemorrhoids   . Weight loss    Abnormal    Facility-Administered Medications Prior to Admission  Medication Dose Route Frequency Provider Last Rate Last Dose  . botulinum toxin Type A (BOTOX) injection 50 Units  50 Units Intramuscular Once Pabon, Diego F, MD       Medications Prior to Admission  Medication Sig Dispense Refill  . atorvastatin (LIPITOR) 20 MG tablet Take 20 mg by mouth daily.    . isosorbide dinitrate (ISORDIL) 30 MG tablet Take 30 mg by mouth daily.    . Nitroglycerin 0.4 % OINT Place 1 application rectally 2 (two) times daily. 1 Tube 1  . ranitidine (ZANTAC) 150 MG tablet Take 1 tablet (150 mg total) by mouth 2 (two) times daily. 30 tablet 5  . dicyclomine (BENTYL) 20 MG tablet Take 1 tablet (20 mg total) by mouth 3 (three) times daily before meals. (Patient not taking: Reported on 10/30/2017) 90 tablet 2  . diltiazem 2 % GEL Apply 1 application topically 3 (three) times daily. Apply gel topically three times a day on your rectal area. (Patient not taking: Reported on 10/30/2017) 1 Package 1  . hydrocortisone (ANUSOL-HC) 2.5 % rectal cream Place 1 application rectally 3 (three) times daily. (Patient not taking: Reported on 10/30/2017) 30 g 2  . hyoscyamine (ANASPAZ) 0.125 MG TBDP disintergrating tablet Place 0.125 mg under the tongue every 4 (four) hours as needed for cramping.     . Lidocaine HCl (LIDOCIN) 3 % GEL Apply 1 application topically 3 (three) times daily as needed (for pain.).    Marland Kitchen tadalafil (CIALIS) 10 MG tablet Take 10 mg by mouth daily as needed for  erectile dysfunction.       Marland Kitchen atorvastatin  20 mg Oral Daily  . docusate sodium  100 mg Oral BID  . enoxaparin (LOVENOX) injection  40 mg Subcutaneous Q24H  . famotidine  20 mg Oral BID  . hydrocortisone  1 application Rectal TID  . isosorbide dinitrate  30 mg Oral Daily  . Nitroglycerin  1 application Rectal BID    Infusions:   Allergies  Allergen Reactions  . Bee Venom Swelling  . Codeine Diarrhea and Nausea Only  . Sulfa Antibiotics Nausea And Vomiting    Social History   Socioeconomic History  . Marital status: Single    Spouse name: Not on file  . Number of children: Not on file  . Years of education: Not on file  . Highest education level: Not on file  Social Needs  . Financial resource strain: Not on file  . Food insecurity - worry: Not on file  . Food insecurity - inability: Not on file  . Transportation needs - medical: Not on file  . Transportation needs - non-medical: Not on file  Occupational History  . Not on file  Tobacco Use  . Smoking status: Former Smoker    Packs/day: 0.50    Years: 20.00    Pack years: 10.00    Types: Cigarettes    Last  attempt to quit: 06/16/2017    Years since quitting: 0.3  . Smokeless tobacco: Never Used  Substance and Sexual Activity  . Alcohol use: No    Alcohol/week: 0.0 oz  . Drug use: No  . Sexual activity: Not on file  Other Topics Concern  . Not on file  Social History Narrative  . Not on file    Family History  Problem Relation Age of Onset  . Breast cancer Mother   . Huntington's disease Father   . Cancer Paternal Uncle        Colon  . Diabetes Son   . Cancer Maternal Grandfather   . Cancer Paternal Grandmother   . Heart disease Paternal Grandmother   . Huntington's disease Paternal Grandmother     PHYSICAL EXAM: Vitals:   10/30/17 0520 10/30/17 0604  BP: 121/73 140/75  Pulse:  (!) 45  Resp: 17 18  Temp:  97.6 F (36.4 C)  SpO2:  100%     Intake/Output Summary (Last 24 hours) at  10/30/2017 0835 Last data filed at 10/30/2017 5009 Gross per 24 hour  Intake -  Output 450 ml  Net -450 ml    General:  Well appearing. No respiratory difficulty HEENT: normal Neck: supple. no JVD. Carotids 2+ bilat; no bruits. No lymphadenopathy or thryomegaly appreciated. Cor: PMI nondisplaced. Regular rate & rhythm. No rubs, gallops or murmurs. Lungs: clear Abdomen: soft, nontender, nondistended. No hepatosplenomegaly. No bruits or masses. Good bowel sounds. Extremities: no cyanosis, clubbing, rash, edema Neuro: alert & oriented x 3, cranial nerves grossly intact. moves all 4 extremities w/o difficulty. Affect pleasant.  ECG: Sinus bradycardia 52 /min  Results for orders placed or performed during the hospital encounter of 10/29/17 (from the past 24 hour(s))  Basic metabolic panel     Status: None   Collection Time: 10/29/17  6:10 PM  Result Value Ref Range   Sodium 137 135 - 145 mmol/L   Potassium 3.9 3.5 - 5.1 mmol/L   Chloride 105 101 - 111 mmol/L   CO2 26 22 - 32 mmol/L   Glucose, Bld 86 65 - 99 mg/dL   BUN 12 6 - 20 mg/dL   Creatinine, Ser 0.99 0.61 - 1.24 mg/dL   Calcium 9.2 8.9 - 10.3 mg/dL   GFR calc non Af Amer >60 >60 mL/min   GFR calc Af Amer >60 >60 mL/min   Anion gap 6 5 - 15  CBC     Status: None   Collection Time: 10/29/17  6:10 PM  Result Value Ref Range   WBC 4.3 3.8 - 10.6 K/uL   RBC 4.82 4.40 - 5.90 MIL/uL   Hemoglobin 14.0 13.0 - 18.0 g/dL   HCT 41.8 40.0 - 52.0 %   MCV 86.7 80.0 - 100.0 fL   MCH 29.1 26.0 - 34.0 pg   MCHC 33.6 32.0 - 36.0 g/dL   RDW 14.2 11.5 - 14.5 %   Platelets 216 150 - 440 K/uL  Troponin I     Status: None   Collection Time: 10/29/17  6:10 PM  Result Value Ref Range   Troponin I <0.03 <0.03 ng/mL  Troponin I     Status: None   Collection Time: 10/29/17  9:23 PM  Result Value Ref Range   Troponin I <0.03 <0.03 ng/mL  TSH     Status: None   Collection Time: 10/30/17  6:05 AM  Result Value Ref Range   TSH 1.252 0.350 -  4.500 uIU/mL  Troponin  I     Status: None   Collection Time: 10/30/17  6:05 AM  Result Value Ref Range   Troponin I <0.03 <0.03 ng/mL   Dg Chest 2 View  Result Date: 10/29/2017 CLINICAL DATA:  Chest pain EXAM: CHEST  2 VIEW COMPARISON:  01/12/2016 FINDINGS: Mild hyperinflation appearing no focal pneumonia, collapse or consolidation. Normal heart size and vascularity. Healed rib fractures bilaterally. Negative for edema, effusion or pneumothorax. Trachea is midline. Postop changes of the left shoulder. IMPRESSION: Stable hyperinflation without acute chest process. Electronically Signed   By: Jerilynn Mages.  Shick M.D.   On: 10/29/2017 18:50     ASSESSMENT AND PLAN: Atypical chest pain with h/o mild CAD on CTA coronaries, advise continue lipitor, asp 81 and isosorbide as r/o for MI. Sinus bradycardia is chronic and and has no syncope to warrant PPM. May go home with f/u 10 days in office.  Dandria Griego A

## 2017-10-30 NOTE — Progress Notes (Signed)
Spoke with Dr. Humphrey Rolls regarding the pt's 5/10 chest pain.  Dr. Humphrey Rolls spoke with the patient on the phone and told him he will need a cardiac cath today.

## 2017-10-30 NOTE — Progress Notes (Signed)
Patient is having 5/10 chest pain and actually CCTA had moderate to severe mid RCA disease with severe calcification of LAD without obstructive disease, ca score 203. Advise cath.

## 2017-10-30 NOTE — ED Notes (Signed)
Pt given crackers and water per request 

## 2017-10-30 NOTE — Progress Notes (Signed)
Initial Nutrition Assessment  DOCUMENTATION CODES:   Not applicable  INTERVENTION:  1. Ensure Enlive po BID, each supplement provides 350 kcal and 20 grams of protein with diet advancement  NUTRITION DIAGNOSIS:   Inadequate oral intake related to poor appetite as evidenced by per patient/family report, percent weight loss  GOAL:   Patient will meet greater than or equal to 90% of their needs  MONITOR:   PO intake, Supplement acceptance, Labs, Weight trends, I & O's  REASON FOR ASSESSMENT:   Malnutrition Screening Tool    ASSESSMENT:   Jeffrey Prince is a 51 yo male with PMH of abnormal weight loss and sharp L-sided chest pain.    Spoke with patient at bedside. He states his appetite has been poor for 1-2 months with 10 pound weight loss over the past month. States he normally eats eggs and toast for breakfast, a sandwich for lunch, and a tv dinner or similar type meal for dinner. Had eggs and bacon this morning. Feels like his appetite is getting better.  Meal Completion: 90%  Labs reviewed Medications reviewed and include:  Colace  15 pound/10.1% severe weight loss over 1 month.  NUTRITION - FOCUSED PHYSICAL EXAM:    Most Recent Value  Orbital Region  No depletion  Upper Arm Region  No depletion  Thoracic and Lumbar Region  No depletion  Buccal Region  Mild depletion  Temple Region  Mild depletion  Clavicle Bone Region  No depletion  Clavicle and Acromion Bone Region  No depletion  Scapular Bone Region  No depletion  Dorsal Hand  No depletion  Patellar Region  No depletion  Anterior Thigh Region  No depletion  Posterior Calf Region  No depletion  Edema (RD Assessment)  None  Hair  Reviewed  Eyes  Reviewed  Mouth  Reviewed  Skin  Reviewed  Nails  Reviewed       Diet Order:  Diet NPO time specified  EDUCATION NEEDS:   No education needs have been identified at this time  Skin:  Skin Assessment: Reviewed RN Assessment  Last BM:  PTA  Height:    Ht Readings from Last 1 Encounters:  10/30/17 5\' 10"  (1.778 m)    Weight:   Wt Readings from Last 1 Encounters:  10/30/17 133 lb (60.3 kg)    Ideal Body Weight:  75.45 kg  BMI:  Body mass index is 19.08 kg/m.  Estimated Nutritional Needs:   Kcal:  6387-5643 calories (MSJ x1.2-1.4)  Protein:  79-91 grams (1.3-1.5g/kg)  Fluid:  1.8-2.1L  Satira Anis. Geraldina Parrott, MS, RD LDN Inpatient Clinical Dietitian Pager [phone]

## 2017-10-30 NOTE — Progress Notes (Signed)
Patient has severe 3 vessel disease with normal LVEF. Called [facility] and DR Deforest Hoyles CT surgery will take patient today or tomoorrow

## 2017-10-30 NOTE — Progress Notes (Signed)
[facility] NAME: Jeffrey Prince    MR#:  [id number]  DATE OF BIRTH:  12-Mar-1966  SUBJECTIVE:  CHIEF COMPLAINT:   Chief Complaint  Patient presents with  . Chest Pain     Recurrent chest pain, Initially cardio suggest for dc but later reported having severe blockages on CTA coronary and he cont to have chest pain, so planned for cath today.  REVIEW OF SYSTEMS:  CONSTITUTIONAL: No fever, fatigue or weakness.  EYES: No blurred or double vision.  EARS, NOSE, AND THROAT: No tinnitus or ear pain.  RESPIRATORY: No cough, shortness of breath, wheezing or hemoptysis.  CARDIOVASCULAR: No chest pain, orthopnea, edema.  GASTROINTESTINAL: No nausea, vomiting, diarrhea or abdominal pain.  GENITOURINARY: No dysuria, hematuria.  ENDOCRINE: No polyuria, nocturia,  HEMATOLOGY: No anemia, easy bruising or bleeding SKIN: No rash or lesion. MUSCULOSKELETAL: No joint pain or arthritis.   NEUROLOGIC: No tingling, numbness, weakness.  PSYCHIATRY: No anxiety or depression.   ROS  DRUG ALLERGIES:   Allergies  Allergen Reactions  . Bee Venom Swelling  . Codeine Diarrhea and Nausea Only  . Sulfa Antibiotics Nausea And Vomiting    VITALS:  Blood pressure 122/67, pulse (!) 56, temperature 98.6 F (37 C), temperature source Oral, resp. rate 18, height 5\' 10"  (1.778 m), weight 60.3 kg (133 lb), SpO2 100 %.  PHYSICAL EXAMINATION:  GENERAL:  51 y.o.-year-old patient lying in the bed with no acute distress.  EYES: Pupils equal, round, reactive to light and accommodation. No scleral icterus. Extraocular muscles intact.  HEENT: Head atraumatic, normocephalic. Oropharynx and nasopharynx clear.  NECK:  Supple, no jugular venous distention. No thyroid enlargement, no tenderness.  LUNGS: Normal breath sounds bilaterally, no wheezing, rales,rhonchi or crepitation. No use of accessory muscles of respiration.  CARDIOVASCULAR: S1, S2 normal. No murmurs,  rubs, or gallops.  ABDOMEN: Soft, nontender, nondistended. Bowel sounds present. No organomegaly or mass.  EXTREMITIES: No pedal edema, cyanosis, or clubbing.  NEUROLOGIC: Cranial nerves II through XII are intact. Muscle strength 5/5 in all extremities. Sensation intact. Gait not checked.  PSYCHIATRIC: The patient is alert and oriented x 3.  SKIN: No obvious rash, lesion, or ulcer.   Physical Exam LABORATORY PANEL:   CBC Recent Labs  Lab 10/30/17 1201  WBC 3.7*  HGB 14.5  HCT 44.0  PLT 234   ------------------------------------------------------------------------------------------------------------------  Chemistries  Recent Labs  Lab 10/30/17 1201  NA 138  K 4.5  CL 106  CO2 28  GLUCOSE 93  BUN 10  CREATININE 0.73  CALCIUM 9.1   ------------------------------------------------------------------------------------------------------------------  Cardiac Enzymes Recent Labs  Lab 10/30/17 1201 10/30/17 1855  TROPONINI <0.03 <0.03   ------------------------------------------------------------------------------------------------------------------  RADIOLOGY:  Dg Chest 2 View  Result Date: 10/29/2017 CLINICAL DATA:  Chest pain EXAM: CHEST  2 VIEW COMPARISON:  01/12/2016 FINDINGS: Mild hyperinflation appearing no focal pneumonia, collapse or consolidation. Normal heart size and vascularity. Healed rib fractures bilaterally. Negative for edema, effusion or pneumothorax. Trachea is midline. Postop changes of the left shoulder. IMPRESSION: Stable hyperinflation without acute chest process. Electronically Signed   By: Jerilynn Mages.  Shick M.D.   On: 10/29/2017 18:50    ASSESSMENT AND PLAN:   Active Problems:   Chest pain  This is a 51 year old male admitted for chest pain. 1.  Chest pain: Atypical; in duration and quality.     Negative cardiac biomarkers.  Monitor telemetry.    Appreciated Consult cardiology.   As he had severe CAD on  CTA coronary- suggest cath today. 2.   Bradycardia: Persistent; this may be chronic   MAP is good.  Continue to monitor.   No syncopal symptoms. 3.  Hyperlipidemia: Continue statin therapy 4.  DVT prophylaxis: Lovenox 5.  GI prophylaxis: None    All the records are reviewed and case discussed with Care Management/Social Workerr. Management plans discussed with the patient, family and they are in agreement.  CODE STATUS: Full.  TOTAL TIME TAKING CARE OF THIS PATIENT: 35 minutes.     POSSIBLE D/C IN 1-2 DAYS, DEPENDING ON CLINICAL CONDITION.   Vaughan Basta M.D on 10/30/2017   Between 7am to 6pm - Pager - [phone]  After 6pm go to [email or web address] - password [email or web address] [facility]  Office  [phone]  CC: Primary care physician; Perrin Maltese, MD  Note: This dictation was prepared with Dragon dictation along with smaller phrase technology. Any transcriptional errors that result from this process are unintentional.

## 2017-10-30 NOTE — H&P (Signed)
Jeffrey Prince is an 51 y.o. male.   Chief Complaint: Chest pain HPI: The patient with past medical history of hemorrhoids as well as abnormal weight loss presents emergency department complaining of chest pain. The patient states that his pain is sharp and left-sided.  It does not radiate.  The pain is been there all day.  It is not particularly worse with exertion although he does admit to some shortness of breath with exertion.  Cardiac enzymes have been negative but the patient states that he has known coronary artery disease from a stress test, the results of which I cannot see.  Thus the emergency department staff called the hospitalist service for further evaluation.  Past Medical History:  Diagnosis Date  . Acne   . Blind left eye    Since birth  . Chorea   . Diverticulosis   . ED (erectile dysfunction)   . GERD (gastroesophageal reflux disease)   . Hemorrhoids   . Weight loss    Abnormal    Past Surgical History:  Procedure Laterality Date  . BOTOX INJECTION N/A 10/09/2016   Procedure: BOTOX INJECTION;  Surgeon: Jules Husbands, MD;  Location: [facility] ORS;  Service: General;  Laterality: N/A;  . BOTOX INJECTION N/A 07/23/2017   Procedure: BOTOX INJECTION;  Surgeon: Jules Husbands, MD;  Location: [facility] ORS;  Service: General;  Laterality: N/A;  . COLONOSCOPY     with polyp removal  . EVALUATION UNDER ANESTHESIA WITH ANAL FISSUROTOMY N/A 10/09/2016   Procedure: EXAM UNDER ANESTHESIA WITH ANAL FISSUROTOMY chemical sphincterotomy w BOtox.;  Surgeon: Jules Husbands, MD;  Location: [facility] ORS;  Service: General;  Laterality: N/A;  Lithotomy, make sure botox is in the room  . EVALUATION UNDER ANESTHESIA WITH ANAL FISSUROTOMY N/A 07/23/2017   Procedure: EXAM UNDER ANESTHESIA WITH ANAL FISSUROTOMY, botox injection;  Surgeon: Jules Husbands, MD;  Location: [facility] ORS;  Service: General;  Laterality: N/A;  . HEMORRHOID SURGERY    . ROTATOR CUFF REPAIR  1991    Family History  Problem Relation  Age of Onset  . Breast cancer Mother   . Huntington's disease Father   . Cancer Paternal Uncle        Colon  . Diabetes Son   . Cancer Maternal Grandfather   . Cancer Paternal Grandmother   . Heart disease Paternal Grandmother   . Huntington's disease Paternal Grandmother    Social History:  reports that he quit smoking about 4 months ago. His smoking use included cigarettes. He has a 10.00 pack-year smoking history. he has never used smokeless tobacco. He reports that he does not drink alcohol or use drugs.  Allergies:  Allergies  Allergen Reactions  . Bee Venom Swelling  . Codeine Diarrhea and Nausea Only  . Sulfa Antibiotics Nausea And Vomiting    Facility-Administered Medications Prior to Admission  Medication Dose Route Frequency Provider Last Rate Last Dose  . botulinum toxin Type A (BOTOX) injection 50 Units  50 Units Intramuscular Once Pabon, Diego F, MD       Medications Prior to Admission  Medication Sig Dispense Refill  . atorvastatin (LIPITOR) 20 MG tablet Take 20 mg by mouth daily.    . isosorbide dinitrate (ISORDIL) 30 MG tablet Take 30 mg by mouth daily.    . Nitroglycerin 0.4 % OINT Place 1 application rectally 2 (two) times daily. 1 Tube 1  . ranitidine (ZANTAC) 150 MG tablet Take 1 tablet (150 mg total) by mouth 2 (two) times  daily. 30 tablet 5  . dicyclomine (BENTYL) 20 MG tablet Take 1 tablet (20 mg total) by mouth 3 (three) times daily before meals. (Patient not taking: Reported on 10/30/2017) 90 tablet 2  . diltiazem 2 % GEL Apply 1 application topically 3 (three) times daily. Apply gel topically three times a day on your rectal area. (Patient not taking: Reported on 10/30/2017) 1 Package 1  . hydrocortisone (ANUSOL-HC) 2.5 % rectal cream Place 1 application rectally 3 (three) times daily. (Patient not taking: Reported on 10/30/2017) 30 g 2  . hyoscyamine (ANASPAZ) 0.125 MG TBDP disintergrating tablet Place 0.125 mg under the tongue every 4 (four) hours as  needed for cramping.     . Lidocaine HCl (LIDOCIN) 3 % GEL Apply 1 application topically 3 (three) times daily as needed (for pain.).    Marland Kitchen tadalafil (CIALIS) 10 MG tablet Take 10 mg by mouth daily as needed for erectile dysfunction.      Results for orders placed or performed during the hospital encounter of 10/29/17 (from the past 48 hour(s))  Basic metabolic panel     Status: None   Collection Time: 10/29/17  6:10 PM  Result Value Ref Range   Sodium 137 135 - 145 mmol/L   Potassium 3.9 3.5 - 5.1 mmol/L   Chloride 105 101 - 111 mmol/L   CO2 26 22 - 32 mmol/L   Glucose, Bld 86 65 - 99 mg/dL   BUN 12 6 - 20 mg/dL   Creatinine, Ser 0.99 0.61 - 1.24 mg/dL   Calcium 9.2 8.9 - 10.3 mg/dL   GFR calc non Af Amer >60 >60 mL/min   GFR calc Af Amer >60 >60 mL/min    Comment: (NOTE) The eGFR has been calculated using the CKD EPI equation. This calculation has not been validated in all clinical situations. eGFR's persistently <60 mL/min signify possible Chronic Kidney Disease.    Anion gap 6 5 - 15    Comment: Performed at [facility], [address] [place], [place] [address]  CBC     Status: None   Collection Time: 10/29/17  6:10 PM  Result Value Ref Range   WBC 4.3 3.8 - 10.6 K/uL   RBC 4.82 4.40 - 5.90 MIL/uL   Hemoglobin 14.0 13.0 - 18.0 g/dL   HCT 41.8 40.0 - 52.0 %   MCV 86.7 80.0 - 100.0 fL   MCH 29.1 26.0 - 34.0 pg   MCHC 33.6 32.0 - 36.0 g/dL   RDW 14.2 11.5 - 14.5 %   Platelets 216 150 - 440 K/uL    Comment: Performed at [facility], [address] [place], [place] [address]  Troponin I     Status: None   Collection Time: 10/29/17  6:10 PM  Result Value Ref Range   Troponin I <0.03 <0.03 ng/mL    Comment: Performed at [facility], [address] [place], [place] [address]  Troponin I     Status: None   Collection Time: 10/29/17  9:23 PM  Result Value Ref Range   Troponin I <0.03 <0.03 ng/mL    Comment: Performed at [facility], [address] [place], [place] [address]   Dg Chest 2 View  Result Date: 10/29/2017 CLINICAL DATA:  Chest pain EXAM: CHEST  2 VIEW COMPARISON:  01/12/2016 FINDINGS: Mild hyperinflation appearing no focal pneumonia, collapse or consolidation. Normal heart size and vascularity. Healed rib fractures bilaterally. Negative for edema, effusion or pneumothorax. Trachea is midline.  Postop changes of the left shoulder. IMPRESSION: Stable hyperinflation without acute chest process. Electronically Signed   By: Jerilynn Mages.  Shick M.D.   On: 10/29/2017 18:50    Review of Systems  Constitutional: Negative for chills and fever.  HENT: Negative for sore throat and tinnitus.   Eyes: Negative for blurred vision and redness.  Respiratory: Negative for cough and shortness of breath.   Cardiovascular: Positive for chest pain. Negative for palpitations, orthopnea and PND.  Gastrointestinal: Negative for abdominal pain, diarrhea, nausea and vomiting.  Genitourinary: Negative for dysuria, frequency and urgency.  Musculoskeletal: Negative for joint pain and myalgias.  Skin: Negative for rash.       No lesions  Neurological: Positive for weakness. Negative for speech change and focal weakness.  Endo/Heme/Allergies: Does not bruise/bleed easily.       No temperature intolerance  Psychiatric/Behavioral: Negative for depression and suicidal ideas.    Blood pressure 140/75, pulse (!) 45, temperature 97.6 F (36.4 C), temperature source Oral, resp. rate 18, height '5\' 10"'  (1.778 m), weight 60.3 kg (133 lb), SpO2 100 %. Physical Exam  Vitals reviewed. Constitutional: He is oriented to person, place, and time. He appears well-developed and well-nourished. No distress.  HENT:  Head: Normocephalic and atraumatic.  Mouth/Throat: Oropharynx is clear and moist.  Eyes: Conjunctivae and EOM are normal. Pupils are equal, round, and reactive to light. No scleral icterus.  Neck: Normal range of motion. Neck supple. No JVD  present. No tracheal deviation present. No thyromegaly present.  Cardiovascular: Normal rate, regular rhythm and normal heart sounds. Exam reveals no gallop and no friction rub.  No murmur heard. Respiratory: Effort normal and breath sounds normal. No respiratory distress.  GI: Soft. Bowel sounds are normal. He exhibits no distension. There is no tenderness.  Genitourinary:  Genitourinary Comments: Deferred  Musculoskeletal: Normal range of motion. He exhibits no edema.  Lymphadenopathy:    He has no cervical adenopathy.  Neurological: He is alert and oriented to person, place, and time. No cranial nerve deficit.  Skin: Skin is warm and dry. No erythema.  Psychiatric: He has a normal mood and affect. His behavior is normal. Judgment and thought content normal.     Assessment/Plan This is a 51 year old male admitted for chest pain. 1.  Chest pain: Atypical; in duration and quality.    Continue to follow cardiac biomarkers.  Monitor telemetry.  Consult cardiology. 2.  Bradycardia: Persistent; this may be chronic as the patient states that he has dealt with low heart rate in the past but vaguely recalls that his doctor had given him medication to improve his heart rate.  Unclear what the patient means by this. MAP is good.  Continue to monitor. 3.  Hyperlipidemia: Continue statin therapy 4.  DVT prophylaxis: Lovenox 5.  GI prophylaxis: None The patient is a full code.  Time spent on admission orders and patient care approximately 45 minutes  Harrie Foreman, MD 10/30/2017, 7:10 AM

## 2017-10-30 NOTE — ED Provider Notes (Signed)
[facility] Emergency Department Provider Note    First MD Initiated Contact with Patient 10/29/17 2052     (approximate)  I have reviewed the triage vital signs and the nursing notes.   HISTORY  Chief Complaint Chest Pain    HPI Jeffrey Prince is a 51 y.o. male with below list of chronic medical conditions presents to the emergency department with 7 out of 10 left-sided chest pain that is nonradiating. Patient denies any dyspnea no lower extremity pain or swelling. Patient states he was informed by his cardiologist Dr. Yancey Flemings that he had a "blockage in his heart and that this pain is consistent with when he was informed of this. Patient denies ever having a catheterization performed however does admit to having a stress test performed unsure of the date. Patient states that he is being treated medically for the "blockage in my heart". In addition patient states that he was informed that he had a "low heart rate" and that he's been prescribed medicine for that as well. Patient denies any dizziness no nausea or vomiting. Patient denies any diaphoresis.   Past Medical History:  Diagnosis Date  . Acne   . Blind left eye    Since birth  . Chorea   . Diverticulosis   . ED (erectile dysfunction)   . GERD (gastroesophageal reflux disease)   . Hemorrhoids   . Weight loss    Abnormal    Patient Active Problem List   Diagnosis Date Noted  . Anal fissure   . Family history of Huntington's chorea 07/01/2017  . Unintentional weight loss 05/16/2015  . Diarrhea 05/16/2015  . GERD (gastroesophageal reflux disease) 05/16/2015  . Diffuse abdominal pain 05/16/2015  . Dyslipidemia (high LDL; low HDL) 01/25/2015  . Decreased visual acuity 01/24/2015  . Erectile dysfunction 01/24/2015  . Current every day smoker 01/24/2015  . 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 Hemorrhoids 03/16/2014    Past Surgical History:  Procedure Laterality Date  . BOTOX  INJECTION N/A 10/09/2016   Procedure: BOTOX INJECTION;  Surgeon: Jules Husbands, MD;  Location: [facility] ORS;  Service: General;  Laterality: N/A;  . BOTOX INJECTION N/A 07/23/2017   Procedure: BOTOX INJECTION;  Surgeon: Jules Husbands, MD;  Location: [facility] ORS;  Service: General;  Laterality: N/A;  . COLONOSCOPY     with polyp removal  . EVALUATION UNDER ANESTHESIA WITH ANAL FISSUROTOMY N/A 10/09/2016   Procedure: EXAM UNDER ANESTHESIA WITH ANAL FISSUROTOMY chemical sphincterotomy w BOtox.;  Surgeon: Jules Husbands, MD;  Location: [facility] ORS;  Service: General;  Laterality: N/A;  Lithotomy, make sure botox is in the room  . EVALUATION UNDER ANESTHESIA WITH ANAL FISSUROTOMY N/A 07/23/2017   Procedure: EXAM UNDER ANESTHESIA WITH ANAL FISSUROTOMY, botox injection;  Surgeon: Jules Husbands, MD;  Location: [facility] ORS;  Service: General;  Laterality: N/A;  . HEMORRHOID SURGERY    . ROTATOR CUFF REPAIR  1991    Prior to Admission medications   Medication Sig Start Date End Date Taking? Authorizing Provider  dicyclomine (BENTYL) 20 MG tablet Take 1 tablet (20 mg total) by mouth 3 (three) times daily before meals. 06/23/17   Lucilla Lame, MD  diltiazem 2 % GEL Apply 1 application topically 3 (three) times daily. Apply gel topically three times a day on your rectal area. 04/21/17   Pabon, [place], MD  hydrocortisone (ANUSOL-HC) 2.5 % rectal cream Place 1 application rectally 3 (three) times daily. 08/06/17   Pabon, [place],  MD  hyoscyamine (ANASPAZ) 0.125 MG TBDP disintergrating tablet Place 0.125 mg under the tongue every 4 (four) hours as needed for cramping.     [provider]  Lidocaine HCl (LIDOCIN) 3 % GEL Apply 1 application topically 3 (three) times daily as needed (for pain.).    [provider]  Nitroglycerin 0.4 % OINT Place 1 application rectally 2 (two) times daily. 06/30/17   Pabon, Marjory Lies, MD  ranitidine (ZANTAC) 150 MG tablet Take 1 tablet (150 mg total) by mouth 2 (two) times daily.  12/14/15   Funches, Adriana Mccallum, MD  tadalafil (CIALIS) 10 MG tablet Take 10 mg by mouth daily as needed for erectile dysfunction.    [provider]    Allergies Bee venom; Codeine; and Sulfa antibiotics  Family History  Problem Relation Age of Onset  . Breast cancer Mother   . Huntington's disease Father   . Cancer Paternal Uncle        Colon  . Diabetes Son   . Cancer Maternal Grandfather   . Cancer Paternal Grandmother   . Heart disease Paternal Grandmother   . Huntington's disease Paternal Grandmother     Social History Social History   Tobacco Use  . Smoking status: Former Smoker    Packs/day: 0.50    Years: 20.00    Pack years: 10.00    Types: Cigarettes    Last attempt to quit: 06/16/2017    Years since quitting: 0.3  . Smokeless tobacco: Never Used  Substance Use Topics  . Alcohol use: No    Alcohol/week: 0.0 oz  . Drug use: No    Review of Systems Constitutional: No fever/chills Eyes: No visual changes. ENT: No sore throat. Cardiovascular: Positive for chest pain. Respiratory: Denies shortness of breath. Gastrointestinal: No abdominal pain.  No nausea, no vomiting.  No diarrhea.  No constipation. Genitourinary: Negative for dysuria. Musculoskeletal: Negative for neck pain.  Negative for back pain. Integumentary: Negative for rash. Neurological: Negative for headaches, focal weakness or numbness.   ____________________________________________   PHYSICAL EXAM:  VITAL SIGNS: ED Triage Vitals  Enc Vitals Group     BP 10/29/17 1814 118/76     Pulse Rate 10/29/17 1814 (!) 54     Resp 10/29/17 1814 16     Temp 10/29/17 1814 98.8 F (37.1 C)     Temp Source 10/29/17 1814 Oral     SpO2 10/29/17 1814 100 %     Weight 10/29/17 1815 65.8 kg (145 lb)     Height --      Head Circumference --      Peak Flow --      Pain Score 10/29/17 1814 7     Pain Loc --      Pain Edu? --      Excl. in [place]? --     Constitutional: Alert and oriented. Well  appearing and in no acute distress. Eyes: Conjunctivae are normal. PERRL. EOMI. Head: Atraumatic. Mouth/Throat: Mucous membranes are moist. Oropharynx non-erythematous. Neck: No stridor.   Cardiovascular: Normal rate, regular rhythm. Good peripheral circulation. Grossly normal heart sounds. Respiratory: Normal respiratory effort.  No retractions. Lungs CTAB. Gastrointestinal: Soft and nontender. No distention.  Musculoskeletal: No lower extremity tenderness nor edema. No gross deformities of extremities. Neurologic:  Normal speech and language. No gross focal neurologic deficits are appreciated.  Skin:  Skin is warm, dry and intact. No rash noted. Psychiatric: Mood and affect are normal. Speech and behavior are normal.  ____________________________________________  LABS (all labs ordered are listed, but only abnormal results are displayed)  Labs Reviewed  BASIC METABOLIC PANEL  CBC  TROPONIN I  TROPONIN I   ____________________________________________  EKG  ED ECG REPORT I, [place] N BROWN, the attending physician, personally viewed and interpreted this ECG.   Date: 10/30/2017  EKG Time: 6:07 PM  Rate: 52  Rhythm: Sinus bradycardia  Axis: Normal  Intervals: Normal  ST&T Change: None  ____________________________________________  RADIOLOGY I, [place] N BROWN, personally viewed and evaluated these images (plain radiographs) as part of my medical decision making, as well as reviewing the written report by the radiologist.  Dg Chest 2 View  Result Date: 10/29/2017 CLINICAL DATA:  Chest pain EXAM: CHEST  2 VIEW COMPARISON:  01/12/2016 FINDINGS: Mild hyperinflation appearing no focal pneumonia, collapse or consolidation. Normal heart size and vascularity. Healed rib fractures bilaterally. Negative for edema, effusion or pneumothorax. Trachea is midline. Postop changes of the left shoulder. IMPRESSION: Stable hyperinflation without acute chest process. Electronically Signed    By: Jerilynn Mages.  Shick M.D.   On: 10/29/2017 18:50     Procedures   ____________________________________________   INITIAL IMPRESSION / ASSESSMENT AND PLAN / ED COURSE  As part of my medical decision making, I reviewed the following data within the electronic MEDICAL RECORD NUMBER 51 year old male presenting with above stated history of chest pain. EKG revealed no evidence of ischemia or infarction however patient noted to be persistently bradycardic with heart rate persistently in the 40s while in the emergency department. Patient admits to ongoing chest pain. Laboratory data unremarkable including troponin 1. Patient discussed with Dr. Hedda Slade admission for further evaluation and management of persistent chest pain and bradycardia ____________________________________________  FINAL CLINICAL IMPRESSION(S) / ED DIAGNOSES  Final diagnoses:  Chest pain, unspecified type  Bradycardia     MEDICATIONS GIVEN DURING THIS VISIT:  Medications - No data to display   ED Discharge Orders    None       Note:  This document was prepared using Dragon voice recognition software and may include unintentional dictation errors.    Gregor Hams, MD 10/30/17 [phone]

## 2017-10-31 ENCOUNTER — Inpatient Hospital Stay (HOSPITAL_COMMUNITY): Payer: BLUE CROSS/BLUE SHIELD

## 2017-10-31 ENCOUNTER — Inpatient Hospital Stay (HOSPITAL_COMMUNITY)
Admission: AD | Admit: 2017-10-31 | Discharge: 2017-11-08 | DRG: 236 | Disposition: A | Discharge status: Home Health | Payer: BLUE CROSS/BLUE SHIELD | Source: Other Acute Inpatient Hospital | Attending: Cardiothoracic Surgery | Admitting: Cardiothoracic Surgery

## 2017-10-31 ENCOUNTER — Encounter: Payer: Self-pay | Admitting: Cardiovascular Disease

## 2017-10-31 ENCOUNTER — Other Ambulatory Visit: Payer: Self-pay | Admitting: *Deleted

## 2017-10-31 ENCOUNTER — Other Ambulatory Visit: Payer: Self-pay

## 2017-10-31 DIAGNOSIS — Z87891 Personal history of nicotine dependence: Secondary | ICD-10-CM

## 2017-10-31 DIAGNOSIS — Z01811 Encounter for preprocedural respiratory examination: Secondary | ICD-10-CM

## 2017-10-31 DIAGNOSIS — D696 Thrombocytopenia, unspecified: Secondary | ICD-10-CM | POA: Diagnosis present

## 2017-10-31 DIAGNOSIS — Z8249 Family history of ischemic heart disease and other diseases of the circulatory system: Secondary | ICD-10-CM | POA: Diagnosis not present

## 2017-10-31 DIAGNOSIS — I249 Acute ischemic heart disease, unspecified: Principal | ICD-10-CM | POA: Diagnosis present

## 2017-10-31 DIAGNOSIS — D62 Acute posthemorrhagic anemia: Secondary | ICD-10-CM | POA: Diagnosis not present

## 2017-10-31 DIAGNOSIS — E785 Hyperlipidemia, unspecified: Secondary | ICD-10-CM | POA: Diagnosis present

## 2017-10-31 DIAGNOSIS — H5462 Unqualified visual loss, left eye, normal vision right eye: Secondary | ICD-10-CM | POA: Diagnosis present

## 2017-10-31 DIAGNOSIS — K219 Gastro-esophageal reflux disease without esophagitis: Secondary | ICD-10-CM | POA: Diagnosis present

## 2017-10-31 DIAGNOSIS — Z09 Encounter for follow-up examination after completed treatment for conditions other than malignant neoplasm: Secondary | ICD-10-CM

## 2017-10-31 DIAGNOSIS — Z01810 Encounter for preprocedural cardiovascular examination: Secondary | ICD-10-CM | POA: Diagnosis not present

## 2017-10-31 DIAGNOSIS — I2510 Atherosclerotic heart disease of native coronary artery without angina pectoris: Secondary | ICD-10-CM | POA: Diagnosis present

## 2017-10-31 DIAGNOSIS — G255 Other chorea: Secondary | ICD-10-CM | POA: Diagnosis present

## 2017-10-31 DIAGNOSIS — I361 Nonrheumatic tricuspid (valve) insufficiency: Secondary | ICD-10-CM

## 2017-10-31 DIAGNOSIS — I25110 Atherosclerotic heart disease of native coronary artery with unstable angina pectoris: Secondary | ICD-10-CM

## 2017-10-31 DIAGNOSIS — Z951 Presence of aortocoronary bypass graft: Secondary | ICD-10-CM

## 2017-10-31 DIAGNOSIS — E8770 Fluid overload, unspecified: Secondary | ICD-10-CM | POA: Diagnosis not present

## 2017-10-31 DIAGNOSIS — Z833 Family history of diabetes mellitus: Secondary | ICD-10-CM

## 2017-10-31 DIAGNOSIS — R079 Chest pain, unspecified: Secondary | ICD-10-CM | POA: Diagnosis present

## 2017-10-31 LAB — ECHOCARDIOGRAM COMPLETE
AVLVOTPG: 7 mmHg
CHL CUP MV DEC (S): 173
EERAT: 4.43
EWDT: 173 ms
FS: 32 % (ref 28–44)
Height: 70 in
IV/PV OW: 0.95
LA diam end sys: 35 mm
LA diam index: 1.99 cm/m2
LA vol A4C: 37.5 ml
LA vol: 42.5 mL
LASIZE: 35 mm
LAVOLIN: 24.1 mL/m2
LV E/e' medial: 4.43
LV TDI E'LATERAL: 17.4
LV e' LATERAL: 17.4 cm/s
LVEEAVG: 4.43
LVOT SV: 91 mL
LVOT VTI: 28.9 cm
LVOT area: 3.14 cm2
LVOT diameter: 20 mm
LVOTPV: 132 cm/s
Lateral S' vel: 11.6 cm/s
MV Peak grad: 2 mmHg
MV pk A vel: 69.8 m/s
MVPKEVEL: 77.1 m/s
PW: 7.73 mm — AB (ref 0.6–1.1)
TAPSE: 26.4 mm
TDI e' medial: 9.79
Weight: 2128 oz

## 2017-10-31 LAB — COMPREHENSIVE METABOLIC PANEL
ALK PHOS: 54 U/L (ref 38–126)
ALT: 22 U/L (ref 17–63)
AST: 24 U/L (ref 15–41)
Albumin: 4.2 g/dL (ref 3.5–5.0)
Anion gap: 7 (ref 5–15)
BUN: 9 mg/dL (ref 6–20)
CHLORIDE: 108 mmol/L (ref 101–111)
CO2: 24 mmol/L (ref 22–32)
CREATININE: 0.89 mg/dL (ref 0.61–1.24)
Calcium: 9.4 mg/dL (ref 8.9–10.3)
GFR calc Af Amer: >60 mL/min (ref >60)
Glucose, Bld: 83 mg/dL (ref 65–99)
Potassium: 3.9 mmol/L (ref 3.5–5.1)
Sodium: 139 mmol/L (ref 135–145)
Total Bilirubin: 1.3 mg/dL — ABNORMAL HIGH (ref 0.3–1.2)
Total Protein: 7.7 g/dL (ref 6.5–8.1)

## 2017-10-31 LAB — PULMONARY FUNCTION TEST
DL/VA % pred: 86 %
DL/VA: 4.02 ml/min/mmHg/L
DLCO cor % pred: 68 %
DLCO cor: 22.05 ml/min/mmHg
DLCO unc % pred: 68 %
DLCO unc: 21.99 ml/min/mmHg
FEF 25-75 Post: 3.4 L/sec
FEF 25-75 Pre: 3 L/sec
FEF2575-%Change-Post: 13 %
FEF2575-%Pred-Post: 100 %
FEF2575-%Pred-Pre: 88 %
FEV1-%Change-Post: 3 %
FEV1-%Pred-Post: 92 %
FEV1-%Pred-Pre: 89 %
FEV1-Post: 3.14 L
FEV1-Pre: 3.03 L
FEV1FVC-%Change-Post: 11 %
FEV1FVC-%Pred-Pre: 100 %
FEV6-%Change-Post: -6 %
FEV6-%Pred-Post: 85 %
FEV6-%Pred-Pre: 91 %
FEV6-Post: 3.52 L
FEV6-Pre: 3.77 L
FEV6FVC-%Pred-Post: 103 %
FEV6FVC-%Pred-Pre: 103 %
FVC-%Change-Post: -6 %
FVC-%Pred-Post: 83 %
FVC-%Pred-Pre: 89 %
FVC-Post: 3.52 L
FVC-Pre: 3.77 L
Post FEV1/FVC ratio: 89 %
Post FEV6/FVC ratio: 100 %
Pre FEV1/FVC ratio: 80 %
Pre FEV6/FVC Ratio: 100 %
RV % pred: 112 %
RV: 2.27 L
TLC % pred: 89 %
TLC: 6.23 L

## 2017-10-31 LAB — CBC WITH DIFFERENTIAL/PLATELET
Basophils Absolute: 0 10*3/uL (ref 0.0–0.1)
Basophils Relative: 0 %
Eosinophils Absolute: 0.2 10*3/uL (ref 0.0–0.7)
Eosinophils Relative: 3 %
HEMATOCRIT: 44.1 % (ref 39.0–52.0)
Hemoglobin: 14.6 g/dL (ref 13.0–17.0)
LYMPHS PCT: 22 %
Lymphs Abs: 1.1 10*3/uL (ref 0.7–4.0)
MCH: 28.9 pg (ref 26.0–34.0)
MCHC: 33.1 g/dL (ref 30.0–36.0)
MCV: 87.3 fL (ref 78.0–100.0)
MONO ABS: 0.4 10*3/uL (ref 0.1–1.0)
MONOS PCT: 8 %
NEUTROS ABS: 3.4 10*3/uL (ref 1.7–7.7)
Neutrophils Relative %: 67 %
Platelets: 222 10*3/uL (ref 150–400)
RBC: 5.05 MIL/uL (ref 4.22–5.81)
RDW: 13.5 % (ref 11.5–15.5)
WBC: 5.1 10*3/uL (ref 4.0–10.5)

## 2017-10-31 LAB — HEMOGLOBIN A1C
Hgb A1c MFr Bld: 4.9 % (ref 4.8–5.6)
Mean Plasma Glucose: 93.93 mg/dL

## 2017-10-31 MED ORDER — ATORVASTATIN CALCIUM 20 MG PO TABS
20.0000 mg | ORAL_TABLET | Freq: Every day | ORAL | Status: DC
  Administered 2017-10-31 – 2017-11-04 (×4): 20 mg via ORAL
  Filled 2017-10-31 (×4): qty 1

## 2017-10-31 MED ORDER — ATORVASTATIN CALCIUM 40 MG PO TABS: 20.0000 mg | ORAL_TABLET | Freq: Every day | ORAL | 0 refills | Status: DC

## 2017-10-31 MED ORDER — ALBUTEROL SULFATE (2.5 MG/3ML) 0.083% IN NEBU
2.5000 mg | INHALATION_SOLUTION | Freq: Once | RESPIRATORY_TRACT | Status: AC
  Administered 2017-10-31: 2.5 mg via RESPIRATORY_TRACT

## 2017-10-31 MED ORDER — NITROGLYCERIN 2 % TD OINT
1.0000 [in_us] | TOPICAL_OINTMENT | Freq: Four times a day (QID) | TRANSDERMAL | Status: DC
  Administered 2017-10-31 – 2017-11-02 (×8): 1 [in_us] via TOPICAL
  Filled 2017-10-31: qty 30

## 2017-10-31 MED ORDER — ASPIRIN 81 MG PO CHEW
324.0000 mg | CHEWABLE_TABLET | ORAL | Status: AC
  Administered 2017-10-31: 324 mg via ORAL
  Filled 2017-10-31: qty 4

## 2017-10-31 MED ORDER — ONDANSETRON HCL 4 MG/2ML IJ SOLN: 4.0000 mg | Freq: Four times a day (QID) | INTRAMUSCULAR | Status: DC | PRN

## 2017-10-31 MED ORDER — FAMOTIDINE 20 MG PO TABS
10.0000 mg | ORAL_TABLET | Freq: Every day | ORAL | Status: DC
  Administered 2017-10-31 – 2017-11-02 (×3): 10 mg via ORAL
  Filled 2017-10-31 (×3): qty 1

## 2017-10-31 MED ORDER — ENSURE ENLIVE PO LIQD
237.0000 mL | Freq: Two times a day (BID) | ORAL | Status: DC
  Administered 2017-10-31 – 2017-11-04 (×4): 237 mL via ORAL

## 2017-10-31 MED ORDER — ASPIRIN 300 MG RE SUPP: 300.0000 mg | RECTAL | Status: AC

## 2017-10-31 MED ORDER — NITROGLYCERIN 0.4 MG SL SUBL: 0.4000 mg | SUBLINGUAL_TABLET | SUBLINGUAL | Status: DC | PRN

## 2017-10-31 MED ORDER — ACETAMINOPHEN 325 MG PO TABS
650.0000 mg | ORAL_TABLET | ORAL | Status: DC | PRN
  Administered 2017-10-31: 650 mg via ORAL
  Filled 2017-10-31: qty 2

## 2017-10-31 MED ORDER — ASPIRIN EC 81 MG PO TBEC
81.0000 mg | DELAYED_RELEASE_TABLET | Freq: Every day | ORAL | Status: DC
  Administered 2017-11-01 – 2017-11-02 (×2): 81 mg via ORAL
  Filled 2017-10-31 (×2): qty 1

## 2017-10-31 MED ORDER — HYOSCYAMINE SULFATE 0.125 MG PO TBDP
0.1250 mg | ORAL_TABLET | ORAL | Status: DC | PRN
  Filled 2017-10-31: qty 1

## 2017-10-31 MED ORDER — ENOXAPARIN SODIUM 60 MG/0.6ML ~~LOC~~ SOLN
1.0000 mg/kg | Freq: Two times a day (BID) | SUBCUTANEOUS | Status: DC
  Administered 2017-10-31 – 2017-11-02 (×5): 60 mg via SUBCUTANEOUS
  Filled 2017-10-31 (×6): qty 0.6

## 2017-10-31 MED ORDER — LIDOCAINE HCL 2 % EX GEL
1.0000 "application " | Freq: Three times a day (TID) | CUTANEOUS | Status: DC | PRN
  Administered 2017-11-02: 1 via TOPICAL
  Filled 2017-10-31: qty 5

## 2017-10-31 NOTE — Discharge Summary (Signed)
[facility] NAME: Jeffrey Prince    MR#:  [id number]  DATE OF BIRTH:  Dec 02, 1965  DATE OF ADMISSION:  10/29/2017 ADMITTING PHYSICIAN: Harrie Foreman, MD  DATE OF DISCHARGE: 10/31/2017   PRIMARY CARE PHYSICIAN: Perrin Maltese, MD    ADMISSION DIAGNOSIS:  Bradycardia [R00.1] Chest pain, unspecified type [R07.9]  DISCHARGE DIAGNOSIS:  Active Problems:   Chest pain  CAD  SECONDARY DIAGNOSIS:   Past Medical History:  Diagnosis Date  . Acne   . Blind left eye    Since birth  . Chorea   . Diverticulosis   . ED (erectile dysfunction)   . GERD (gastroesophageal reflux disease)   . Hemorrhoids   . Weight loss    Abnormal    HOSPITAL COURSE:    This is a 51 year old male admitted for chest pain. 1. CAD- Chest pain: Atypical; induration and quality.   Negative cardiac biomarkers. Monitor telemetry.   Appreciated Consult cardiology.   As he had severe CAD on CTA coronary- suggest cath .   Found severe tripple vessel CAD- transfer at [facility] for CABG. 2. Bradycardia: Persistent; this may be chronic   MAPis good. Continue to monitor.   No syncopal symptoms. 3. Hyperlipidemia: Continue statin therapy 4. DVT prophylaxis: Lovenox 5. GI prophylaxis: None     DISCHARGE CONDITIONS:   Stable.  CONSULTS OBTAINED:  Treatment Team:  Dionisio David, MD Ivin Poot, MD  DRUG ALLERGIES:   Allergies  Allergen Reactions  . Bee Venom Swelling  . Codeine Diarrhea and Nausea Only  . Sulfa Antibiotics Nausea And Vomiting    DISCHARGE MEDICATIONS:   Allergies as of 10/31/2017      Reactions   Bee Venom Swelling   Codeine Diarrhea, Nausea Only   Sulfa Antibiotics Nausea And Vomiting      Medication List    STOP taking these medications   dicyclomine 20 MG tablet Commonly known as:  BENTYL   diltiazem 2 % Gel   Nitroglycerin 0.4 % Oint     TAKE these medications    atorvastatin 40 MG tablet Commonly known as:  LIPITOR Take 0.5 tablets (20 mg total) by mouth daily. What changed:  medication strength   hydrocortisone 2.5 % rectal cream Commonly known as:  ANUSOL-HC Place 1 application rectally 3 (three) times daily.   hyoscyamine 0.125 MG Tbdp disintergrating tablet Commonly known as:  ANASPAZ Place 0.125 mg under the tongue every 4 (four) hours as needed for cramping.   isosorbide dinitrate 30 MG tablet Commonly known as:  ISORDIL Take 30 mg by mouth daily.   LIDOCIN 3 % Gel Generic drug:  Lidocaine HCl Apply 1 application topically 3 (three) times daily as needed (for pain.).   ranitidine 150 MG tablet Commonly known as:  ZANTAC Take 1 tablet (150 mg total) by mouth 2 (two) times daily.   tadalafil 10 MG tablet Commonly known as:  CIALIS Take 10 mg by mouth daily as needed for erectile dysfunction.        DISCHARGE INSTRUCTIONS:    Follow with Dr.Khan in 1-2 weeks.  If you experience worsening of your admission symptoms, develop shortness of breath, life threatening emergency, suicidal or homicidal thoughts you must seek medical attention immediately by calling 911 or calling your MD immediately  if symptoms less severe.  You Must read complete instructions/literature along with all the possible adverse reactions/side effects for all the Medicines you take and that  have been prescribed to you. Take any new Medicines after you have completely understood and accept all the possible adverse reactions/side effects.   Please note  You were cared for by a hospitalist during your hospital stay. If you have any questions about your discharge medications or the care you received while you were in the hospital after you are discharged, you can call the unit and asked to speak with the hospitalist on call if the hospitalist that took care of you is not available. Once you are discharged, your primary care physician will handle any further  medical issues. Please note that NO REFILLS for any discharge medications will be authorized once you are discharged, as it is imperative that you return to your primary care physician (or establish a relationship with a primary care physician if you do not have one) for your aftercare needs so that they can reassess your need for medications and monitor your lab values.    Today   CHIEF COMPLAINT:   Chief Complaint  Patient presents with  . Chest Pain    HISTORY OF PRESENT ILLNESS:  Jeffrey Prince  is a 51 y.o. male with a known history of hemorrhoids as well as abnormal weight loss presents emergency department complaining of chest pain. The patient states that his pain is sharp and left-sided.  It does not radiate.  The pain is been there all day.  It is not particularly worse with exertion although he does admit to some shortness of breath with exertion.  Cardiac enzymes have been negative but the patient states that he has known coronary artery disease from a stress test, the results of which I cannot see.  Thus the emergency department staff called the hospitalist service for further evaluation.    VITAL SIGNS:  Blood pressure 119/73, pulse 67, temperature 98.3 F (36.8 C), resp. rate 18, height 5\' 10"  (1.778 m), weight 60.3 kg (133 lb), SpO2 100 %.  I/O:    Intake/Output Summary (Last 24 hours) at 10/31/2017 0743 Last data filed at 10/30/2017 1910 Gross per 24 hour  Intake 420.3 ml  Output -  Net 420.3 ml    PHYSICAL EXAMINATION:  GENERAL:  51 y.o.-year-old patient lying in the bed with no acute distress.  EYES: Pupils equal, round, reactive to light and accommodation. No scleral icterus. Extraocular muscles intact.  HEENT: Head atraumatic, normocephalic. Oropharynx and nasopharynx clear.  NECK:  Supple, no jugular venous distention. No thyroid enlargement, no tenderness.  LUNGS: Normal breath sounds bilaterally, no wheezing, rales,rhonchi or crepitation. No use of  accessory muscles of respiration.  CARDIOVASCULAR: S1, S2 normal. No murmurs, rubs, or gallops.  ABDOMEN: Soft, non-tender, non-distended. Bowel sounds present. No organomegaly or mass.  EXTREMITIES: No pedal edema, cyanosis, or clubbing.  NEUROLOGIC: Cranial nerves II through XII are intact. Muscle strength 5/5 in all extremities. Sensation intact. Gait not checked.  PSYCHIATRIC: The patient is alert and oriented x 3.  SKIN: No obvious rash, lesion, or ulcer.   DATA REVIEW:   CBC Recent Labs  Lab 10/30/17 1201  WBC 3.7*  HGB 14.5  HCT 44.0  PLT 234    Chemistries  Recent Labs  Lab 10/30/17 1201  NA 138  K 4.5  CL 106  CO2 28  GLUCOSE 93  BUN 10  CREATININE 0.73  CALCIUM 9.1    Cardiac Enzymes Recent Labs  Lab 10/30/17 1855  TROPONINI <0.03    Microbiology Results  No results found for this or any previous visit.  RADIOLOGY:  Dg Chest 2 View  Result Date: 10/29/2017 CLINICAL DATA:  Chest pain EXAM: CHEST  2 VIEW COMPARISON:  01/12/2016 FINDINGS: Mild hyperinflation appearing no focal pneumonia, collapse or consolidation. Normal heart size and vascularity. Healed rib fractures bilaterally. Negative for edema, effusion or pneumothorax. Trachea is midline. Postop changes of the left shoulder. IMPRESSION: Stable hyperinflation without acute chest process. Electronically Signed   By: Jerilynn Mages.  Shick M.D.   On: 10/29/2017 18:50    EKG:   Orders placed or performed during the hospital encounter of 10/29/17  . EKG 12-Lead  . EKG 12-Lead  . ED EKG within 10 minutes  . ED EKG within 10 minutes      Management plans discussed with the patient, family and they are in agreement.  CODE STATUS:     Code Status Orders  (From admission, onward)        Start     Ordered   10/30/17 0555  Full code  Continuous     10/30/17 0554    Code Status History    Date Active Date Inactive Code Status Order ID Comments User Context   This patient has a current code status but  no historical code status.      TOTAL TIME TAKING CARE OF THIS PATIENT: 35 minutes.    Vaughan Basta M.D on 10/31/2017 at 7:43 AM  Between 7am to 6pm - Pager - [phone]  After 6pm go to [email or web address] - password [email or web address] [facility]  Office  [phone]  CC: Primary care physician; Perrin Maltese, MD   Note: This dictation was prepared with Dragon dictation along with smaller phrase technology. Any transcriptional errors that result from this process are unintentional.

## 2017-10-31 NOTE — H&P (Signed)
Jeffrey Prince       [place] [address]             [phone]        Jeffrey Prince [place] Medical Record #[id number] Date of Birth: 07/21/1966  Referring: No ref. provider found Primary Care: Perrin Maltese, MD Primary Cardiologist:KHAN,SHAUKAT A, MD  Chief Complaint:  Chest pain  History of Present Illness:     This is a 51 year old male patient with a past medical history of dyslipidemia, tobacco abuse, family history of huntington's disease (pt unsure if he has this) and GERD who presented to the emergency department with a complaint of chest pain.  The patient states that his chest pain is sharp and left-sided.  It does not radiate.  He does have shortness of breath with exertion.  He has had some fatigue and weight loss over the last couple months.  Cardiac enzymes have been negative.  A cardiac catheterization was performed which showed proximal LAD disease which is 90% stenosed, ostial first diagonal lesion which is 70% stenosed, mid RCA lesion which is 95% stenosed, Ost 1st Mrg lesion which  is 70% stenosed and the distal left main into ostial LAD lesion which is 70% stenosed.    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  He has had several issues with diarrhea, diverticulosis, hemorrhoids, and anal fissures which he has been treated.  He has noted increased fatigue and weight loss over the last several months.   Current Activity/ Functional Status: Patient was independent with mobility/ambulation, transfers, ADL's, IADL's.   Zubrod Score: At the time of surgery this patient's most appropriate activity status/level should be described as: []     0    Normal activity, no symptoms []     1    Restricted in physical strenuous activity but ambulatory, able to do out light work [x]     2    Ambulatory and capable of self care, unable to do work activities, up and about                 more than 50%  Of the time                             []     3    Only limited self care, in bed greater than 50% of waking hours []     4    Completely disabled, no self care, confined to bed or chair []     5    Moribund  Past Medical History:  Diagnosis Date  . Acne   . Blind left eye    Since birth  . Chorea   . Diverticulosis   . ED (erectile dysfunction)   . GERD (gastroesophageal reflux disease)   . Hemorrhoids   . Weight loss    Abnormal    Past Surgical History:  Procedure Laterality Date  . BOTOX INJECTION N/A 10/09/2016   Procedure: BOTOX INJECTION;  Surgeon: Jules Husbands, MD;  Location: [facility] ORS;  Service: General;  Laterality: N/A;  . BOTOX INJECTION N/A 07/23/2017   Procedure: BOTOX INJECTION;  Surgeon: Jules Husbands, MD;  Location: [facility] ORS;  Service: General;  Laterality: N/A;  . COLONOSCOPY     with polyp removal  . EVALUATION UNDER ANESTHESIA WITH ANAL FISSUROTOMY N/A 10/09/2016   Procedure: EXAM UNDER ANESTHESIA WITH ANAL  FISSUROTOMY chemical sphincterotomy w BOtox.;  Surgeon: Jules Husbands, MD;  Location: [facility] ORS;  Service: General;  Laterality: N/A;  Lithotomy, make sure botox is in the room  . EVALUATION UNDER ANESTHESIA WITH ANAL FISSUROTOMY N/A 07/23/2017   Procedure: EXAM UNDER ANESTHESIA WITH ANAL FISSUROTOMY, botox injection;  Surgeon: Jules Husbands, MD;  Location: [facility] ORS;  Service: General;  Laterality: N/A;  . HEMORRHOID SURGERY    . LEFT HEART CATH AND CORONARY ANGIOGRAPHY Right 10/30/2017   Procedure: LEFT HEART CATH AND CORONARY ANGIOGRAPHY;  Surgeon: Dionisio David, MD;  Location: [facility];  Service: Cardiovascular;  Laterality: Right;  . ROTATOR CUFF REPAIR  1991    Social History   Tobacco Use  Smoking Status Former Smoker  . Packs/day: 0.50  . Years: 20.00  . Pack years: 10.00  . Types: Cigarettes  . Last attempt to quit: 06/16/2017  . Years since quitting: 0.3  Smokeless Tobacco Never Used    Social History   Substance and Sexual Activity  Alcohol Use No    . Alcohol/week: 0.0 oz    Social History   Socioeconomic History  . Marital status: Single    Spouse name: Not on file  . Number of children: Not on file  . Years of education: Not on file  . Highest education level: Not on file  Social Needs  . Financial resource strain: Not on file  . Food insecurity - worry: Not on file  . Food insecurity - inability: Not on file  . Transportation needs - medical: Not on file  . Transportation needs - non-medical: Not on file  Occupational History  . Not on file  Tobacco Use  . Smoking status: Former Smoker    Packs/day: 0.50    Years: 20.00    Pack years: 10.00    Types: Cigarettes    Last attempt to quit: 06/16/2017    Years since quitting: 0.3  . Smokeless tobacco: Never Used  Substance and Sexual Activity  . Alcohol use: No    Alcohol/week: 0.0 oz  . Drug use: No  . Sexual activity: Not on file  Other Topics Concern  . Not on file  Social History Narrative  . Not on file    Allergies  Allergen Reactions  . Bee Venom Swelling  . Codeine Diarrhea and Nausea Only  . Sulfa Antibiotics Nausea And Vomiting    Current Facility-Administered Medications  Medication Dose Route Frequency Provider Last Rate Last Dose  . acetaminophen (TYLENOL) tablet 650 mg  650 mg Oral Q4H PRN Conte, Tessa N, PA-C      . albuterol (PROVENTIL) (2.5 MG/3ML) 0.083% nebulizer solution 2.5 mg  2.5 mg Nebulization Once Grace Isaac, MD      . Derrill Memo ON 11/01/2017] aspirin EC tablet 81 mg  81 mg Oral Daily Conte, Tessa N, PA-C      . enoxaparin (LOVENOX) injection 60 mg  1 mg/kg Subcutaneous Q12H Conte, Tessa N, PA-C      . nitroGLYCERIN (NITROSTAT) SL tablet 0.4 mg  0.4 mg Sublingual Q5 Min x 3 PRN Conte, Tessa N, PA-C      . ondansetron (ZOFRAN) injection 4 mg  4 mg Intravenous Q6H PRN Harriet Pho, Tessa N, PA-C        Medications Prior to Admission  Medication Sig Dispense Refill Last Dose  . atorvastatin (LIPITOR) 40 MG tablet Take 0.5 tablets (20  mg total) by mouth daily. 30 tablet 0   . hydrocortisone (  ANUSOL-HC) 2.5 % rectal cream Place 1 application rectally 3 (three) times daily. (Patient not taking: Reported on 10/30/2017) 30 g 2 Not Taking at Unknown time  . hyoscyamine (ANASPAZ) 0.125 MG TBDP disintergrating tablet Place 0.125 mg under the tongue every 4 (four) hours as needed for cramping.    Not Taking at Unknown time  . isosorbide dinitrate (ISORDIL) 30 MG tablet Take 30 mg by mouth daily.   10/29/2017 at Unknown time  . Lidocaine HCl (LIDOCIN) 3 % GEL Apply 1 application topically 3 (three) times daily as needed (for pain.).   Not Taking at Unknown time  . ranitidine (ZANTAC) 150 MG tablet Take 1 tablet (150 mg total) by mouth 2 (two) times daily. 30 tablet 5 10/29/2017 at Unknown time  . tadalafil (CIALIS) 10 MG tablet Take 10 mg by mouth daily as needed for erectile dysfunction.   Not Taking at Unknown time    Family History  Problem Relation Age of Onset  . Breast cancer Mother   . Huntington's disease Father   . Cancer Paternal Uncle        Colon  . Diabetes Son   . Cancer Maternal Grandfather   . Cancer Paternal Grandmother   . Heart disease Paternal Grandmother   . Huntington's disease Paternal Grandmother      Review of Systems:   Review of Systems  Constitutional: Positive for malaise/fatigue and weight loss.  HENT: Negative.   Respiratory: Positive for shortness of breath.   Cardiovascular: Positive for chest pain.  Gastrointestinal: Positive for diarrhea.  Skin: Negative.   Neurological: Negative.   Endo/Heme/Allergies: Negative.        Endocrine: diabetes[no  ];  thyroid dysfunction[no  ];  Immunizations: Flu [  ]; Pneumococcal[  ];    Physical Exam: BP 123/77 (BP Location: Right Arm)   Pulse 62   Temp 98.2 F (36.8 C) (Oral)   Resp 11   Ht 5\' 10"  (1.778 m)   Wt 133 lb (60.3 kg)   SpO2 98%   BMI 19.08 kg/m    General appearance: alert, cooperative and no distress Resp: clear to  auscultation bilaterally Cardio: NSR to sinus brady GI: soft, non-tender; bowel sounds normal; no masses,  no organomegaly Extremities: extremities normal, atraumatic, no cyanosis or edema  Diagnostic Studies & Laboratory data:  Cardiac Catheterization: Conclusion     Prox LAD lesion is 90% stenosed.  Ost 1st Diag lesion is 70% stenosed.  Mid RCA lesion is 95% stenosed.  Ost 1st Mrg lesion is 70% stenosed.  Dist LM to Ost LAD lesion is 70% stenosed.   3 vessel disease with normal LVEf, advise CABG         Recent Radiology Findings:   Dg Chest 2 View  Result Date: 10/29/2017 CLINICAL DATA:  Chest pain EXAM: CHEST  2 VIEW COMPARISON:  01/12/2016 FINDINGS: Mild hyperinflation appearing no focal pneumonia, collapse or consolidation. Normal heart size and vascularity. Healed rib fractures bilaterally. Negative for edema, effusion or pneumothorax. Trachea is midline. Postop changes of the left shoulder. IMPRESSION: Stable hyperinflation without acute chest process. Electronically Signed   By: Jerilynn Mages.  Shick M.D.   On: 10/29/2017 18:50     I have independently reviewed the above radiologic studies.  Recent Lab Findings: Lab Results  Component Value Date   WBC 3.7 (L) 10/30/2017   HGB 14.5 10/30/2017   HCT 44.0 10/30/2017   PLT 234 10/30/2017   GLUCOSE 93 10/30/2017   CHOL 169 01/24/2015   TRIG  82 01/24/2015   HDL 37 (L) 01/24/2015   LDLCALC 116 (H) 01/24/2015   ALT 31 10/19/2017   AST 30 10/19/2017   NA 138 10/30/2017   K 4.5 10/30/2017   CL 106 10/30/2017   CREATININE 0.73 10/30/2017   BUN 10 10/30/2017   CO2 28 10/30/2017   TSH 1.252 10/30/2017   INR 0.96 10/30/2017   HGBA1C 5.0 10/30/2017    Lab Results  Component Value Date   TROPONINI <0.03 10/30/2017     Assessment / Plan:   #1 coronary occlusive disease three-vessel-I reviewed the cath films and discussed with the patient recommendation to proceed with coronary artery bypass grafting.  Risks and  options were discussed with him in detail and he is willing to proceed.  We will arrange to proceed with surgery on Monday.  The goals risks and alternatives of the planned surgical procedure Procedure(s): CORONARY ARTERY BYPASS GRAFTING (CABG) (N/A) TRANSESOPHAGEAL ECHOCARDIOGRAM (TEE) (N/A)  have been discussed with the patient in detail. The risks of the procedure including death, infection, stroke, myocardial infarction, bleeding, blood transfusion have all been discussed specifically.  I have quoted Charlie Pitter a 2% of perioperative mortality and a complication rate as high as 30 %. The patient's questions have been answered.Charlie Pitter is willing  to proceed with the planned procedure.   Grace Isaac MD      [address] [place] [address] Office [phone]   [place]

## 2017-10-31 NOTE — Care Management Note (Signed)
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

## 2017-10-31 NOTE — Progress Notes (Signed)
[id number] Did not walk with pt due to LM disease. Discussed sternal precautions and demonstrated how to get up and down without use of arms. Gave OHS booklet and care guide. Wrote down how to view pre op video if system up. Discussed with pt need to have someone with him 24/7 first week home. Stated he would work on it. Gave IS and worked with pt until he demonstrated 2250 ml correctly. Discussed the importance of IS and walking after surgery. Graylon Good RN BSN 10/31/2017 1:21 PM

## 2017-10-31 NOTE — Progress Notes (Signed)
Pt arrived to 4e from [facility]. Vitals obtained. Pt oriented to room and staff. Telemetry box applied and CCMD notified. Pt denies needs at this time. Will continue current plan of care.   Grant Fontana BSN, RN

## 2017-10-31 NOTE — Progress Notes (Signed)
  Echocardiogram 2D Echocardiogram has been performed.  Jeffrey Prince 10/31/2017, 3:56 PM

## 2017-11-01 ENCOUNTER — Inpatient Hospital Stay (HOSPITAL_COMMUNITY): Payer: BLUE CROSS/BLUE SHIELD

## 2017-11-01 DIAGNOSIS — Z01810 Encounter for preprocedural cardiovascular examination: Secondary | ICD-10-CM

## 2017-11-01 LAB — CBC
HCT: 41.5 % (ref 39.0–52.0)
HEMOGLOBIN: 13.6 g/dL (ref 13.0–17.0)
MCH: 28.9 pg (ref 26.0–34.0)
MCHC: 32.8 g/dL (ref 30.0–36.0)
MCV: 88.3 fL (ref 78.0–100.0)
Platelets: 221 10*3/uL (ref 150–400)
RBC: 4.7 MIL/uL (ref 4.22–5.81)
RDW: 13.6 % (ref 11.5–15.5)
WBC: 4.8 10*3/uL (ref 4.0–10.5)

## 2017-11-01 LAB — BASIC METABOLIC PANEL
ANION GAP: 9 (ref 5–15)
BUN: 12 mg/dL (ref 6–20)
CHLORIDE: 107 mmol/L (ref 101–111)
CO2: 24 mmol/L (ref 22–32)
Calcium: 9 mg/dL (ref 8.9–10.3)
Creatinine, Ser: 0.73 mg/dL (ref 0.61–1.24)
GFR calc Af Amer: >60 mL/min (ref >60)
GLUCOSE: 76 mg/dL (ref 65–99)
POTASSIUM: 3.7 mmol/L (ref 3.5–5.1)
Sodium: 140 mmol/L (ref 135–145)

## 2017-11-01 LAB — HIV ANTIBODY (ROUTINE TESTING W REFLEX): HIV Screen 4th Generation wRfx: NONREACTIVE

## 2017-11-01 NOTE — Progress Notes (Addendum)
      [address]       [place] [address]             [phone]        Procedure(s) (LRB): CORONARY ARTERY BYPASS GRAFTING (CABG) (N/A) TRANSESOPHAGEAL ECHOCARDIOGRAM (TEE) (N/A) Subjective: Some chest discomfort- stays the same, no distress , appears comfortable  Objective: Vital signs in last 24 hours: Temp:  [97.6 F (36.4 C)-98.2 F (36.8 C)] 97.6 F (36.4 C) (12/29 0434) Pulse Rate:  [52-66] 66 (12/29 0434) Cardiac Rhythm: Sinus bradycardia (12/29 0700) Resp:  [11-20] 20 (12/29 0434) BP: (107-123)/(66-77) 110/66 (12/29 0434) SpO2:  [98 %-99 %] 99 % (12/29 0434) Weight:  [133 lb (60.3 kg)-133 lb 14.4 oz (60.7 kg)] 133 lb 14.4 oz (60.7 kg) (12/29 0511)  Hemodynamic parameters for last 24 hours:    Intake/Output from previous day: No intake/output data recorded. Intake/Output this shift: No intake/output data recorded.  General appearance: alert, cooperative and no distress Heart: regular rate and rhythm and braddy Lungs: clear to auscultation bilaterally Abdomen: benign Extremities: PAS in place Wound: N/A  Lab Results: Recent Labs    10/31/17 1135 11/01/17 0403  WBC 5.1 4.8  HGB 14.6 13.6  HCT 44.1 41.5  PLT 222 221   BMET:  Recent Labs    10/31/17 1135 11/01/17 0403  NA 139 140  K 3.9 3.7  CL 108 107  CO2 24 24  GLUCOSE 83 76  BUN 9 12  CREATININE 0.89 0.73  CALCIUM 9.4 9.0    PT/INR:  Recent Labs    10/30/17 1201  LABPROT 12.7  INR 0.96   ABG No results found for: PHART, HCO3, TCO2, ACIDBASEDEF, O2SAT CBG (last 3)  No results for input(s): GLUCAP in the last 72 hours.  Meds Scheduled Meds: . aspirin EC  81 mg Oral Daily  . atorvastatin  20 mg Oral Daily  . enoxaparin (LOVENOX) injection  1 mg/kg Subcutaneous Q12H  . famotidine  10 mg Oral Daily  . feeding supplement (ENSURE ENLIVE)  237 mL Oral BID BM  . nitroGLYCERIN  1 inch Topical Q6H   Continuous Infusions: PRN Meds:.acetaminophen, hyoscyamine,  lidocaine, nitroGLYCERIN, ondansetron (ZOFRAN) IV  Xrays X-ray Chest Pa And Lateral  Result Date: 10/31/2017 CLINICAL DATA:  Preoperative evaluation, history smoker, GERD EXAM: CHEST  2 VIEW COMPARISON:  10/29/2017 FINDINGS: Normal heart size, mediastinal contours, and pulmonary vascularity. Lungs remain hyperinflated but clear. No pulmonary infiltrate, pleural effusion or pneumothorax. Old BILATERAL rib fractures. IMPRESSION: Hyperinflated lungs without acute abnormality. Electronically Signed   By: Lavonia Dana M.D.   On: 10/31/2017 14:42    Assessment/Plan: S/P Procedure(s) (LRB): CORONARY ARTERY BYPASS GRAFTING (CABG) (N/A) TRANSESOPHAGEAL ECHOCARDIOGRAM (TEE) (N/A)  1 appears stable in sinus bradycardia, nitro paste helping chest discomfort some. He is not on any neg chronotropes. BP is well controlled. Cont to  Monitor , surgery currently scheduled for monday 3 todays labs are stable     LOS: 1 day    [name] 11/01/2017   Chart reviewed, patient examined, agree with above. He says that he is still having some intermittent left chest discomfort. He is on NTG paste and Lovenox. HR 50's so can't use BB at this time. Plan CABG Monday.

## 2017-11-01 NOTE — Progress Notes (Signed)
Pre-op Cardiac Surgery  Carotid Findings:  1-39% ICA plaquing. Vertebral artery flow is antegrade.   Upper Extremity Right Left  Brachial Pressures 119T 114T  Radial Waveforms T T  Ulnar Waveforms T T  Palmar Arch (Allen's Test) Doppler signal remains normal with radial compression and diminishes >5-% with ulnar compression.  Doppler signal remains normal with radial compression and diminishes >5-% with ulnar compression.    Findings:      Lower  Extremity Right Left  Dorsalis Pedis B B  Anterior Tibial    Posterior Tibial T T  Ankle/Brachial Indices      Findings:

## 2017-11-02 LAB — BLOOD GAS, ARTERIAL
Acid-base deficit: 1.4 mmol/L (ref 0.0–2.0)
Bicarbonate: 22.8 mmol/L (ref 20.0–28.0)
Drawn by: 283381
FIO2: 0.21
O2 Saturation: 97.8 %
Patient temperature: 98.6
pCO2 arterial: 37.5 mmHg (ref 32.0–48.0)
pH, Arterial: 7.4 (ref 7.350–7.450)
pO2, Arterial: 101 mmHg (ref 83.0–108.0)

## 2017-11-02 LAB — TYPE AND SCREEN
ABO/RH(D): O POS
Antibody Screen: NEGATIVE

## 2017-11-02 LAB — ABO/RH: ABO/RH(D): O POS

## 2017-11-02 LAB — PROTIME-INR
INR: 1.01
Prothrombin Time: 13.2 seconds (ref 11.4–15.2)

## 2017-11-02 LAB — APTT: aPTT: 38 seconds — ABNORMAL HIGH (ref 24–36)

## 2017-11-02 LAB — HEMOGLOBIN A1C
Hgb A1c MFr Bld: 5 % (ref 4.8–5.6)
Mean Plasma Glucose: 96.8 mg/dL

## 2017-11-02 MED ORDER — CHLORHEXIDINE GLUCONATE CLOTH 2 % EX PADS
6.0000 | MEDICATED_PAD | Freq: Once | CUTANEOUS | Status: AC
  Administered 2017-11-03: 6 via TOPICAL

## 2017-11-02 MED ORDER — DOPAMINE-DEXTROSE 3.2-5 MG/ML-% IV SOLN
0.0000 ug/kg/min | INTRAVENOUS | Status: AC
  Administered 2017-11-03: 3 ug/kg/min via INTRAVENOUS
  Filled 2017-11-02: qty 250

## 2017-11-02 MED ORDER — METOPROLOL TARTRATE 12.5 MG HALF TABLET
12.5000 mg | ORAL_TABLET | Freq: Once | ORAL | Status: DC
  Filled 2017-11-02: qty 1

## 2017-11-02 MED ORDER — CHLORHEXIDINE GLUCONATE CLOTH 2 % EX PADS
6.0000 | MEDICATED_PAD | Freq: Once | CUTANEOUS | Status: AC
  Administered 2017-11-02: 6 via TOPICAL

## 2017-11-02 MED ORDER — POTASSIUM CHLORIDE 2 MEQ/ML IV SOLN
80.0000 meq | INTRAVENOUS | Status: DC
  Filled 2017-11-02: qty 40

## 2017-11-02 MED ORDER — DEXMEDETOMIDINE HCL IN NACL 400 MCG/100ML IV SOLN
0.1000 ug/kg/h | INTRAVENOUS | Status: AC
  Administered 2017-11-03: .3 ug/kg/h via INTRAVENOUS
  Filled 2017-11-02: qty 100

## 2017-11-02 MED ORDER — PLASMA-LYTE 148 IV SOLN
INTRAVENOUS | Status: AC
  Administered 2017-11-03: 500 mL
  Filled 2017-11-02: qty 2.5

## 2017-11-02 MED ORDER — TRANEXAMIC ACID (OHS) PUMP PRIME SOLUTION
2.0000 mg/kg | INTRAVENOUS | Status: DC
  Filled 2017-11-02: qty 1.22

## 2017-11-02 MED ORDER — TRANEXAMIC ACID (OHS) BOLUS VIA INFUSION
15.0000 mg/kg | INTRAVENOUS | Status: AC
  Administered 2017-11-03: 918 mg via INTRAVENOUS
  Filled 2017-11-02: qty 918

## 2017-11-02 MED ORDER — CHLORHEXIDINE GLUCONATE 0.12 % MT SOLN
15.0000 mL | Freq: Once | OROMUCOSAL | Status: AC
  Administered 2017-11-03: 15 mL via OROMUCOSAL
  Filled 2017-11-02: qty 15

## 2017-11-02 MED ORDER — SODIUM CHLORIDE 0.9 % IV SOLN
INTRAVENOUS | Status: AC
  Administered 2017-11-03: .4 [IU]/h via INTRAVENOUS
  Filled 2017-11-02: qty 1

## 2017-11-02 MED ORDER — NITROGLYCERIN IN D5W 200-5 MCG/ML-% IV SOLN
2.0000 ug/min | INTRAVENOUS | Status: AC
  Administered 2017-11-03: 5 ug/min via INTRAVENOUS
  Filled 2017-11-02: qty 250

## 2017-11-02 MED ORDER — SODIUM CHLORIDE 0.9 % IV SOLN
1250.0000 mg | INTRAVENOUS | Status: AC
  Administered 2017-11-03: 1250 mg via INTRAVENOUS
  Filled 2017-11-02: qty 1250

## 2017-11-02 MED ORDER — SODIUM CHLORIDE 0.9 % IV SOLN
1.5000 mg/kg/h | INTRAVENOUS | Status: AC
  Administered 2017-11-03: 1.5 mg/kg/h via INTRAVENOUS
  Filled 2017-11-02: qty 25

## 2017-11-02 MED ORDER — MILRINONE LACTATE IN DEXTROSE 20-5 MG/100ML-% IV SOLN
0.1250 ug/kg/min | INTRAVENOUS | Status: DC
  Filled 2017-11-02: qty 100

## 2017-11-02 MED ORDER — SODIUM CHLORIDE 0.9 % IV SOLN
30.0000 ug/min | INTRAVENOUS | Status: AC
  Administered 2017-11-03: 40 ug/min via INTRAVENOUS
  Filled 2017-11-02: qty 2

## 2017-11-02 MED ORDER — EPINEPHRINE PF 1 MG/ML IJ SOLN
0.0000 ug/min | INTRAVENOUS | Status: DC
  Filled 2017-11-02: qty 4

## 2017-11-02 MED ORDER — SODIUM CHLORIDE 0.9 % IV SOLN
INTRAVENOUS | Status: DC
  Filled 2017-11-02: qty 30

## 2017-11-02 MED ORDER — TEMAZEPAM 7.5 MG PO CAPS: 15.0000 mg | ORAL_CAPSULE | Freq: Once | ORAL | Status: DC | PRN

## 2017-11-02 MED ORDER — MAGNESIUM SULFATE 50 % IJ SOLN
40.0000 meq | INTRAMUSCULAR | Status: DC
  Filled 2017-11-02: qty 9.85

## 2017-11-02 MED ORDER — CEFUROXIME SODIUM 750 MG IJ SOLR
750.0000 mg | INTRAMUSCULAR | Status: DC
  Filled 2017-11-02: qty 750

## 2017-11-02 MED ORDER — DEXTROSE 5 % IV SOLN
1.5000 g | INTRAVENOUS | Status: AC
  Administered 2017-11-03: .75 g via INTRAVENOUS
  Administered 2017-11-03: 1.5 g via INTRAVENOUS
  Filled 2017-11-02: qty 1.5

## 2017-11-02 MED ORDER — BISACODYL 5 MG PO TBEC
5.0000 mg | DELAYED_RELEASE_TABLET | Freq: Once | ORAL | Status: DC
  Filled 2017-11-02: qty 1

## 2017-11-02 NOTE — Progress Notes (Addendum)
      Jeffrey Prince       [place] [address]             [phone]        Procedure(s) (LRB): CORONARY ARTERY BYPASS GRAFTING (CABG) (N/A) TRANSESOPHAGEAL ECHOCARDIOGRAM (TEE) (N/A) Subjective: Feels the same, some chest discomfort  Objective: Vital signs in last 24 hours: Temp:  [97.4 F (36.3 C)-98.3 F (36.8 C)] 98.3 F (36.8 C) (12/30 0412) Pulse Rate:  [54-116] 116 (12/30 0412) Cardiac Rhythm: Sinus bradycardia (12/30 0700) Resp:  [12-22] 16 (12/30 0412) BP: (105-124)/(64-87) 105/64 (12/30 0412) SpO2:  [93 %-100 %] 93 % (12/30 0412) Weight:  [134 lb 14.7 oz (61.2 kg)] 134 lb 14.7 oz (61.2 kg) (12/30 0412)  Hemodynamic parameters for last 24 hours:    Intake/Output from previous day: 12/29 0701 - 12/30 0700 In: 720 [P.O.:720] Out: -  Intake/Output this shift: No intake/output data recorded.  General appearance: alert, cooperative and no distress Heart: regular rate and rhythm and no JVD Lungs: clear to auscultation bilaterally Abdomen: benign exam Extremities: PAS in place , n edema  Lab Results: Recent Labs    10/31/17 1135 11/01/17 0403  WBC 5.1 4.8  HGB 14.6 13.6  HCT 44.1 41.5  PLT 222 221   BMET:  Recent Labs    10/31/17 1135 11/01/17 0403  NA 139 140  K 3.9 3.7  CL 108 107  CO2 24 24  GLUCOSE 83 76  BUN 9 12  CREATININE 0.89 0.73  CALCIUM 9.4 9.0    PT/INR:  Recent Labs    10/30/17 1201  LABPROT 12.7  INR 0.96   ABG No results found for: PHART, HCO3, TCO2, ACIDBASEDEF, O2SAT CBG (last 3)  No results for input(s): GLUCAP in the last 72 hours.  Meds Scheduled Meds: . aspirin EC  81 mg Oral Daily  . atorvastatin  20 mg Oral Daily  . enoxaparin (LOVENOX) injection  1 mg/kg Subcutaneous Q12H  . famotidine  10 mg Oral Daily  . feeding supplement (ENSURE ENLIVE)  237 mL Oral BID BM  . nitroGLYCERIN  1 inch Topical Q6H   Continuous Infusions: PRN Meds:.acetaminophen, hyoscyamine, lidocaine, nitroGLYCERIN,  ondansetron (ZOFRAN) IV  Xrays X-ray Chest Pa And Lateral  Result Date: 10/31/2017 CLINICAL DATA:  Preoperative evaluation, history smoker, GERD EXAM: CHEST  2 VIEW COMPARISON:  10/29/2017 FINDINGS: Normal heart size, mediastinal contours, and pulmonary vascularity. Lungs remain hyperinflated but clear. No pulmonary infiltrate, pleural effusion or pneumothorax. Old BILATERAL rib fractures. IMPRESSION: Hyperinflated lungs without acute abnormality. Electronically Signed   By: Lavonia Dana M.D.   On: 10/31/2017 14:42    Assessment/Plan: S/P Procedure(s) (LRB): CORONARY ARTERY BYPASS GRAFTING (CABG) (N/A) TRANSESOPHAGEAL ECHOCARDIOGRAM (TEE) (N/A) stable on current rx, remains bradycardic. Plan for OR tomorrow   LOS: 2 days    Jeffrey Prince 11/02/2017  Chart reviewed, patient examined, agree with above. Says he is still having intermittent chest pain. Plan CABG in am.

## 2017-11-03 ENCOUNTER — Encounter (HOSPITAL_COMMUNITY)
Admission: AD | Disposition: A | Discharge status: Home Health | Payer: Self-pay | Source: Other Acute Inpatient Hospital | Attending: Cardiothoracic Surgery

## 2017-11-03 ENCOUNTER — Inpatient Hospital Stay (HOSPITAL_COMMUNITY)
Admission: AD | Admit: 2017-11-03 | Discharge: 2017-11-03 | Disposition: A | Discharge status: Home / Self Care | Payer: BLUE CROSS/BLUE SHIELD | Source: Other Acute Inpatient Hospital | Attending: Cardiothoracic Surgery | Admitting: Cardiothoracic Surgery

## 2017-11-03 ENCOUNTER — Inpatient Hospital Stay (HOSPITAL_COMMUNITY): Payer: BLUE CROSS/BLUE SHIELD | Admitting: Certified Registered Nurse Anesthetist

## 2017-11-03 ENCOUNTER — Inpatient Hospital Stay (HOSPITAL_COMMUNITY): Payer: BLUE CROSS/BLUE SHIELD

## 2017-11-03 DIAGNOSIS — I25110 Atherosclerotic heart disease of native coronary artery with unstable angina pectoris: Secondary | ICD-10-CM

## 2017-11-03 DIAGNOSIS — I2510 Atherosclerotic heart disease of native coronary artery without angina pectoris: Secondary | ICD-10-CM | POA: Diagnosis present

## 2017-11-03 HISTORY — PX: CORONARY ARTERY BYPASS GRAFT: SHX141

## 2017-11-03 HISTORY — PX: TEE WITHOUT CARDIOVERSION: SHX5443

## 2017-11-03 LAB — APTT: APTT: 34 s (ref 24–36)

## 2017-11-03 LAB — POCT I-STAT 3, ART BLOOD GAS (G3+)
Acid-base deficit: 2 mmol/L (ref 0.0–2.0)
Acid-base deficit: 7 mmol/L — ABNORMAL HIGH (ref 0.0–2.0)
Acid-base deficit: 4 mmol/L — ABNORMAL HIGH (ref 0.0–2.0)
Bicarbonate: 25 mmol/L (ref 20.0–28.0)
Bicarbonate: 20.3 mmol/L (ref 20.0–28.0)
Bicarbonate: 18.1 mmol/L — ABNORMAL LOW (ref 20.0–28.0)
Bicarbonate: 20.7 mmol/L (ref 20.0–28.0)
O2 Saturation: 100 %
O2 Saturation: 100 %
O2 Saturation: 100 %
O2 Saturation: 100 %
Patient temperature: 36
Patient temperature: 36
Patient temperature: 36.6
TCO2: 26 mmol/L (ref 22–32)
TCO2: 21 mmol/L — ABNORMAL LOW (ref 22–32)
TCO2: 19 mmol/L — ABNORMAL LOW (ref 22–32)
TCO2: 22 mmol/L (ref 22–32)
pCO2 arterial: 39.3 mmHg (ref 32.0–48.0)
pCO2 arterial: 26.7 mmHg — ABNORMAL LOW (ref 32.0–48.0)
pCO2 arterial: 31.4 mmHg — ABNORMAL LOW (ref 32.0–48.0)
pCO2 arterial: 34.7 mmHg (ref 32.0–48.0)
pH, Arterial: 7.41 (ref 7.350–7.450)
pH, Arterial: 7.485 — ABNORMAL HIGH (ref 7.350–7.450)
pH, Arterial: 7.365 (ref 7.350–7.450)
pH, Arterial: 7.382 (ref 7.350–7.450)
pO2, Arterial: 307 mmHg — ABNORMAL HIGH (ref 83.0–108.0)
pO2, Arterial: 165 mmHg — ABNORMAL HIGH (ref 83.0–108.0)
pO2, Arterial: 176 mmHg — ABNORMAL HIGH (ref 83.0–108.0)
pO2, Arterial: 171 mmHg — ABNORMAL HIGH (ref 83.0–108.0)

## 2017-11-03 LAB — POCT I-STAT, CHEM 8
BUN: 9 mg/dL (ref 6–20)
BUN: 7 mg/dL (ref 6–20)
BUN: 9 mg/dL (ref 6–20)
BUN: 8 mg/dL (ref 6–20)
BUN: 6 mg/dL (ref 6–20)
Calcium, Ion: 1.21 mmol/L (ref 1.15–1.40)
Calcium, Ion: 1.05 mmol/L — ABNORMAL LOW (ref 1.15–1.40)
Calcium, Ion: 1.25 mmol/L (ref 1.15–1.40)
Calcium, Ion: 1.09 mmol/L — ABNORMAL LOW (ref 1.15–1.40)
Calcium, Ion: 1.04 mmol/L — ABNORMAL LOW (ref 1.15–1.40)
Chloride: 107 mmol/L (ref 101–111)
Chloride: 103 mmol/L (ref 101–111)
Chloride: 107 mmol/L (ref 101–111)
Chloride: 104 mmol/L (ref 101–111)
Chloride: 104 mmol/L (ref 101–111)
Creatinine, Ser: 0.3 mg/dL — ABNORMAL LOW (ref 0.61–1.24)
Creatinine, Ser: 0.3 mg/dL — ABNORMAL LOW (ref 0.61–1.24)
Creatinine, Ser: 0.4 mg/dL — ABNORMAL LOW (ref 0.61–1.24)
Creatinine, Ser: 0.3 mg/dL — ABNORMAL LOW (ref 0.61–1.24)
Creatinine, Ser: 0.2 mg/dL — ABNORMAL LOW (ref 0.61–1.24)
Glucose, Bld: 81 mg/dL (ref 65–99)
Glucose, Bld: 104 mg/dL — ABNORMAL HIGH (ref 65–99)
Glucose, Bld: 96 mg/dL (ref 65–99)
Glucose, Bld: 152 mg/dL — ABNORMAL HIGH (ref 65–99)
Glucose, Bld: 127 mg/dL — ABNORMAL HIGH (ref 65–99)
HCT: 37 % — ABNORMAL LOW (ref 39.0–52.0)
HCT: 30 % — ABNORMAL LOW (ref 39.0–52.0)
HCT: 34 % — ABNORMAL LOW (ref 39.0–52.0)
HCT: 30 % — ABNORMAL LOW (ref 39.0–52.0)
HCT: 26 % — ABNORMAL LOW (ref 39.0–52.0)
Hemoglobin: 12.6 g/dL — ABNORMAL LOW (ref 13.0–17.0)
Hemoglobin: 10.2 g/dL — ABNORMAL LOW (ref 13.0–17.0)
Hemoglobin: 11.6 g/dL — ABNORMAL LOW (ref 13.0–17.0)
Hemoglobin: 10.2 g/dL — ABNORMAL LOW (ref 13.0–17.0)
Hemoglobin: 8.8 g/dL — ABNORMAL LOW (ref 13.0–17.0)
Potassium: 3.5 mmol/L (ref 3.5–5.1)
Potassium: 3.6 mmol/L (ref 3.5–5.1)
Potassium: 3.7 mmol/L (ref 3.5–5.1)
Potassium: 4.1 mmol/L (ref 3.5–5.1)
Potassium: 3.2 mmol/L — ABNORMAL LOW (ref 3.5–5.1)
Sodium: 142 mmol/L (ref 135–145)
Sodium: 141 mmol/L (ref 135–145)
Sodium: 143 mmol/L (ref 135–145)
Sodium: 141 mmol/L (ref 135–145)
Sodium: 142 mmol/L (ref 135–145)
TCO2: 27 mmol/L (ref 22–32)
TCO2: 25 mmol/L (ref 22–32)
TCO2: 28 mmol/L (ref 22–32)
TCO2: 24 mmol/L (ref 22–32)
TCO2: 26 mmol/L (ref 22–32)

## 2017-11-03 LAB — POCT I-STAT 4, (NA,K, GLUC, HGB,HCT)
Glucose, Bld: 105 mg/dL — ABNORMAL HIGH (ref 65–99)
HCT: 34 % — ABNORMAL LOW (ref 39.0–52.0)
Hemoglobin: 11.6 g/dL — ABNORMAL LOW (ref 13.0–17.0)
Potassium: 3.3 mmol/L — ABNORMAL LOW (ref 3.5–5.1)
Sodium: 143 mmol/L (ref 135–145)

## 2017-11-03 LAB — BASIC METABOLIC PANEL
Anion gap: 6 (ref 5–15)
BUN: 13 mg/dL (ref 6–20)
CO2: 28 mmol/L (ref 22–32)
Calcium: 9.2 mg/dL (ref 8.9–10.3)
Chloride: 104 mmol/L (ref 101–111)
Creatinine, Ser: 0.78 mg/dL (ref 0.61–1.24)
GFR calc Af Amer: >60 mL/min (ref >60)
GFR calc non Af Amer: >60 mL/min (ref >60)
Glucose, Bld: 78 mg/dL (ref 65–99)
Potassium: 4 mmol/L (ref 3.5–5.1)
Sodium: 138 mmol/L (ref 135–145)

## 2017-11-03 LAB — HEMOGLOBIN AND HEMATOCRIT, BLOOD
HCT: 29.8 % — ABNORMAL LOW (ref 39.0–52.0)
Hemoglobin: 10 g/dL — ABNORMAL LOW (ref 13.0–17.0)

## 2017-11-03 LAB — CBC
HCT: 41.2 % (ref 39.0–52.0)
HCT: 33.8 % — ABNORMAL LOW (ref 39.0–52.0)
HCT: 37.9 % — ABNORMAL LOW (ref 39.0–52.0)
HEMOGLOBIN: 11.5 g/dL — AB (ref 13.0–17.0)
Hemoglobin: 13.5 g/dL (ref 13.0–17.0)
Hemoglobin: 12.7 g/dL — ABNORMAL LOW (ref 13.0–17.0)
MCH: 29 pg (ref 26.0–34.0)
MCH: 29.2 pg (ref 26.0–34.0)
MCH: 28.8 pg (ref 26.0–34.0)
MCHC: 32.8 g/dL (ref 30.0–36.0)
MCHC: 34 g/dL (ref 30.0–36.0)
MCHC: 33.5 g/dL (ref 30.0–36.0)
MCV: 88.4 fL (ref 78.0–100.0)
MCV: 85.8 fL (ref 78.0–100.0)
MCV: 85.9 fL (ref 78.0–100.0)
PLATELETS: 127 10*3/uL — AB (ref 150–400)
Platelets: 231 10*3/uL (ref 150–400)
Platelets: 143 10*3/uL — ABNORMAL LOW (ref 150–400)
RBC: 4.66 MIL/uL (ref 4.22–5.81)
RBC: 3.94 MIL/uL — AB (ref 4.22–5.81)
RBC: 4.41 MIL/uL (ref 4.22–5.81)
RDW: 13.4 % (ref 11.5–15.5)
RDW: 13.4 % (ref 11.5–15.5)
RDW: 13.3 % (ref 11.5–15.5)
WBC: 5.5 10*3/uL (ref 4.0–10.5)
WBC: 10 10*3/uL (ref 4.0–10.5)
WBC: 10.1 10*3/uL (ref 4.0–10.5)

## 2017-11-03 LAB — GLUCOSE, CAPILLARY
Glucose-Capillary: 85 mg/dL (ref 65–99)
Glucose-Capillary: 112 mg/dL — ABNORMAL HIGH (ref 65–99)
Glucose-Capillary: 137 mg/dL — ABNORMAL HIGH (ref 65–99)
Glucose-Capillary: 143 mg/dL — ABNORMAL HIGH (ref 65–99)
Glucose-Capillary: 98 mg/dL (ref 65–99)

## 2017-11-03 LAB — MAGNESIUM: Magnesium: 2.7 mg/dL — ABNORMAL HIGH (ref 1.7–2.4)

## 2017-11-03 LAB — CREATININE, SERUM
Creatinine, Ser: 0.61 mg/dL (ref 0.61–1.24)
GFR calc Af Amer: >60 mL/min (ref >60)
GFR calc non Af Amer: >60 mL/min (ref >60)

## 2017-11-03 LAB — PROTIME-INR
INR: 1.31
PROTHROMBIN TIME: 16.2 s — AB (ref 11.4–15.2)

## 2017-11-03 LAB — MRSA PCR SCREENING: MRSA by PCR: NEGATIVE

## 2017-11-03 LAB — PLATELET COUNT: Platelets: 132 10*3/uL — ABNORMAL LOW (ref 150–400)

## 2017-11-03 SURGERY — CORONARY ARTERY BYPASS GRAFTING (CABG)
Anesthesia: General | Site: Chest

## 2017-11-03 MED ORDER — ROCURONIUM BROMIDE 10 MG/ML (PF) SYRINGE
PREFILLED_SYRINGE | INTRAVENOUS | Status: DC | PRN
  Administered 2017-11-03 (×2): 50 mg via INTRAVENOUS
  Administered 2017-11-03: 20 mg via INTRAVENOUS
  Administered 2017-11-03: 50 mg via INTRAVENOUS

## 2017-11-03 MED ORDER — MIDAZOLAM HCL 10 MG/2ML IJ SOLN
INTRAMUSCULAR | Status: AC
  Filled 2017-11-03: qty 2

## 2017-11-03 MED ORDER — LIDOCAINE 2% (20 MG/ML) 5 ML SYRINGE
INTRAMUSCULAR | Status: AC
Start: 2017-11-03 — End: 2017-11-03
  Filled 2017-11-03: qty 5

## 2017-11-03 MED ORDER — LACTATED RINGERS IV SOLN: INTRAVENOUS | Status: DC

## 2017-11-03 MED ORDER — FAMOTIDINE 20 MG PO TABS
10.0000 mg | ORAL_TABLET | Freq: Every day | ORAL | Status: DC
Start: 2017-11-04 — End: 2017-11-08
  Administered 2017-11-04 – 2017-11-08 (×4): 10 mg via ORAL
  Filled 2017-11-03 (×6): qty 1

## 2017-11-03 MED ORDER — SODIUM CHLORIDE 0.9 % IV SOLN: INTRAVENOUS | Status: DC

## 2017-11-03 MED ORDER — LACTATED RINGERS IV SOLN: 500.0000 mL | Freq: Once | INTRAVENOUS | Status: DC | PRN

## 2017-11-03 MED ORDER — FENTANYL CITRATE (PF) 250 MCG/5ML IJ SOLN
INTRAMUSCULAR | Status: AC
  Filled 2017-11-03: qty 5

## 2017-11-03 MED ORDER — BISACODYL 5 MG PO TBEC
10.0000 mg | DELAYED_RELEASE_TABLET | Freq: Every day | ORAL | Status: DC
  Administered 2017-11-04: 10 mg via ORAL
  Filled 2017-11-03: qty 2

## 2017-11-03 MED ORDER — FENTANYL CITRATE (PF) 100 MCG/2ML IJ SOLN: 25.0000 ug | INTRAMUSCULAR | Status: DC | PRN

## 2017-11-03 MED ORDER — PROTAMINE SULFATE 10 MG/ML IV SOLN
INTRAVENOUS | Status: AC
  Filled 2017-11-03: qty 25

## 2017-11-03 MED ORDER — LACTATED RINGERS IV SOLN
INTRAVENOUS | Status: DC
  Administered 2017-11-03 – 2017-11-04 (×3): via INTRAVENOUS

## 2017-11-03 MED ORDER — POTASSIUM CHLORIDE 10 MEQ/50ML IV SOLN
INTRAVENOUS | Status: AC
  Administered 2017-11-03: 10 meq via INTRAVENOUS
  Filled 2017-11-03: qty 150

## 2017-11-03 MED ORDER — ONDANSETRON HCL 4 MG/2ML IJ SOLN
INTRAMUSCULAR | Status: DC | PRN
  Administered 2017-11-03: 4 mg via INTRAVENOUS

## 2017-11-03 MED ORDER — CHLORHEXIDINE GLUCONATE 0.12% ORAL RINSE (MEDLINE KIT)
15.0000 mL | Freq: Two times a day (BID) | OROMUCOSAL | Status: DC
  Administered 2017-11-03 – 2017-11-04 (×3): 15 mL via OROMUCOSAL

## 2017-11-03 MED ORDER — ACETAMINOPHEN 500 MG PO TABS
1000.0000 mg | ORAL_TABLET | Freq: Four times a day (QID) | ORAL | Status: DC
  Administered 2017-11-03 – 2017-11-05 (×5): 1000 mg via ORAL
  Filled 2017-11-03 (×5): qty 2

## 2017-11-03 MED ORDER — ROCURONIUM BROMIDE 10 MG/ML (PF) SYRINGE
PREFILLED_SYRINGE | INTRAVENOUS | Status: AC
  Filled 2017-11-03: qty 10

## 2017-11-03 MED ORDER — ACETAMINOPHEN 160 MG/5ML PO SOLN: 650.0000 mg | Freq: Once | ORAL | Status: AC

## 2017-11-03 MED ORDER — 0.9 % SODIUM CHLORIDE (POUR BTL) OPTIME
TOPICAL | Status: DC | PRN
  Administered 2017-11-03: 6000 mL

## 2017-11-03 MED ORDER — ALBUMIN HUMAN 5 % IV SOLN
250.0000 mL | INTRAVENOUS | Status: AC | PRN
  Administered 2017-11-04: 250 mL via INTRAVENOUS

## 2017-11-03 MED ORDER — HEPARIN SODIUM (PORCINE) 1000 UNIT/ML IJ SOLN
INTRAMUSCULAR | Status: DC | PRN
  Administered 2017-11-03: 19000 [IU] via INTRAVENOUS

## 2017-11-03 MED ORDER — DEXAMETHASONE SODIUM PHOSPHATE 10 MG/ML IJ SOLN
INTRAMUSCULAR | Status: DC | PRN
  Administered 2017-11-03: 10 mg via INTRAVENOUS

## 2017-11-03 MED ORDER — PHENYLEPHRINE HCL 10 MG/ML IJ SOLN: 0.0000 ug/min | INTRAMUSCULAR | Status: DC

## 2017-11-03 MED ORDER — ALBUMIN HUMAN 5 % IV SOLN
INTRAVENOUS | Status: DC | PRN
  Administered 2017-11-03: 12:00:00 via INTRAVENOUS

## 2017-11-03 MED ORDER — DEXAMETHASONE SODIUM PHOSPHATE 10 MG/ML IJ SOLN
INTRAMUSCULAR | Status: AC
  Filled 2017-11-03: qty 1

## 2017-11-03 MED ORDER — EPHEDRINE SULFATE 50 MG/ML IJ SOLN
INTRAMUSCULAR | Status: AC
  Filled 2017-11-03: qty 1

## 2017-11-03 MED ORDER — ORAL CARE MOUTH RINSE
15.0000 mL | Freq: Four times a day (QID) | OROMUCOSAL | Status: DC
  Administered 2017-11-03 – 2017-11-04 (×3): 15 mL via OROMUCOSAL

## 2017-11-03 MED ORDER — MORPHINE SULFATE (PF) 4 MG/ML IV SOLN
2.0000 mg | INTRAVENOUS | Status: DC | PRN
  Administered 2017-11-03 – 2017-11-04 (×3): 2 mg via INTRAVENOUS
  Administered 2017-11-04 (×2): 4 mg via INTRAVENOUS
  Filled 2017-11-03 (×5): qty 1

## 2017-11-03 MED ORDER — ONDANSETRON HCL 4 MG/2ML IJ SOLN
INTRAMUSCULAR | Status: AC
  Filled 2017-11-03: qty 2

## 2017-11-03 MED ORDER — ASPIRIN 81 MG PO CHEW: 324.0000 mg | CHEWABLE_TABLET | Freq: Every day | ORAL | Status: DC

## 2017-11-03 MED ORDER — SODIUM CHLORIDE 0.45 % IV SOLN: INTRAVENOUS | Status: DC | PRN

## 2017-11-03 MED ORDER — GLYCOPYRROLATE 0.2 MG/ML IJ SOLN
INTRAMUSCULAR | Status: DC | PRN
  Administered 2017-11-03: 0.2 mg via INTRAVENOUS
  Administered 2017-11-03: .2 mg via INTRAVENOUS

## 2017-11-03 MED ORDER — INSULIN ASPART 100 UNIT/ML ~~LOC~~ SOLN
0.0000 [IU] | SUBCUTANEOUS | Status: DC
  Administered 2017-11-03 (×2): 2 [IU] via SUBCUTANEOUS

## 2017-11-03 MED ORDER — METOPROLOL TARTRATE 12.5 MG HALF TABLET
12.5000 mg | ORAL_TABLET | Freq: Two times a day (BID) | ORAL | Status: DC
  Administered 2017-11-03 – 2017-11-08 (×10): 12.5 mg via ORAL
  Filled 2017-11-03 (×10): qty 1

## 2017-11-03 MED ORDER — CHLORHEXIDINE GLUCONATE 0.12 % MT SOLN
15.0000 mL | OROMUCOSAL | Status: AC
  Administered 2017-11-03: 15 mL via OROMUCOSAL

## 2017-11-03 MED ORDER — FENTANYL CITRATE (PF) 250 MCG/5ML IJ SOLN
INTRAMUSCULAR | Status: DC | PRN
  Administered 2017-11-03 (×2): 250 ug via INTRAVENOUS
  Administered 2017-11-03: 50 ug via INTRAVENOUS
  Administered 2017-11-03: 100 ug via INTRAVENOUS
  Administered 2017-11-03 (×2): 250 ug via INTRAVENOUS
  Administered 2017-11-03: 150 ug via INTRAVENOUS
  Administered 2017-11-03: 200 ug via INTRAVENOUS
  Administered 2017-11-03: 250 ug via INTRAVENOUS

## 2017-11-03 MED ORDER — LIDOCAINE 2% (20 MG/ML) 5 ML SYRINGE
INTRAMUSCULAR | Status: AC
  Filled 2017-11-03: qty 5

## 2017-11-03 MED ORDER — METOPROLOL TARTRATE 5 MG/5ML IV SOLN: 2.5000 mg | INTRAVENOUS | Status: DC | PRN

## 2017-11-03 MED ORDER — ARTIFICIAL TEARS OPHTHALMIC OINT
TOPICAL_OINTMENT | OPHTHALMIC | Status: AC
  Filled 2017-11-03: qty 3.5

## 2017-11-03 MED ORDER — SODIUM CHLORIDE 0.9 % IJ SOLN
INTRAMUSCULAR | Status: AC
Start: 2017-11-03 — End: 2017-11-03
  Filled 2017-11-03: qty 10

## 2017-11-03 MED ORDER — PROPOFOL 10 MG/ML IV BOLUS
INTRAVENOUS | Status: DC | PRN
  Administered 2017-11-03: 100 mg via INTRAVENOUS

## 2017-11-03 MED ORDER — MORPHINE SULFATE (PF) 2 MG/ML IV SOLN: 1.0000 mg | INTRAVENOUS | Status: DC | PRN

## 2017-11-03 MED ORDER — LACTATED RINGERS IV SOLN
INTRAVENOUS | Status: DC | PRN
  Administered 2017-11-03 (×2): via INTRAVENOUS

## 2017-11-03 MED ORDER — SODIUM CHLORIDE 0.9 % IV SOLN: 0.0000 ug/kg/h | INTRAVENOUS | Status: DC

## 2017-11-03 MED ORDER — ACETAMINOPHEN 160 MG/5ML PO SOLN: 1000.0000 mg | Freq: Four times a day (QID) | ORAL | Status: DC

## 2017-11-03 MED ORDER — SODIUM CHLORIDE 0.9 % IJ SOLN
INTRAMUSCULAR | Status: DC | PRN
  Administered 2017-11-03 (×3): 4 mL via TOPICAL

## 2017-11-03 MED ORDER — POTASSIUM CHLORIDE 10 MEQ/50ML IV SOLN
10.0000 meq | INTRAVENOUS | Status: AC
  Administered 2017-11-03 (×3): 10 meq via INTRAVENOUS

## 2017-11-03 MED ORDER — SODIUM CHLORIDE 0.9% FLUSH
10.0000 mL | Freq: Two times a day (BID) | INTRAVENOUS | Status: DC
  Administered 2017-11-03 – 2017-11-04 (×3): 10 mL

## 2017-11-03 MED ORDER — SODIUM CHLORIDE 0.9% FLUSH
3.0000 mL | Freq: Two times a day (BID) | INTRAVENOUS | Status: DC
  Administered 2017-11-04 (×2): 3 mL via INTRAVENOUS

## 2017-11-03 MED ORDER — TRAMADOL HCL 50 MG PO TABS
50.0000 mg | ORAL_TABLET | ORAL | Status: DC | PRN
  Administered 2017-11-04 – 2017-11-05 (×3): 100 mg via ORAL
  Filled 2017-11-03 (×3): qty 2

## 2017-11-03 MED ORDER — ASPIRIN EC 325 MG PO TBEC
325.0000 mg | DELAYED_RELEASE_TABLET | Freq: Every day | ORAL | Status: DC
  Administered 2017-11-04: 325 mg via ORAL
  Filled 2017-11-03: qty 1

## 2017-11-03 MED ORDER — DOPAMINE-DEXTROSE 3.2-5 MG/ML-% IV SOLN: 0.0000 ug/kg/min | INTRAVENOUS | Status: DC

## 2017-11-03 MED ORDER — LACTATED RINGERS IV SOLN
INTRAVENOUS | Status: DC | PRN
  Administered 2017-11-03: 07:00:00 via INTRAVENOUS

## 2017-11-03 MED ORDER — DEXTROSE 5 % IV SOLN
1.5000 g | Freq: Two times a day (BID) | INTRAVENOUS | Status: AC
  Administered 2017-11-03 – 2017-11-05 (×4): 1.5 g via INTRAVENOUS
  Filled 2017-11-03 (×4): qty 1.5

## 2017-11-03 MED ORDER — ACETAMINOPHEN 650 MG RE SUPP
650.0000 mg | Freq: Once | RECTAL | Status: AC
  Administered 2017-11-03: 650 mg via RECTAL

## 2017-11-03 MED ORDER — DOCUSATE SODIUM 100 MG PO CAPS
200.0000 mg | ORAL_CAPSULE | Freq: Every day | ORAL | Status: DC
  Administered 2017-11-04: 200 mg via ORAL
  Filled 2017-11-03: qty 2

## 2017-11-03 MED ORDER — ONDANSETRON HCL 4 MG/2ML IJ SOLN: 4.0000 mg | Freq: Four times a day (QID) | INTRAMUSCULAR | Status: DC | PRN

## 2017-11-03 MED ORDER — MORPHINE SULFATE (PF) 4 MG/ML IV SOLN: 1.0000 mg | INTRAVENOUS | Status: AC | PRN

## 2017-11-03 MED ORDER — LIDOCAINE 2% (20 MG/ML) 5 ML SYRINGE
INTRAMUSCULAR | Status: DC | PRN
  Administered 2017-11-03: 50 mg via INTRAVENOUS
  Administered 2017-11-03: 100 mg via INTRAVENOUS

## 2017-11-03 MED ORDER — MORPHINE SULFATE (PF) 2 MG/ML IV SOLN: 2.0000 mg | INTRAVENOUS | Status: DC | PRN

## 2017-11-03 MED ORDER — SODIUM CHLORIDE 0.9 % IV SOLN
250.0000 mL | INTRAVENOUS | Status: DC
  Administered 2017-11-04: 250 mL via INTRAVENOUS

## 2017-11-03 MED ORDER — FAMOTIDINE IN NACL 20-0.9 MG/50ML-% IV SOLN
20.0000 mg | Freq: Two times a day (BID) | INTRAVENOUS | Status: AC
  Administered 2017-11-03 (×2): 20 mg via INTRAVENOUS
  Filled 2017-11-03: qty 50

## 2017-11-03 MED ORDER — PROPOFOL 10 MG/ML IV BOLUS
INTRAVENOUS | Status: AC
  Filled 2017-11-03: qty 20

## 2017-11-03 MED ORDER — BISACODYL 10 MG RE SUPP: 10.0000 mg | Freq: Every day | RECTAL | Status: DC

## 2017-11-03 MED ORDER — MAGNESIUM SULFATE 4 GM/100ML IV SOLN
INTRAVENOUS | Status: AC
  Filled 2017-11-03: qty 100

## 2017-11-03 MED ORDER — METOPROLOL TARTRATE 25 MG/10 ML ORAL SUSPENSION
12.5000 mg | Freq: Two times a day (BID) | ORAL | Status: DC
  Filled 2017-11-03 (×2): qty 10

## 2017-11-03 MED ORDER — MIDAZOLAM HCL 5 MG/5ML IJ SOLN
INTRAMUSCULAR | Status: DC | PRN
  Administered 2017-11-03: 1 mg via INTRAVENOUS
  Administered 2017-11-03: 5 mg via INTRAVENOUS
  Administered 2017-11-03: 3 mg via INTRAVENOUS
  Administered 2017-11-03: 1 mg via INTRAVENOUS

## 2017-11-03 MED ORDER — VANCOMYCIN HCL IN DEXTROSE 1-5 GM/200ML-% IV SOLN
1000.0000 mg | Freq: Once | INTRAVENOUS | Status: AC
  Administered 2017-11-03: 1000 mg via INTRAVENOUS
  Filled 2017-11-03: qty 200

## 2017-11-03 MED ORDER — NITROGLYCERIN IN D5W 200-5 MCG/ML-% IV SOLN: 0.0000 ug/min | INTRAVENOUS | Status: DC

## 2017-11-03 MED ORDER — INSULIN REGULAR BOLUS VIA INFUSION
0.0000 [IU] | Freq: Three times a day (TID) | INTRAVENOUS | Status: DC
  Filled 2017-11-03: qty 10

## 2017-11-03 MED ORDER — MIDAZOLAM HCL 2 MG/2ML IJ SOLN
2.0000 mg | INTRAMUSCULAR | Status: DC | PRN
  Administered 2017-11-03: 2 mg via INTRAVENOUS
  Filled 2017-11-03: qty 2

## 2017-11-03 MED ORDER — MAGNESIUM SULFATE 4 GM/100ML IV SOLN
4.0000 g | Freq: Once | INTRAVENOUS | Status: AC
  Administered 2017-11-03: 4 g via INTRAVENOUS

## 2017-11-03 MED ORDER — LACTATED RINGERS IV SOLN
INTRAVENOUS | Status: DC | PRN
  Administered 2017-11-03 (×2): via INTRAVENOUS

## 2017-11-03 MED ORDER — SODIUM CHLORIDE 0.9% FLUSH: 10.0000 mL | INTRAVENOUS | Status: DC | PRN

## 2017-11-03 MED ORDER — HEMOSTATIC AGENTS (NO CHARGE) OPTIME
TOPICAL | Status: DC | PRN
  Administered 2017-11-03: 1 via TOPICAL

## 2017-11-03 MED ORDER — SODIUM CHLORIDE 0.9% FLUSH: 3.0000 mL | INTRAVENOUS | Status: DC | PRN

## 2017-11-03 MED ORDER — CHLORHEXIDINE GLUCONATE CLOTH 2 % EX PADS
6.0000 | MEDICATED_PAD | Freq: Every day | CUTANEOUS | Status: DC
  Administered 2017-11-03 – 2017-11-04 (×2): 6 via TOPICAL

## 2017-11-03 SURGICAL SUPPLY — 70 items
BAG DECANTER FOR FLEXI CONT (MISCELLANEOUS) ×3 IMPLANT
BANDAGE ACE 4X5 VEL STRL LF (GAUZE/BANDAGES/DRESSINGS) ×3 IMPLANT
BANDAGE ACE 6X5 VEL STRL LF (GAUZE/BANDAGES/DRESSINGS) ×3 IMPLANT
BLADE STERNUM SYSTEM 6 (BLADE) ×3 IMPLANT
BLADE SURG 11 STRL SS (BLADE) ×3 IMPLANT
BNDG GAUZE ELAST 4 BULKY (GAUZE/BANDAGES/DRESSINGS) ×3 IMPLANT
CANISTER SUCT 3000ML PPV (MISCELLANEOUS) ×3 IMPLANT
CATH CPB KIT GERHARDT (MISCELLANEOUS) ×3 IMPLANT
CATH THORACIC 28FR (CATHETERS) ×3 IMPLANT
CLIP FOGARTY SPRING 6M (CLIP) ×3 IMPLANT
CRADLE DONUT ADULT HEAD (MISCELLANEOUS) ×3 IMPLANT
DERMABOND ADHESIVE PROPEN (GAUZE/BANDAGES/DRESSINGS) ×1
DERMABOND ADVANCED .7 DNX6 (GAUZE/BANDAGES/DRESSINGS) ×2 IMPLANT
DRAIN CHANNEL 28F RND 3/8 FF (WOUND CARE) ×3 IMPLANT
DRAPE CARDIOVASCULAR INCISE (DRAPES) ×1
DRAPE SLUSH/WARMER DISC (DRAPES) ×6 IMPLANT
DRAPE SRG 135X102X78XABS (DRAPES) ×2 IMPLANT
DRSG AQUACEL AG ADV 3.5X14 (GAUZE/BANDAGES/DRESSINGS) ×3 IMPLANT
ELECT BLADE 4.0 EZ CLEAN MEGAD (MISCELLANEOUS) ×3
ELECT REM PT RETURN 9FT ADLT (ELECTROSURGICAL) ×6
ELECTRODE BLDE 4.0 EZ CLN MEGD (MISCELLANEOUS) ×2 IMPLANT
ELECTRODE REM PT RTRN 9FT ADLT (ELECTROSURGICAL) ×4 IMPLANT
FELT TEFLON 1X6 (MISCELLANEOUS) ×6 IMPLANT
FLOSEAL 10ML (HEMOSTASIS) ×3 IMPLANT
GAUZE SPONGE 4X4 12PLY STRL (GAUZE/BANDAGES/DRESSINGS) ×6 IMPLANT
GLOVE BIO SURGEON STRL SZ 6 (GLOVE) ×12 IMPLANT
GLOVE BIO SURGEON STRL SZ 6.5 (GLOVE) ×24 IMPLANT
GOWN STRL REUS W/ TWL LRG LVL3 (GOWN DISPOSABLE) ×18 IMPLANT
GOWN STRL REUS W/TWL LRG LVL3 (GOWN DISPOSABLE) ×9
HEMOSTAT POWDER SURGIFOAM 1G (HEMOSTASIS) ×9 IMPLANT
HEMOSTAT SURGICEL 2X14 (HEMOSTASIS) ×3 IMPLANT
KIT BASIN OR (CUSTOM PROCEDURE TRAY) ×3 IMPLANT
KIT CATH SUCT 8FR (CATHETERS) ×3 IMPLANT
KIT ROOM TURNOVER OR (KITS) ×3 IMPLANT
KIT SUCTION CATH 14FR (SUCTIONS) ×6 IMPLANT
KIT VASOVIEW HEMOPRO VH 3000 (KITS) ×3 IMPLANT
LEAD PACING MYOCARDI (MISCELLANEOUS) ×6 IMPLANT
MARKER GRAFT CORONARY BYPASS (MISCELLANEOUS) ×9 IMPLANT
NS IRRIG 1000ML POUR BTL (IV SOLUTION) ×18 IMPLANT
PACK E OPEN HEART (SUTURE) ×3 IMPLANT
PACK OPEN HEART (CUSTOM PROCEDURE TRAY) ×3 IMPLANT
PAD ARMBOARD 7.5X6 YLW CONV (MISCELLANEOUS) ×6 IMPLANT
PAD ELECT DEFIB RADIOL ZOLL (MISCELLANEOUS) ×3 IMPLANT
PENCIL BUTTON HOLSTER BLD 10FT (ELECTRODE) ×3 IMPLANT
PUNCH AORTIC ROTATE  4.5MM 8IN (MISCELLANEOUS) ×3 IMPLANT
SET CARDIOPLEGIA MPS 5001102 (MISCELLANEOUS) ×3 IMPLANT
SPONGE LAP 18X18 X RAY DECT (DISPOSABLE) ×12 IMPLANT
SUT BONE WAX W31G (SUTURE) ×3 IMPLANT
SUT MNCRL AB 4-0 PS2 18 (SUTURE) ×3 IMPLANT
SUT PROLENE 3 0 SH1 36 (SUTURE) ×6 IMPLANT
SUT PROLENE 4 0 TF (SUTURE) ×6 IMPLANT
SUT PROLENE 5 0 C 1 36 (SUTURE) ×3 IMPLANT
SUT PROLENE 6 0 C 1 30 (SUTURE) ×6 IMPLANT
SUT PROLENE 6 0 CC (SUTURE) ×15 IMPLANT
SUT PROLENE 7 0 BV1 MDA (SUTURE) ×6 IMPLANT
SUT PROLENE 8 0 BV175 6 (SUTURE) ×12 IMPLANT
SUT STEEL 6MS V (SUTURE) ×3 IMPLANT
SUT STEEL SZ 6 DBL 3X14 BALL (SUTURE) ×3 IMPLANT
SUT VIC AB 1 CTX 18 (SUTURE) ×6 IMPLANT
SUT VIC AB 2-0 CT1 27 (SUTURE) ×1
SUT VIC AB 2-0 CT1 TAPERPNT 27 (SUTURE) ×2 IMPLANT
SYSTEM SAHARA CHEST DRAIN ATS (WOUND CARE) ×3 IMPLANT
TAPE CLOTH SURG 4X10 WHT LF (GAUZE/BANDAGES/DRESSINGS) ×3 IMPLANT
TAPE PAPER 2X10 WHT MICROPORE (GAUZE/BANDAGES/DRESSINGS) ×3 IMPLANT
TOWEL GREEN STERILE (TOWEL DISPOSABLE) ×3 IMPLANT
TOWEL GREEN STERILE FF (TOWEL DISPOSABLE) ×3 IMPLANT
TRAY FOLEY SILVER 16FR TEMP (SET/KITS/TRAYS/PACK) ×3 IMPLANT
TUBING INSUFFLATION (TUBING) ×3 IMPLANT
UNDERPAD 30X30 (UNDERPADS AND DIAPERS) ×3 IMPLANT
WATER STERILE IRR 1000ML POUR (IV SOLUTION) ×6 IMPLANT

## 2017-11-03 NOTE — Anesthesia Procedure Notes (Signed)
Arterial Line Insertion Start/End12/31/2018 7:10 AM Performed by: Valda Favia, CRNA, CRNA  Patient location: Pre-op. Lidocaine 1% used for infiltration Right, radial was placed Catheter size: 20 G Hand hygiene performed  and maximum sterile barriers used   Attempts: 1 (# attempts on Left by Vonda Antigua and R. Shameeka Silliman) Procedure performed without using ultrasound guided technique. Post procedure assessment: unchanged

## 2017-11-03 NOTE — Progress Notes (Signed)
  Echocardiogram Echocardiogram Transesophageal has been performed.  Abbigail Anstey T Jennavie Martinek 11/03/2017, 8:29 AM

## 2017-11-03 NOTE — Anesthesia Preprocedure Evaluation (Signed)
Anesthesia Evaluation  Patient identified by MRN, date of birth, ID band Patient awake    Reviewed: Allergy & Precautions, NPO status , Patient's Chart, lab work & pertinent test results  Airway Mallampati: II  TM Distance: >3 FB     Dental   Pulmonary former smoker,    breath sounds clear to auscultation       Cardiovascular + CAD   Rhythm:Regular Rate:Normal  History noted. CG   Neuro/Psych    GI/Hepatic Neg liver ROS, GERD  ,  Endo/Other  negative endocrine ROS  Renal/GU negative Renal ROS     Musculoskeletal   Abdominal   Peds  Hematology   Anesthesia Other Findings   Reproductive/Obstetrics                             Anesthesia Physical Anesthesia Plan  ASA: III  Anesthesia Plan: General   Post-op Pain Management:    Induction: Intravenous  PONV Risk Score and Plan: 3 and 2 and Treatment may vary due to age or medical condition and Midazolam  Airway Management Planned:   Additional Equipment:   Intra-op Plan:   Post-operative Plan: Post-operative intubation/ventilation  Informed Consent: I have reviewed the patients History and Physical, chart, labs and discussed the procedure including the risks, benefits and alternatives for the proposed anesthesia with the patient or authorized representative who has indicated his/her understanding and acceptance.   Dental advisory given  Plan Discussed with: CRNA and Anesthesiologist  Anesthesia Plan Comments:         Anesthesia Quick Evaluation

## 2017-11-03 NOTE — Transfer of Care (Signed)
Immediate Anesthesia Transfer of Care Note  Patient: Jeffrey Prince  Procedure(s) Performed: CORONARY ARTERY BYPASS GRAFTING (CABG) x four, using left internal mammry artery and right leg greater saphenous vein harvested endoscopically (LIMA to LAD, SVG to OM1, SVG to RAMUS INTERMEDIATE, SVG to PDA)   (N/A Chest) TRANSESOPHAGEAL ECHOCARDIOGRAM (TEE) (N/A )  Patient Location: PACU  Anesthesia Type:General  Level of Consciousness: Patient remains intubated per anesthesia plan  Airway & Oxygen Therapy: Patient remains intubated per anesthesia plan and Patient placed on Ventilator (see vital sign flow sheet for setting)  Post-op Assessment: Report given to RN and Post -op Vital signs reviewed and stable  Post vital signs: Reviewed and stable  Last Vitals:  Vitals:   11/02/17 2000 11/03/17 0420  BP: 128/81 118/78  Pulse: (!) 55 65  Resp: 16 16  Temp: 36.4 C 36.7 C  SpO2: 98% 100%    Last Pain:  Vitals:   11/03/17 0420  TempSrc: Oral  PainSc:          Complications: No apparent anesthesia complications

## 2017-11-03 NOTE — Brief Op Note (Addendum)
      [address]       [place] [address]             [phone]      11/03/2017  12:33 PM  PATIENT:  Jeffrey Prince  51 y.o. male  PRE-OPERATIVE DIAGNOSIS:  Coronary artery disease  POST-OPERATIVE DIAGNOSIS:  Coronary artery disease  PROCEDURE:  TRANSESOPHAGEAL ECHOCARDIOGRAM (TEE), MEDIAN STERNOTOMY for  CORONARY ARTERY BYPASS GRAFTING (CABG) x four (LIMA to LAD, SVG to OM1, SVG to RAMUS INTERMEDIATE, and SVG to PDA) using left internal mammry artery and right leg greater saphenous vein harvested endoscopically   SURGEON:  Surgeon(s) and Role:    Grace Isaac, MD - Primary  PHYSICIAN ASSISTANT: Lars Pinks PA-C  ASSISTANTS: Sharee Pimple RNFA  ANESTHESIA:   general  EBL:  200 mL   DRAINS: Chest tubes placed in the mediastinal and pleural spaces   COUNTS CORRECT:  YES  DICTATION: .Dragon Dictation  PLAN OF CARE: Admit to inpatient   PATIENT DISPOSITION:  ICU - intubated and hemodynamically stable.   Delay start of Pharmacological VTE agent (>24hrs) due to surgical blood loss or risk of bleeding: yes  BASELINE WEIGHT: 60 kg

## 2017-11-03 NOTE — Anesthesia Procedure Notes (Signed)
Procedure Name: Intubation Date/Time: 11/03/2017 7:48 AM Performed by: Izora Gala, CRNA Pre-anesthesia Checklist: Patient identified, Emergency Drugs available, Suction available and Patient being monitored Patient Re-evaluated:Patient Re-evaluated prior to induction Oxygen Delivery Method: Circle system utilized Preoxygenation: Pre-oxygenation with 100% oxygen Induction Type: IV induction Ventilation: Mask ventilation without difficulty Laryngoscope Size: Miller and 3 Grade View: Grade I Tube type: Oral Tube size: 7.5 mm Number of attempts: 1 Airway Equipment and Method: Stylet Placement Confirmation: ETT inserted through vocal cords under direct vision,  positive ETCO2 and breath sounds checked- equal and bilateral Secured at: 22 cm Tube secured with: Tape Dental Injury: Teeth and Oropharynx as per pre-operative assessment

## 2017-11-03 NOTE — Anesthesia Procedure Notes (Signed)
Anesthesia Procedure Image    

## 2017-11-03 NOTE — Procedures (Signed)
Extubation Procedure Note  Patient Details:   Name: Jeffrey Prince DOB: 11/03/1966 MRN: [id number]   Airway Documentation:     Evaluation  O2 sats: stable throughout Complications: No apparent complications Patient did tolerate procedure well. Bilateral Breath Sounds: Clear   Yes   Patient was extubated to a 4L [place] without any complications, dyspnea or stridor noted. Patient achieved -30 on NIF & 2.3L on VC. Patient was instructed on IS, highest goal reached was 1363mL.  Reilly Molchan, Eddie North 11/03/2017, 6:10 PM

## 2017-11-03 NOTE — Progress Notes (Signed)
Patient ID: Jeffrey Prince, male   DOB: October 11, 1966, 51 y.o.   MRN: [id number] EVENING ROUNDS NOTE :     [address]       [place] [address]             [phone]                 Day of Surgery Procedure(s) (LRB): CORONARY ARTERY BYPASS GRAFTING (CABG) x four, using left internal mammry artery and right leg greater saphenous vein harvested endoscopically (LIMA to LAD, SVG to OM1, SVG to RAMUS INTERMEDIATE, SVG to PDA)   (N/A) TRANSESOPHAGEAL ECHOCARDIOGRAM (TEE) (N/A)  Total Length of Stay:  LOS: 3 days  BP 120/88   Pulse 89   Temp (!) 96.3 F (35.7 C)   Resp 16   Ht 5\' 10"  (1.778 m)   Wt 132 lb 11.2 oz (60.2 kg)   SpO2 100%   BMI 19.04 kg/m   .Intake/Output      12/30 0701 - 12/31 0700 12/31 0701 - 01/01 0700   P.O. 720    I.V. (mL/kg)  2699.5 (44.8)   Blood  710   IV Piggyback  600   Total Intake(mL/kg) 720 (12) 4009.5 (66.6)   Urine (mL/kg/hr) 200 (0.1) 1875 (3.5)   Blood  1250   Chest Tube  100   Total Output 200 3225   Net +520 +784.5          . sodium chloride    . [START ON 11/04/2017] sodium chloride    . sodium chloride 20 mL/hr at 11/03/17 1500  . albumin human    . cefUROXime (ZINACEF)  IV    . dexmedetomidine (PRECEDEX) IV infusion 0.498 mcg/kg/hr (11/03/17 1500)  . DOPamine Stopped (11/03/17 1338)  . famotidine (PEPCID) IV Stopped (11/03/17 1400)  . insulin (NOVOLIN-R) infusion Stopped (11/03/17 1500)  . lactated ringers    . lactated ringers 20 mL/hr at 11/03/17 1519  . lactated ringers    . magnesium sulfate 4 g (11/03/17 1356)  . nitroGLYCERIN 5 mcg/min (11/03/17 1500)  . phenylephrine (NEO-SYNEPHRINE) Adult infusion Stopped (11/03/17 1339)  . potassium chloride 10 mEq (11/03/17 1450)  . vancomycin       Lab Results  Component Value Date   WBC 10.0 11/03/2017   HGB 11.6 (L) 11/03/2017   HCT 34.0 (L) 11/03/2017   PLT 127 (L) 11/03/2017   GLUCOSE 105 (H) 11/03/2017   CHOL 169 01/24/2015   TRIG 82 01/24/2015   HDL  37 (L) 01/24/2015   LDLCALC 116 (H) 01/24/2015   ALT 22 10/31/2017   AST 24 10/31/2017   NA 143 11/03/2017   K 3.3 (L) 11/03/2017   CL 104 11/03/2017   CREATININE 0.20 (L) 11/03/2017   BUN 6 11/03/2017   CO2 28 11/03/2017   TSH 1.252 10/30/2017   PSA 1.41 12/14/2015   INR 1.31 11/03/2017   HGBA1C 5.0 11/02/2017   Early postop Not bleeding Waking up , start weaning vent    Grace Isaac MD  Beeper [phone] Office [phone] 11/03/2017 3:48 PM

## 2017-11-03 NOTE — OR Nursing (Signed)
12:20 - 45 minute call to SICU charge nurse 

## 2017-11-03 NOTE — OR Nursing (Signed)
12:45 - 20 minute call to SICU charge nurse

## 2017-11-03 NOTE — Progress Notes (Signed)
      [address]       [place], [address]             [phone]     Pre Procedure note for inpatients:   Jeffrey Prince has been scheduled for Procedure(s): CORONARY ARTERY BYPASS GRAFTING (CABG) (N/A) TRANSESOPHAGEAL ECHOCARDIOGRAM (TEE) (N/A) today. The various methods of treatment have been discussed with the patient. After consideration of the risks, benefits and treatment options the patient has consented to the planned procedure.   The patient has been seen and labs reviewed. There are no changes in the patient's condition to prevent proceeding with the planned procedure today.  Recent labs:  Lab Results  Component Value Date   WBC 5.5 11/03/2017   HGB 13.5 11/03/2017   HCT 41.2 11/03/2017   PLT 231 11/03/2017   GLUCOSE 78 11/03/2017   CHOL 169 01/24/2015   TRIG 82 01/24/2015   HDL 37 (L) 01/24/2015   LDLCALC 116 (H) 01/24/2015   ALT 22 10/31/2017   AST 24 10/31/2017   NA 138 11/03/2017   K 4.0 11/03/2017   CL 104 11/03/2017   CREATININE 0.78 11/03/2017   BUN 13 11/03/2017   CO2 28 11/03/2017   TSH 1.252 10/30/2017   PSA 1.41 12/14/2015   INR 1.01 11/02/2017   HGBA1C 5.0 11/02/2017    Grace Isaac, MD 11/03/2017 7:26 AM

## 2017-11-03 NOTE — Anesthesia Procedure Notes (Addendum)
Central Venous Catheter Insertion Performed by: Myrtie Soman, MD, anesthesiologist Start/End12/31/2018 6:32 AM, 11/03/2017 6:45 AM Patient location: Pre-op. Preanesthetic checklist: patient identified, IV checked, site marked, risks and benefits discussed, surgical consent, monitors and equipment checked, pre-op evaluation, timeout performed and anesthesia consent Position: Trendelenburg Hand hygiene performed  and maximum sterile barriers used  Catheter size: 8.5 Fr PA cath was placed.Swan type:thermodilution PA Cath depth:44 Procedure performed without using ultrasound guided technique. Ultrasound Notes:anatomy identified, needle tip was noted to be adjacent to the nerve/plexus identified, no ultrasound evidence of intravascular and/or intraneural injection and image(s) printed for medical record Attempts: 1 Following insertion, line sutured, dressing applied and Biopatch. Post procedure assessment: blood return through all ports, free fluid flow and no air  Patient tolerated the procedure well with no immediate complications.

## 2017-11-03 NOTE — Anesthesia Postprocedure Evaluation (Signed)
Anesthesia Post Note  Patient: ANATOLE APOLLO  Procedure(s) Performed: CORONARY ARTERY BYPASS GRAFTING (CABG) x four, using left internal mammry artery and right leg greater saphenous vein harvested endoscopically (LIMA to LAD, SVG to OM1, SVG to RAMUS INTERMEDIATE, SVG to PDA)   (N/A Chest) TRANSESOPHAGEAL ECHOCARDIOGRAM (TEE) (N/A )     Patient location during evaluation: PACU Anesthesia Type: General Level of consciousness: awake Pain management: pain level controlled Vital Signs Assessment: post-procedure vital signs reviewed and stable Respiratory status: spontaneous breathing Cardiovascular status: stable Anesthetic complications: no    Last Vitals:  Vitals:   11/03/17 1705 11/03/17 1728  BP: 114/88 114/88  Pulse: 90 90  Resp:  18  Temp:    SpO2: 100% 100%    Last Pain:  Vitals:   11/03/17 0420  TempSrc: Oral  PainSc:                  Karol Liendo

## 2017-11-04 ENCOUNTER — Inpatient Hospital Stay (HOSPITAL_COMMUNITY): Payer: BLUE CROSS/BLUE SHIELD

## 2017-11-04 LAB — GLUCOSE, CAPILLARY
Glucose-Capillary: 101 mg/dL — ABNORMAL HIGH (ref 65–99)
Glucose-Capillary: 88 mg/dL (ref 65–99)
Glucose-Capillary: 107 mg/dL — ABNORMAL HIGH (ref 65–99)
Glucose-Capillary: 155 mg/dL — ABNORMAL HIGH (ref 65–99)
Glucose-Capillary: 158 mg/dL — ABNORMAL HIGH (ref 65–99)

## 2017-11-04 LAB — BASIC METABOLIC PANEL
Anion gap: 8 (ref 5–15)
BUN: 7 mg/dL (ref 6–20)
CHLORIDE: 105 mmol/L (ref 101–111)
CO2: 20 mmol/L — AB (ref 22–32)
Calcium: 8.3 mg/dL — ABNORMAL LOW (ref 8.9–10.3)
Creatinine, Ser: 0.65 mg/dL (ref 0.61–1.24)
GFR calc Af Amer: >60 mL/min (ref >60)
GFR calc non Af Amer: >60 mL/min (ref >60)
GLUCOSE: 104 mg/dL — AB (ref 65–99)
POTASSIUM: 4.3 mmol/L (ref 3.5–5.1)
Sodium: 133 mmol/L — ABNORMAL LOW (ref 135–145)

## 2017-11-04 LAB — CBC
HCT: 36 % — ABNORMAL LOW (ref 39.0–52.0)
HEMATOCRIT: 35.9 % — AB (ref 39.0–52.0)
HEMOGLOBIN: 12 g/dL — AB (ref 13.0–17.0)
HEMOGLOBIN: 11.8 g/dL — AB (ref 13.0–17.0)
MCH: 28.7 pg (ref 26.0–34.0)
MCH: 28.8 pg (ref 26.0–34.0)
MCHC: 33.3 g/dL (ref 30.0–36.0)
MCHC: 32.9 g/dL (ref 30.0–36.0)
MCV: 86.1 fL (ref 78.0–100.0)
MCV: 87.6 fL (ref 78.0–100.0)
Platelets: 152 10*3/uL (ref 150–400)
Platelets: 147 10*3/uL — ABNORMAL LOW (ref 150–400)
RBC: 4.18 MIL/uL — AB (ref 4.22–5.81)
RBC: 4.1 MIL/uL — AB (ref 4.22–5.81)
RDW: 13.6 % (ref 11.5–15.5)
RDW: 13.6 % (ref 11.5–15.5)
WBC: 12 10*3/uL — ABNORMAL HIGH (ref 4.0–10.5)
WBC: 10.6 10*3/uL — ABNORMAL HIGH (ref 4.0–10.5)

## 2017-11-04 LAB — POCT I-STAT, CHEM 8
BUN: 8 mg/dL (ref 6–20)
Calcium, Ion: 1.16 mmol/L (ref 1.15–1.40)
Chloride: 107 mmol/L (ref 101–111)
Creatinine, Ser: 0.4 mg/dL — ABNORMAL LOW (ref 0.61–1.24)
Glucose, Bld: 178 mg/dL — ABNORMAL HIGH (ref 65–99)
HCT: 38 % — ABNORMAL LOW (ref 39.0–52.0)
Hemoglobin: 12.9 g/dL — ABNORMAL LOW (ref 13.0–17.0)
Potassium: 4.6 mmol/L (ref 3.5–5.1)
Sodium: 140 mmol/L (ref 135–145)
TCO2: 23 mmol/L (ref 22–32)

## 2017-11-04 LAB — CREATININE, SERUM
Creatinine, Ser: 0.73 mg/dL (ref 0.61–1.24)
GFR calc Af Amer: >60 mL/min (ref >60)
GFR calc non Af Amer: >60 mL/min (ref >60)

## 2017-11-04 LAB — MAGNESIUM
MAGNESIUM: 2.1 mg/dL (ref 1.7–2.4)
Magnesium: 2 mg/dL (ref 1.7–2.4)

## 2017-11-04 MED ORDER — ENOXAPARIN SODIUM 30 MG/0.3ML ~~LOC~~ SOLN
30.0000 mg | Freq: Every day | SUBCUTANEOUS | Status: DC
  Administered 2017-11-04 – 2017-11-07 (×4): 30 mg via SUBCUTANEOUS
  Filled 2017-11-04 (×5): qty 0.3

## 2017-11-04 MED ORDER — INSULIN ASPART 100 UNIT/ML ~~LOC~~ SOLN
0.0000 [IU] | SUBCUTANEOUS | Status: DC
  Administered 2017-11-04 (×2): 2 [IU] via SUBCUTANEOUS

## 2017-11-04 MED ORDER — ORAL CARE MOUTH RINSE
15.0000 mL | Freq: Two times a day (BID) | OROMUCOSAL | Status: DC
  Administered 2017-11-05 – 2017-11-07 (×4): 15 mL via OROMUCOSAL

## 2017-11-04 NOTE — Progress Notes (Signed)
EKG CRITICAL VALUE     12 lead EKG performed.  Critical value noted.  Lonzo Cloud, RN notified.   Marilynne Halsted, CCT 11/04/2017 8:08 AM

## 2017-11-04 NOTE — Progress Notes (Signed)
Patient ID: Jeffrey Prince, male   DOB: [birth year], 52 y.o.   MRN: [id number] T[facility] DAILY ICU PROGRESS NOTE                   [address]            [place] [address]          [phone]   1 Day Post-Op Procedure(s) (LRB): CORONARY ARTERY BYPASS GRAFTING (CABG) x four, using left internal mammry artery and right leg greater saphenous vein harvested endoscopically (LIMA to LAD, SVG to OM1, SVG to RAMUS INTERMEDIATE, SVG to PDA)   (N/A) TRANSESOPHAGEAL ECHOCARDIOGRAM (TEE) (N/A)  Total Length of Stay:  LOS: 4 days   Subjective: Patient extubated last night, awake alert neurologically intact  Objective: Vital signs in last 24 hours: Temp:  [96.3 F (35.7 C)-99.5 F (37.5 C)] 99.1 F (37.3 C) (01/01 0700) Pulse Rate:  [65-90] 65 (01/01 0700) Cardiac Rhythm: Atrial paced (01/01 0000) Resp:  [9-26] 18 (01/01 0700) BP: (99-129)/(64-98) 99/77 (01/01 0700) SpO2:  [97 %-100 %] 100 % (01/01 0700) Arterial Line BP: (101-151)/(65-83) 131/65 (01/01 0700) FiO2 (%):  [40 %-50 %] 40 % (12/31 1728) Weight:  [142 lb 3.2 oz (64.5 kg)] 142 lb 3.2 oz (64.5 kg) (01/01 0500)  Filed Weights   11/02/17 0412 11/03/17 0420 11/04/17 0500  Weight: 134 lb 14.7 oz (61.2 kg) 132 lb 11.2 oz (60.2 kg) 142 lb 3.2 oz (64.5 kg)    Weight change: 9 lb 8 oz (4.308 kg)   Hemodynamic parameters for last 24 hours: PAP: (12-18)/(4-11) 12/8 CO:  [3.5 L/min-5.2 L/min] 5.2 L/min CI:  [2 L/min/m2-3 L/min/m2] 3 L/min/m2  Intake/Output from previous day: 12/31 0701 - 01/01 0700 In: 5631.6 [P.O.:840; I.V.:3381.6; Blood:710; IV Piggyback:700] Out: 6301 [Urine:3460; Blood:1250; Chest Tube:250]  Intake/Output this shift: Total I/O In: 116.2 [I.V.:116.2] Out: 20 [Urine:20]  Current Meds: Scheduled Meds: . acetaminophen  1,000 mg Oral Q6H   Or  . acetaminophen (TYLENOL) oral liquid 160 mg/5 mL  1,000 mg Per Tube Q6H  . aspirin EC  325 mg Oral Daily   Or  . aspirin  324 mg Per Tube Daily    . atorvastatin  20 mg Oral Daily  . bisacodyl  10 mg Oral Daily   Or  . bisacodyl  10 mg Rectal Daily  . chlorhexidine gluconate (MEDLINE KIT)  15 mL Mouth Rinse BID  . Chlorhexidine Gluconate Cloth  6 each Topical Daily  . docusate sodium  200 mg Oral Daily  . famotidine  10 mg Oral Daily  . feeding supplement (ENSURE ENLIVE)  237 mL Oral BID BM  . insulin aspart  0-24 Units Subcutaneous Q4H  . insulin regular  0-10 Units Intravenous TID WC  . mouth rinse  15 mL Mouth Rinse QID  . metoprolol tartrate  12.5 mg Oral BID   Or  . metoprolol tartrate  12.5 mg Per Tube BID  . sodium chloride flush  10-40 mL Intracatheter Q12H  . sodium chloride flush  3 mL Intravenous Q12H   Continuous Infusions: . sodium chloride    . sodium chloride 250 mL (11/04/17 0800)  . sodium chloride 20 mL/hr at 11/04/17 0800  . albumin human    . cefUROXime (ZINACEF)  IV 1.5 g (11/04/17 0848)  . dexmedetomidine (PRECEDEX) IV infusion Stopped (11/03/17 1628)  . DOPamine Stopped (11/03/17 1338)  . insulin (NOVOLIN-R) infusion Stopped (11/03/17 1500)  . lactated ringers    .  lactated ringers 20 mL/hr at 11/04/17 0800  . lactated ringers    . nitroGLYCERIN 60 mcg/min (11/04/17 0848)  . phenylephrine (NEO-SYNEPHRINE) Adult infusion Stopped (11/03/17 1339)   PRN Meds:.sodium chloride, albumin human, hyoscyamine, lactated ringers, metoprolol tartrate, midazolam, morphine injection, ondansetron (ZOFRAN) IV, sodium chloride flush, sodium chloride flush, traMADol  General appearance: alert, cooperative and no distress Neurologic: intact Heart: regular rate and rhythm, S1, S2 normal, no murmur, click, rub or gallop Lungs: diminished breath sounds bibasilar Abdomen: soft, non-tender; bowel sounds normal; no masses,  no organomegaly Extremities: extremities normal, atraumatic, no cyanosis or edema and Homans sign is negative, no sign of DVT Wound: Sternum stable  Lab Results: CBC: Recent Labs    11/03/17 1900  11/04/17 0400  WBC 10.1 12.0*  HGB 12.7* 12.0*  HCT 37.9* 36.0*  PLT 143* 152   BMET:  Recent Labs    11/03/17 0006  11/03/17 1214 11/03/17 1328 11/03/17 1900 11/04/17 0400  NA 138   < > 142 143  --  133*  K 4.0   < > 3.2* 3.3*  --  4.3  CL 104   < > 104  --   --  105  CO2 28  --   --   --   --  20*  GLUCOSE 78   < > 127* 105*  --  104*  BUN 13   < > 6  --   --  7  CREATININE 0.78   < > 0.20*  --  0.61 0.65  CALCIUM 9.2  --   --   --   --  8.3*   < > = values in this interval not displayed.    CMET: Lab Results  Component Value Date   WBC 12.0 (H) 11/04/2017   HGB 12.0 (L) 11/04/2017   HCT 36.0 (L) 11/04/2017   PLT 152 11/04/2017   GLUCOSE 104 (H) 11/04/2017   CHOL 169 01/24/2015   TRIG 82 01/24/2015   HDL 37 (L) 01/24/2015   LDLCALC 116 (H) 01/24/2015   ALT 22 10/31/2017   AST 24 10/31/2017   NA 133 (L) 11/04/2017   K 4.3 11/04/2017   CL 105 11/04/2017   CREATININE 0.65 11/04/2017   BUN 7 11/04/2017   CO2 20 (L) 11/04/2017   TSH 1.252 10/30/2017   PSA 1.41 12/14/2015   INR 1.31 11/03/2017   HGBA1C 5.0 11/02/2017      PT/INR:  Recent Labs    11/03/17 1320  LABPROT 16.2*  INR 1.31   Radiology: Dg Chest Port 1 View  Result Date: 11/04/2017 CLINICAL DATA:  Status post CABG EXAM: PORTABLE CHEST 1 VIEW COMPARISON:  Chest radiograph from one day prior. FINDINGS: Right internal jugular Swan-Ganz catheter terminates over the main pulmonary artery. Intact sternotomy wires. Stable left chest tube. Stable mediastinal drain. Stable cardiomediastinal silhouette with normal heart size. No pneumothorax. No pleural effusion. Mild left basilar atelectasis, slightly increased. No pulmonary edema. No acute consolidative airspace disease. IMPRESSION: 1. No pneumothorax. 2. Mild left basilar atelectasis, slightly increased. Electronically Signed   By: Ilona Sorrel M.D.   On: 11/04/2017 07:57   Dg Chest Port 1 View  Result Date: 11/03/2017 CLINICAL DATA:  Post CABG EXAM:  PORTABLE CHEST 1 VIEW COMPARISON:  Portable exam 1344 hours compared to 10/31/2017 FINDINGS: Tip of endotracheal tube projects approximately 5.1 cm above carina. Nasogastric tube coiled in proximal stomach. Epicardial pacing leads noted. RIGHT jugular Swan-Ganz catheter tip projects over main pulmonary artery near bifurcation.  Mediastinal drain and LEFT thoracostomy tube noted. Normal heart size post CABG. Mediastinal contours and pulmonary vascularity normal. No definite pneumothorax. IMPRESSION: Line tube positions as above. Postoperative changes without acute abnormality. Electronically Signed   By: Lavonia Dana M.D.   On: 11/03/2017 14:06     Assessment/Plan: S/P Procedure(s) (LRB): CORONARY ARTERY BYPASS GRAFTING (CABG) x four, using left internal mammry artery and right leg greater saphenous vein harvested endoscopically (LIMA to LAD, SVG to OM1, SVG to RAMUS INTERMEDIATE, SVG to PDA)   (N/A) TRANSESOPHAGEAL ECHOCARDIOGRAM (TEE) (N/A) Mobilize Diuresis d/c tubes/lines See progression orders Expected Acute  Blood - loss Anemia- continue to monitor  Renal function stable    Grace Isaac 11/04/2017 8:55 AM

## 2017-11-05 ENCOUNTER — Inpatient Hospital Stay (HOSPITAL_COMMUNITY): Payer: BLUE CROSS/BLUE SHIELD

## 2017-11-05 ENCOUNTER — Encounter (HOSPITAL_COMMUNITY): Payer: Self-pay | Admitting: Cardiothoracic Surgery

## 2017-11-05 LAB — GLUCOSE, CAPILLARY
Glucose-Capillary: 112 mg/dL — ABNORMAL HIGH (ref 65–99)
Glucose-Capillary: 118 mg/dL — ABNORMAL HIGH (ref 65–99)
Glucose-Capillary: 121 mg/dL — ABNORMAL HIGH (ref 65–99)
Glucose-Capillary: 144 mg/dL — ABNORMAL HIGH (ref 65–99)
Glucose-Capillary: 80 mg/dL (ref 65–99)
Glucose-Capillary: 96 mg/dL (ref 65–99)

## 2017-11-05 LAB — CBC
HCT: 35.2 % — ABNORMAL LOW (ref 39.0–52.0)
Hemoglobin: 11.5 g/dL — ABNORMAL LOW (ref 13.0–17.0)
MCH: 28.7 pg (ref 26.0–34.0)
MCHC: 32.7 g/dL (ref 30.0–36.0)
MCV: 87.8 fL (ref 78.0–100.0)
Platelets: 137 10*3/uL — ABNORMAL LOW (ref 150–400)
RBC: 4.01 MIL/uL — ABNORMAL LOW (ref 4.22–5.81)
RDW: 13.6 % (ref 11.5–15.5)
WBC: 8.7 10*3/uL (ref 4.0–10.5)

## 2017-11-05 LAB — BASIC METABOLIC PANEL
Anion gap: 5 (ref 5–15)
BUN: 6 mg/dL (ref 6–20)
CO2: 26 mmol/L (ref 22–32)
Calcium: 8.7 mg/dL — ABNORMAL LOW (ref 8.9–10.3)
Chloride: 105 mmol/L (ref 101–111)
Creatinine, Ser: 0.65 mg/dL (ref 0.61–1.24)
GFR calc Af Amer: >60 mL/min (ref >60)
GFR calc non Af Amer: >60 mL/min (ref >60)
Glucose, Bld: 115 mg/dL — ABNORMAL HIGH (ref 65–99)
Potassium: 4.1 mmol/L (ref 3.5–5.1)
Sodium: 136 mmol/L (ref 135–145)

## 2017-11-05 MED ORDER — SODIUM CHLORIDE 0.9% FLUSH
3.0000 mL | Freq: Two times a day (BID) | INTRAVENOUS | Status: DC
  Administered 2017-11-05 – 2017-11-06 (×2): 3 mL via INTRAVENOUS

## 2017-11-05 MED ORDER — BISACODYL 10 MG RE SUPP: 10.0000 mg | Freq: Every day | RECTAL | Status: DC | PRN

## 2017-11-05 MED ORDER — MOVING RIGHT ALONG BOOK
Freq: Once | Status: AC
  Administered 2017-11-05: 1
  Filled 2017-11-05: qty 1

## 2017-11-05 MED ORDER — TRAMADOL HCL 50 MG PO TABS
50.0000 mg | ORAL_TABLET | ORAL | Status: DC | PRN
  Administered 2017-11-05 (×3): 100 mg via ORAL
  Filled 2017-11-05 (×3): qty 2

## 2017-11-05 MED ORDER — ACETAMINOPHEN 325 MG PO TABS
650.0000 mg | ORAL_TABLET | Freq: Four times a day (QID) | ORAL | Status: DC | PRN
  Administered 2017-11-07: 650 mg via ORAL

## 2017-11-05 MED ORDER — SODIUM CHLORIDE 0.9% FLUSH: 3.0000 mL | INTRAVENOUS | Status: DC | PRN

## 2017-11-05 MED ORDER — ATORVASTATIN CALCIUM 40 MG PO TABS
40.0000 mg | ORAL_TABLET | Freq: Every day | ORAL | Status: DC
  Administered 2017-11-05 – 2017-11-08 (×4): 40 mg via ORAL
  Filled 2017-11-05 (×4): qty 1

## 2017-11-05 MED ORDER — ASPIRIN EC 325 MG PO TBEC
325.0000 mg | DELAYED_RELEASE_TABLET | Freq: Every day | ORAL | Status: DC
  Administered 2017-11-05 – 2017-11-08 (×4): 325 mg via ORAL
  Filled 2017-11-05 (×4): qty 1

## 2017-11-05 MED ORDER — SODIUM CHLORIDE 0.9 % IV SOLN: 250.0000 mL | INTRAVENOUS | Status: DC | PRN

## 2017-11-05 MED ORDER — INSULIN ASPART 100 UNIT/ML ~~LOC~~ SOLN
0.0000 [IU] | Freq: Three times a day (TID) | SUBCUTANEOUS | Status: DC
  Administered 2017-11-05 – 2017-11-06 (×2): 2 [IU] via SUBCUTANEOUS

## 2017-11-05 MED ORDER — ONDANSETRON HCL 4 MG/2ML IJ SOLN: 4.0000 mg | Freq: Four times a day (QID) | INTRAMUSCULAR | Status: DC | PRN

## 2017-11-05 MED ORDER — BISACODYL 5 MG PO TBEC
10.0000 mg | DELAYED_RELEASE_TABLET | Freq: Every day | ORAL | Status: DC | PRN
  Administered 2017-11-05: 10 mg via ORAL
  Filled 2017-11-05: qty 2

## 2017-11-05 MED ORDER — ONDANSETRON HCL 4 MG PO TABS: 4.0000 mg | ORAL_TABLET | Freq: Four times a day (QID) | ORAL | Status: DC | PRN

## 2017-11-05 MED FILL — Magnesium Sulfate Inj 50%: INTRAMUSCULAR | Qty: 10 | Status: AC

## 2017-11-05 MED FILL — Potassium Chloride Inj 2 mEq/ML: INTRAVENOUS | Qty: 20 | Status: AC

## 2017-11-05 MED FILL — Heparin Sodium (Porcine) Inj 1000 Unit/ML: INTRAMUSCULAR | Qty: 30 | Status: AC

## 2017-11-05 NOTE — Discharge Summary (Signed)
Physician Discharge Summary       [address]       [place] [address]             [phone]    Patient ID: Jeffrey Prince MRN: [id number] DOB/AGE: 1966/01/24 52 y.o.  Admit date: 10/31/2017 Discharge date: 11/08/2017  Admission Diagnoses: 1. ACS (acute coronary syndrome) ([place]) 2. Coronary artery disease  Active Diagnoses:  1. Blind left eye (since birth) 2. Acne 3. Chorea 4. Diverticulosis 5. ED (erectile dysfunction) 6. GERD (gastroesophageal reflux disease) 7. Hemorrhoids 8. Tobacco abuse 9. ABL anemia 10. Mild thrombocytopenia   Procedure (s):  TRANSESOPHAGEAL ECHOCARDIOGRAM (TEE), MEDIAN STERNOTOMY for CORONARY ARTERY BYPASS GRAFTING (CABG) x four (LIMA to LAD, SVG to OM1, SVG to RAMUS INTERMEDIATE, and SVG to PDA) using left internal mammry artery and right leg greater saphenous vein harvested endoscopically by Dr. Servando Snare on 11/03/2017.  History of Presenting Illness: This is a 52 year old male patient with a past medical history of dyslipidemia, tobacco abuse, family history of huntington's disease (pt unsure if he has this) and GERD who presented to the emergency department with a complaint of chest pain.  The patient states that his chest pain is sharp and left-sided.  It does not radiate.  He does have shortness of breath with exertion.  He has had some fatigue and weight loss over the last couple months.  Cardiac enzymes have been negative.  A cardiac catheterization was performed which showed proximal LAD disease which is 90% stenosed, ostial first diagonal lesion which is 70% stenosed, mid RCA lesion which is 95% stenosed, Ost 1st Mrg lesion which  is 70% stenosed and the distal left main into ostial LAD lesion which is 70% stenosed.    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  He has had several issues with diarrhea, diverticulosis, hemorrhoids, and anal fissures which he has been treated.  He has noted  increased fatigue and weight loss over the last several months.  Dr. Servando Snare discussed the need for coronary artery bypass grafting surgery. Potential risks, benefits, and complications of the surgery were discussed with the patient and he agreed to proceed with surgery. Pre operative carotid duplex US showed no significant internal carotid artery stenosis bilaterally. He underwent a CABG x 4 on 11/03/2017.   Brief Hospital Course:  The patient was extubated the evening of surgery without difficulty. He remained afebrile and hemodynamically stable. He was weaned off Nitroglycerin drip. Gordy Councilman, a line, chest tubes, and foley were removed early in the post operative course. Lopressor was started and titrated accordingly. He was volume over loaded and diuresed. He had ABL anemia. He did not require a post op transfusion. Last H and H was 11.1/34.7. He also had mild thrombocytopenia. His last platelet count was 153. He was weaned off the insulin drip.  Once he was tolerating a diet, home diabetic medicines were restarted.  The patient's glucose remained well controlled.The patient's HGA1C pre op was 4.9. The patient was felt surgically stable for transfer from the ICU to [facility]U for further convalescence on 11/05/2017. He continues to progress with cardiac rehab. He was ambulating on room air. He has been tolerating a diet and has had a bowel movement. Epicardial pacing wires were removed on 11/06/2016. Chest tube sutures will be removed the day of discharge. The patient is felt surgically stable for discharge today.   Latest Vital Signs: Blood pressure 108/77, pulse 69, temperature  99.1 F (37.3 C), temperature source Oral, resp. rate 16, height 5\' 10"  (1.778 m), weight 129 lb 14.4 oz (58.9 kg), SpO2 96 %.  Physical Exam: General appearance: alert and cooperative Neurologic: intact Heart: regular rate and rhythm, S1, S2 normal, no murmur, click, rub or gallop Lungs: clear to auscultation  bilaterally Abdomen: soft, non-tender; bowel sounds normal; no masses,  no organomegaly Extremities: extremities normal, atraumatic, no cyanosis or edema and Homans sign is negative, no sign of DVT Wound: Sternum stable   Discharge Condition:Stable and discharged to home.  Recent laboratory studies:  Lab Results  Component Value Date   WBC 8.4 11/06/2017   HGB 11.1 (L) 11/06/2017   HCT 34.7 (L) 11/06/2017   MCV 86.8 11/06/2017   PLT 153 11/06/2017   Lab Results  Component Value Date   NA 133 (L) 11/06/2017   K 3.6 11/06/2017   CL 100 (L) 11/06/2017   CO2 25 11/06/2017   CREATININE 0.49 (L) 11/06/2017   GLUCOSE 103 (H) 11/06/2017    Diagnostic Studies: Dg Chest 2 View  Result Date: 11/06/2017 CLINICAL DATA:  Patient status post CABG procedure. EXAM: CHEST  2 VIEW COMPARISON:  Chest radiograph 11/05/2017. FINDINGS: Monitoring leads overlie the patient. Stable cardiac and mediastinal contours status post median sternotomy. Interval removal left chest tube. Minimal heterogeneous opacities lung bases bilaterally. Small bilateral pleural effusions. No definite pneumothorax. Thoracic spine degenerative changes. IMPRESSION: Interval removal left chest tube.  No definite pneumothorax. Small bilateral pleural effusions with basilar opacities favored to represent atelectasis. Electronically Signed   By: Lovey Newcomer M.D.   On: 11/06/2017 09:20   X-ray Chest Pa And Lateral  Result Date: 10/31/2017 CLINICAL DATA:  Preoperative evaluation, history smoker, GERD EXAM: CHEST  2 VIEW COMPARISON:  10/29/2017 FINDINGS: Normal heart size, mediastinal contours, and pulmonary vascularity. Lungs remain hyperinflated but clear. No pulmonary infiltrate, pleural effusion or pneumothorax. Old BILATERAL rib fractures. IMPRESSION: Hyperinflated lungs without acute abnormality. Electronically Signed   By: Lavonia Dana M.D.   On: 10/31/2017 14:42   Dg Chest 2 View  Result Date: 10/29/2017 CLINICAL DATA:  Chest  pain EXAM: CHEST  2 VIEW COMPARISON:  01/12/2016 FINDINGS: Mild hyperinflation appearing no focal pneumonia, collapse or consolidation. Normal heart size and vascularity. Healed rib fractures bilaterally. Negative for edema, effusion or pneumothorax. Trachea is midline. Postop changes of the left shoulder. IMPRESSION: Stable hyperinflation without acute chest process. Electronically Signed   By: Jerilynn Mages.  Shick M.D.   On: 10/29/2017 18:50   Dg Chest Port 1 View  Result Date: 11/05/2017 CLINICAL DATA:  52 year old male with a history of cardiac surgery EXAM: PORTABLE CHEST 1 VIEW COMPARISON:  11/04/2017 FINDINGS: Cardiomediastinal silhouette unchanged in size and contour. Surgical changes of median sternotomy and CABG. Interval removal of the Swan-Ganz catheter, with unchanged position of right IJ sheath. Unchanged position of large bore left-sided thoracostomy tube. Epicardial pacing leads project over the low mediastinum/upper midline abdomen. Low lung volumes with linear atelectasis. No pneumothorax. No pleural effusion. IMPRESSION: Surgical changes of prior median sternotomy and CABG with low lung volumes and likely atelectasis. Interval removal of the Swan-Ganz catheter and mediastinal/ pleural drains. Single left sided large-bore thoracostomy tube remains in place with no evidence of pneumothorax. Electronically Signed   By: Corrie Mckusick D.O.   On: 11/05/2017 07:34   Dg Chest Port 1 View  Result Date: 11/04/2017 CLINICAL DATA:  Status post CABG EXAM: PORTABLE CHEST 1 VIEW COMPARISON:  Chest radiograph from one day prior. FINDINGS:  Right internal jugular Swan-Ganz catheter terminates over the main pulmonary artery. Intact sternotomy wires. Stable left chest tube. Stable mediastinal drain. Stable cardiomediastinal silhouette with normal heart size. No pneumothorax. No pleural effusion. Mild left basilar atelectasis, slightly increased. No pulmonary edema. No acute consolidative airspace disease. IMPRESSION: 1.  No pneumothorax. 2. Mild left basilar atelectasis, slightly increased. Electronically Signed   By: Ilona Sorrel M.D.   On: 11/04/2017 07:57   Dg Chest Port 1 View  Result Date: 11/03/2017 CLINICAL DATA:  Post CABG EXAM: PORTABLE CHEST 1 VIEW COMPARISON:  Portable exam 1344 hours compared to 10/31/2017 FINDINGS: Tip of endotracheal tube projects approximately 5.1 cm above carina. Nasogastric tube coiled in proximal stomach. Epicardial pacing leads noted. RIGHT jugular Swan-Ganz catheter tip projects over main pulmonary artery near bifurcation. Mediastinal drain and LEFT thoracostomy tube noted. Normal heart size post CABG. Mediastinal contours and pulmonary vascularity normal. No definite pneumothorax. IMPRESSION: Line tube positions as above. Postoperative changes without acute abnormality. Electronically Signed   By: Lavonia Dana M.D.   On: 11/03/2017 14:06     Discharge Medications: Allergies as of 11/08/2017      Reactions   Bee Venom Swelling   SWELLING REACTION UNSPECIFIED  SEVERITY RATED HIGH FROM PMH   Codeine Diarrhea, Nausea Only, Rash   Sulfa Antibiotics Nausea And Vomiting   INCLUDES SPECIFICALLY SULFASALAZINE      Medication List    STOP taking these medications   hydrocortisone 2.5 % rectal cream Commonly known as:  ANUSOL-HC   isosorbide dinitrate 30 MG tablet Commonly known as:  ISORDIL   LIDOCIN 3 % Gel Generic drug:  Lidocaine HCl   tadalafil 10 MG tablet Commonly known as:  CIALIS     TAKE these medications   acetaminophen 325 MG tablet Commonly known as:  TYLENOL Take 2 tablets (650 mg total) by mouth every 6 (six) hours as needed for mild pain.   aspirin 325 MG EC tablet Take 1 tablet (325 mg total) by mouth daily.   atorvastatin 40 MG tablet Commonly known as:  LIPITOR Take 1 tablet (40 mg total) by mouth daily. What changed:  how much to take   furosemide 40 MG tablet Commonly known as:  LASIX Take 1 tablet (40 mg total) by mouth daily for 5  days.   hyoscyamine 0.125 MG Tbdp disintergrating tablet Commonly known as:  ANASPAZ Place 0.125 mg under the tongue every 4 (four) hours as needed for cramping.   metoprolol tartrate 25 MG tablet Commonly known as:  LOPRESSOR Take 0.5 tablets (12.5 mg total) by mouth 2 (two) times daily.   potassium chloride SA 20 MEQ tablet Commonly known as:  K-DUR,KLOR-CON Take 1 tablet (20 mEq total) by mouth daily for 5 days.   ranitidine 150 MG tablet Commonly known as:  ZANTAC Take 1 tablet (150 mg total) by mouth 2 (two) times daily.   simethicone 80 MG chewable tablet Commonly known as:  MYLICON Chew 1 tablet (80 mg total) by mouth 4 (four) times daily as needed for flatulence.   traMADol 50 MG tablet Commonly known as:  ULTRAM Take 1 tablet (50 mg total) by mouth every 6 (six) hours as needed for moderate pain.            Durable Medical Equipment  (From admission, onward)        Start     Ordered   11/07/17 0741  For home use only DME Walker rolling  Once    Question:  Patient needs a walker to treat with the following condition  Answer:  Physical deconditioning   11/07/17 0743     The patient has been discharged on:   1.Beta Blocker:  Yes [  x ]                              No   [   ]                              If No, reason:  2.Ace Inhibitor/ARB: Yes [   ]                                     No  [  x  ]                                     If No, reason: labile BP  3.Statin:   Yes [  x ]                  No  [   ]                  If No, reason:  4.Ecasa:  Yes  [  x ]                  No   [   ]                  If No, reason:  Follow Up Appointments: Follow-up Information    Dionisio David, MD. Call.   Specialty:  Cardiology Why:  for a follow up appointment for 2 weeks Contact information: [place] [address] [phone]        Grace Isaac, MD. Go on 12/11/2017.   Specialty:  Cardiothoracic Surgery Why:  PA/LAT CXR to  be taken (at [facility] which is in the same building as Dr. Everrett Coombe office) on 12/11/2017 at 4:00 pm;Appointment time is at 4:30 pm Contact information: [address] [place] [address] [place] Follow up.   Why:  rolling walker arranged- to be delivered to room prior to discharge.  Contact information: [address] [place] [address] [phone]        Care, [facility] Follow up.   Specialty:  [place] Why:  HHRN/PT arranged- they will call you to set up home visits starting with PT Contact information: [place] [address] [phone]           Signed: Terance Hart ContePA-C 11/08/2017, 8:24 AM

## 2017-11-05 NOTE — Progress Notes (Signed)
Pt has ambulated a total of 3 times today. Ambulated with Suzane Vanderweide on room air. Pt tolerated well.   Fritz Pickerel, RN

## 2017-11-05 NOTE — Progress Notes (Signed)
Pt received from [place]. Family at bedside. VSS. Telemetry applied, CCMD notified x2. Pt given pain medication per request. Denies additional needs. Call bell within reach, will continue to monitor.   Fritz Pickerel, RN

## 2017-11-05 NOTE — Discharge Instructions (Signed)

## 2017-11-05 NOTE — Progress Notes (Signed)
Patient ID: SHANTANU STRAUCH, male   DOB: [birth year], 52 y.o.   MRN: [id number]  TCTS DAILY ICU PROGRESS NOTE                   [address]            [place] [address]          [phone]   2 Days Post-Op Procedure(s) (LRB): CORONARY ARTERY BYPASS GRAFTING (CABG) x four, using left internal mammry artery and right leg greater saphenous vein harvested endoscopically (LIMA to LAD, SVG to OM1, SVG to RAMUS INTERMEDIATE, SVG to PDA)   (N/A) TRANSESOPHAGEAL ECHOCARDIOGRAM (TEE) (N/A)  Total Length of Stay:  LOS: 5 days   Subjective: Patient without specific complaints, walked around the unit this morning  Objective: Vital signs in last 24 hours: Temp:  [98.6 F (37 C)-99.1 F (37.3 C)] 99.1 F (37.3 C) (01/02 0802) Pulse Rate:  [60-146] 74 (01/02 0600) Cardiac Rhythm: Normal sinus rhythm (01/02 0400) Resp:  [11-31] 19 (01/02 0600) BP: (100-135)/(66-89) 135/77 (01/02 0430) SpO2:  [70 %-100 %] 100 % (01/02 0600) Arterial Line BP: (118-121)/(62-67) 121/67 (01/01 1000) Weight:  [139 lb 1.8 oz (63.1 kg)] 139 lb 1.8 oz (63.1 kg) (01/02 0453)  Filed Weights   11/03/17 0420 11/04/17 0500 11/05/17 0453  Weight: 132 lb 11.2 oz (60.2 kg) 142 lb 3.2 oz (64.5 kg) 139 lb 1.8 oz (63.1 kg)    Weight change: -1.4 oz (-1.4 kg)   Hemodynamic parameters for last 24 hours: PAP: (13)/(7) 13/7  Intake/Output from previous day: 01/01 0701 - 01/02 0700 In: 2313.3 [P.O.:1680; I.V.:633.3] Out: 7169 [Urine:4240; Chest Tube:50]  Intake/Output this shift: No intake/output data recorded.  Current Meds: Scheduled Meds: . acetaminophen  1,000 mg Oral Q6H   Or  . acetaminophen (TYLENOL) oral liquid 160 mg/5 mL  1,000 mg Per Tube Q6H  . aspirin EC  325 mg Oral Daily   Or  . aspirin  324 mg Per Tube Daily  . atorvastatin  20 mg Oral Daily  . bisacodyl  10 mg Oral Daily   Or  . bisacodyl  10 mg Rectal Daily  . Chlorhexidine Gluconate Cloth  6 each Topical Daily  . docusate  sodium  200 mg Oral Daily  . enoxaparin (LOVENOX) injection  30 mg Subcutaneous QHS  . famotidine  10 mg Oral Daily  . feeding supplement (ENSURE ENLIVE)  237 mL Oral BID BM  . insulin aspart  0-24 Units Subcutaneous Q4H  . mouth rinse  15 mL Mouth Rinse BID  . metoprolol tartrate  12.5 mg Oral BID   Or  . metoprolol tartrate  12.5 mg Per Tube BID  . sodium chloride flush  10-40 mL Intracatheter Q12H  . sodium chloride flush  3 mL Intravenous Q12H   Continuous Infusions: . sodium chloride Stopped (11/04/17 0900)  . sodium chloride Stopped (11/04/17 1000)  . sodium chloride Stopped (11/04/17 1000)  . cefUROXime (ZINACEF)  IV 1.5 g (11/05/17 0759)  . DOPamine Stopped (11/03/17 1338)  . lactated ringers    . lactated ringers 20 mL/hr at 11/05/17 0400  . lactated ringers    . nitroGLYCERIN Stopped (11/04/17 1000)  . phenylephrine (NEO-SYNEPHRINE) Adult infusion Stopped (11/03/17 1339)   PRN Meds:.sodium chloride, hyoscyamine, lactated ringers, metoprolol tartrate, midazolam, morphine injection, ondansetron (ZOFRAN) IV, sodium chloride flush, traMADol  General appearance: alert and cooperative Neurologic: intact Heart: regular rate and rhythm, S1, S2 normal, no murmur, click, rub  or gallop Lungs: clear to auscultation bilaterally Abdomen: soft, non-tender; bowel sounds normal; no masses,  no organomegaly Extremities: extremities normal, atraumatic, no cyanosis or edema and Homans sign is negative, no sign of DVT Wound: Sternum stable  Lab Results: CBC: Recent Labs    11/04/17 1731 11/05/17 0420  WBC 10.6* 8.7  HGB 11.8* 11.5*  HCT 35.9* 35.2*  PLT 147* 137*   BMET:  Recent Labs    11/04/17 0400 11/04/17 1731 11/05/17 0420  NA 133*  --  136  K 4.3  --  4.1  CL 105  --  105  CO2 20*  --  26  GLUCOSE 104*  --  115*  BUN 7  --  6  CREATININE 0.65 0.73 0.65  CALCIUM 8.3*  --  8.7*    CMET: Lab Results  Component Value Date   WBC 8.7 11/05/2017   HGB 11.5 (L)  11/05/2017   HCT 35.2 (L) 11/05/2017   PLT 137 (L) 11/05/2017   GLUCOSE 115 (H) 11/05/2017   CHOL 169 01/24/2015   TRIG 82 01/24/2015   HDL 37 (L) 01/24/2015   LDLCALC 116 (H) 01/24/2015   ALT 22 10/31/2017   AST 24 10/31/2017   NA 136 11/05/2017   K 4.1 11/05/2017   CL 105 11/05/2017   CREATININE 0.65 11/05/2017   BUN 6 11/05/2017   CO2 26 11/05/2017   TSH 1.252 10/30/2017   PSA 1.41 12/14/2015   INR 1.31 11/03/2017   HGBA1C 5.0 11/02/2017      PT/INR:  Recent Labs    11/03/17 1320  LABPROT 16.2*  INR 1.31   Radiology: Dg Chest Port 1 View  Result Date: 11/05/2017 CLINICAL DATA:  52 year old male with a history of cardiac surgery EXAM: PORTABLE CHEST 1 VIEW COMPARISON:  11/04/2017 FINDINGS: Cardiomediastinal silhouette unchanged in size and contour. Surgical changes of median sternotomy and CABG. Interval removal of the Swan-Ganz catheter, with unchanged position of right IJ sheath. Unchanged position of large bore left-sided thoracostomy tube. Epicardial pacing leads project over the low mediastinum/upper midline abdomen. Low lung volumes with linear atelectasis. No pneumothorax. No pleural effusion. IMPRESSION: Surgical changes of prior median sternotomy and CABG with low lung volumes and likely atelectasis. Interval removal of the Swan-Ganz catheter and mediastinal/ pleural drains. Single left sided large-bore thoracostomy tube remains in place with no evidence of pneumothorax. Electronically Signed   By: Corrie Mckusick D.O.   On: 11/05/2017 07:34     Assessment/Plan: S/P Procedure(s) (LRB): CORONARY ARTERY BYPASS GRAFTING (CABG) x four, using left internal mammry artery and right leg greater saphenous vein harvested endoscopically (LIMA to LAD, SVG to OM1, SVG to RAMUS INTERMEDIATE, SVG to PDA)   (N/A) TRANSESOPHAGEAL ECHOCARDIOGRAM (TEE) (N/A) Mobilize Diuresis Diabetes control Plan for transfer to step-down: see transfer orders     Grace Isaac 11/05/2017  8:06 AM

## 2017-11-06 ENCOUNTER — Inpatient Hospital Stay (HOSPITAL_COMMUNITY): Payer: BLUE CROSS/BLUE SHIELD

## 2017-11-06 LAB — BASIC METABOLIC PANEL
Anion gap: 8 (ref 5–15)
BUN: 6 mg/dL (ref 6–20)
CO2: 25 mmol/L (ref 22–32)
Calcium: 8.6 mg/dL — ABNORMAL LOW (ref 8.9–10.3)
Chloride: 100 mmol/L — ABNORMAL LOW (ref 101–111)
Creatinine, Ser: 0.49 mg/dL — ABNORMAL LOW (ref 0.61–1.24)
GFR calc Af Amer: >60 mL/min (ref >60)
GFR calc non Af Amer: >60 mL/min (ref >60)
Glucose, Bld: 103 mg/dL — ABNORMAL HIGH (ref 65–99)
Potassium: 3.6 mmol/L (ref 3.5–5.1)
Sodium: 133 mmol/L — ABNORMAL LOW (ref 135–145)

## 2017-11-06 LAB — CBC
HCT: 34.7 % — ABNORMAL LOW (ref 39.0–52.0)
Hemoglobin: 11.1 g/dL — ABNORMAL LOW (ref 13.0–17.0)
MCH: 27.8 pg (ref 26.0–34.0)
MCHC: 32 g/dL (ref 30.0–36.0)
MCV: 86.8 fL (ref 78.0–100.0)
Platelets: 153 10*3/uL (ref 150–400)
RBC: 4 MIL/uL — ABNORMAL LOW (ref 4.22–5.81)
RDW: 13.2 % (ref 11.5–15.5)
WBC: 8.4 10*3/uL (ref 4.0–10.5)

## 2017-11-06 LAB — GLUCOSE, CAPILLARY
Glucose-Capillary: 81 mg/dL (ref 65–99)
Glucose-Capillary: 156 mg/dL — ABNORMAL HIGH (ref 65–99)
Glucose-Capillary: 104 mg/dL — ABNORMAL HIGH (ref 65–99)
Glucose-Capillary: 97 mg/dL (ref 65–99)

## 2017-11-06 MED ORDER — FUROSEMIDE 40 MG PO TABS
40.0000 mg | ORAL_TABLET | Freq: Every day | ORAL | Status: DC
  Administered 2017-11-06 – 2017-11-08 (×3): 40 mg via ORAL
  Filled 2017-11-06: qty 2
  Filled 2017-11-06 (×2): qty 1

## 2017-11-06 MED ORDER — POTASSIUM CHLORIDE CRYS ER 20 MEQ PO TBCR
20.0000 meq | EXTENDED_RELEASE_TABLET | Freq: Every day | ORAL | Status: DC
  Administered 2017-11-06 – 2017-11-08 (×3): 20 meq via ORAL
  Filled 2017-11-06 (×3): qty 1

## 2017-11-06 MED ORDER — SIMETHICONE 80 MG PO CHEW
80.0000 mg | CHEWABLE_TABLET | Freq: Four times a day (QID) | ORAL | Status: DC | PRN
  Administered 2017-11-06: 80 mg via ORAL
  Filled 2017-11-06: qty 1

## 2017-11-06 NOTE — Progress Notes (Signed)
Removed Epicardial wires per orders and policy. All ends clean and intact. Pt tolerated well V/S stable and charted. Educated pt about 1 hour bedrest finished at 1400. Pola Corn, RN

## 2017-11-06 NOTE — Care Management Note (Addendum)
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- CM to follow for transition needs post op  Expected Discharge Date:                  Expected Discharge Plan:  Home/Self Care  In-House Referral:     Discharge planning Services  CM Consult  Post Acute Care Choice:    Choice offered to:     DME Arranged:    DME Agency:     HH Arranged:    HH Agency:     Status of Service:  In process, will continue to follow  If discussed at Long Length of Stay Meetings, dates discussed:    Discharge Disposition:   Additional Comments:  11/06/17- 1130- Marvetta Gibbons RN, CM- in to speak with pt regarding transition plan- per pt he does not have anyone to stay with him this week- but will have his mother available next week- discussed other options for pt to have someone available to him but pt states that he does not have anyone else to stay with or that can stay with him- he does state that he can have someone call and check in on him and maybe drive by. Pt requesting a RW for home- will need order to provide DME prior to discharge. Cardiac rehab in to see pt - pt gave permission for CM to contact family to see about options for pt.   Dawayne Patricia, RN 11/06/2017, 11:35 AM

## 2017-11-06 NOTE — Progress Notes (Addendum)
Nutrition Follow Up  DOCUMENTATION CODES:   Not applicable  INTERVENTION:    Continue Ensure Enlive po BID, each supplement provides 350 kcal and 20 grams of protein   NUTRITION DIAGNOSIS:   Increased nutrient needs related to post-op healing as evidenced by estimated needs, ongoing  GOAL:   Patient will meet greater than or equal to 90% of their needs, progressing  MONITOR:   PO intake, Supplement acceptance, Labs, Weight trends, Skin  ASSESSMENT:   52 yo Male with PMH of abnormal weight loss and sharp L-sided chest pain. 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  Pt s/p CABG 12/31. PO intake variable at 10-50% per flowsheet records. Receiving Ensure Enlive nutrition supplements BID. Labs and medications reviewed. Na 133 (L). CBG's [id number].  Diet Order:  Diet heart healthy/carb modified Room service appropriate? Yes; Fluid consistency: Thin  EDUCATION NEEDS:   No education needs have been identified at this time  Skin:  Skin Assessment: Reviewed RN Assessment  Last BM:  1/3  Height:   Ht Readings from Last 1 Encounters:  11/03/17 5\' 10"  (1.778 m)    Weight:   Wt Readings from Last 1 Encounters:  11/06/17 138 lb (62.6 kg)    Ideal Body Weight:  75 kg  BMI:  Body mass index is 19.8 kg/m.  Estimated Nutritional Needs:   Kcal:  1800-2000  Protein:  80-95 gm  Fluid:  1.8-2.0 L  Arthur Holms, RD, LDN Pager #: [phone] After-Hours Pager #: [phone]

## 2017-11-06 NOTE — Addendum Note (Signed)
Addendum  created 11/06/17 2306 by Belinda Block, MD   Diagnosis association updated

## 2017-11-06 NOTE — Progress Notes (Addendum)
      [address]       [place], [address]             [phone]      3 Days Post-Op Procedure(s) (LRB): CORONARY ARTERY BYPASS GRAFTING (CABG) x four, using left internal mammry artery and right leg greater saphenous vein harvested endoscopically (LIMA to LAD, SVG to OM1, SVG to RAMUS INTERMEDIATE, SVG to PDA)   (N/A) TRANSESOPHAGEAL ECHOCARDIOGRAM (TEE) (N/A)   Subjective:  Mr. Daft states he isn't doing very well this morning.  He states that he has some stomach upset.  He moved his bowels last night.  Denies N/V  Objective: Vital signs in last 24 hours: Temp:  [98.6 F (37 C)-99.1 F (37.3 C)] 98.8 F (37.1 C) (01/03 0323) Pulse Rate:  [66-82] 82 (01/02 2214) Cardiac Rhythm: Normal sinus rhythm (01/02 2300) Resp:  [17-26] 17 (01/02 2214) BP: (106-128)/(69-79) 106/69 (01/03 0323) SpO2:  [100 %] 100 % (01/02 2214) Weight:  [138 lb (62.6 kg)] 138 lb (62.6 kg) (01/03 0323)  Intake/Output from previous day: 01/02 0701 - 01/03 0700 In: 860 [P.O.:840; I.V.:20] Out: 1010 [Urine:1000; Chest Tube:10]  General appearance: alert, cooperative and no distress Heart: regular rate and rhythm Lungs: clear to auscultation bilaterally Abdomen: soft, non-tender; bowel sounds normal; no masses,  no organomegaly Extremities: edema trace Wound: clean and dry  Lab Results: Recent Labs    11/05/17 0420 11/06/17 0314  WBC 8.7 8.4  HGB 11.5* 11.1*  HCT 35.2* 34.7*  PLT 137* 153   BMET:  Recent Labs    11/05/17 0420 11/06/17 0314  NA 136 133*  K 4.1 3.6  CL 105 100*  CO2 26 25  GLUCOSE 115* 103*  BUN 6 6  CREATININE 0.65 0.49*  CALCIUM 8.7* 8.6*    PT/INR:  Recent Labs    11/03/17 1320  LABPROT 16.2*  INR 1.31   ABG    Component Value Date/Time   PHART 7.382 11/03/2017 1909   HCO3 20.7 11/03/2017 1909   TCO2 23 11/03/2017 1911   ACIDBASEDEF 4.0 (H) 11/03/2017 1909   O2SAT 100.0 11/03/2017 1909   CBG (last 3)  Recent Labs     11/05/17 1700 11/05/17 2150 11/06/17 0613  GLUCAP 144* 80 81    Assessment/Plan: S/P Procedure(s) (LRB): CORONARY ARTERY BYPASS GRAFTING (CABG) x four, using left internal mammry artery and right leg greater saphenous vein harvested endoscopically (LIMA to LAD, SVG to OM1, SVG to RAMUS INTERMEDIATE, SVG to PDA)   (N/A) TRANSESOPHAGEAL ECHOCARDIOGRAM (TEE) (N/A)  1. CV- NSR, BP is on low side- continue Lopressor, wont tolerate ACE/ARB 2. Pulm- no acute issues, continue IS... CXR has been ordered, but wasn't completed will review 3. Renal- creatinine WNL, weight is trending up, will start oral lasix, potassium supplementation 4. GI- abd exam benign, has moved bowels, will order simethicone prn 5. Expected post operative blood loss anemia- Hgb stable at 11.1 6. Dispo- patient with likely gas pain will start simethicone, maintaining NSR, will d/c EPW, start low dose Lasix, potassium   LOS: 6 days    Ellwood Handler 11/06/2017 I have seen and examined Charlie Pitter and agree with the above assessment  and plan.  Grace Isaac MD Beeper [phone] Office [phone] 11/06/2017 10:55 AM

## 2017-11-06 NOTE — Progress Notes (Signed)
CARDIAC REHAB PHASE I   PRE:  Rate/Rhythm: 88 SR  BP:  Supine:   Sitting: 114/65  Standing:    SaO2: 93%RA  MODE:  Ambulation: 470 ft   POST:  Rate/Rhythm: 93 SR  BP:  Supine:   Sitting: 108/53  Standing:    SaO2: 100%RA 1125-1155 Pt walked 470 ft on RA with rolling walker and minimal asst. Stopped several times to rest. Encouraged him not to put pressure on arms. To sitting on side of bed after walk. Case manager to get pt rolling walker. Set up lunch.    Graylon Good, RN BSN  11/06/2017 11:54 AM

## 2017-11-07 LAB — GLUCOSE, CAPILLARY: Glucose-Capillary: 81 mg/dL (ref 65–99)

## 2017-11-07 MED FILL — Sodium Chloride IV Soln 0.9%: INTRAVENOUS | Qty: 2000 | Status: AC

## 2017-11-07 MED FILL — Heparin Sodium (Porcine) Inj 1000 Unit/ML: INTRAMUSCULAR | Qty: 20 | Status: AC

## 2017-11-07 MED FILL — Lidocaine HCl IV Inj 20 MG/ML: INTRAVENOUS | Qty: 5 | Status: AC

## 2017-11-07 MED FILL — Electrolyte-R (PH 7.4) Solution: INTRAVENOUS | Qty: 5000 | Status: AC

## 2017-11-07 MED FILL — Sodium Bicarbonate IV Soln 8.4%: INTRAVENOUS | Qty: 50 | Status: AC

## 2017-11-07 MED FILL — Mannitol IV Soln 20%: INTRAVENOUS | Qty: 500 | Status: AC

## 2017-11-07 NOTE — Progress Notes (Addendum)
      [address]       [place] [address]             [phone]      4 Days Post-Op Procedure(s) (LRB): CORONARY ARTERY BYPASS GRAFTING (CABG) x four, using left internal mammry artery and right leg greater saphenous vein harvested endoscopically (LIMA to LAD, SVG to OM1, SVG to RAMUS INTERMEDIATE, SVG to PDA)   (N/A) TRANSESOPHAGEAL ECHOCARDIOGRAM (TEE) (N/A)   Subjective:  No new complaints.  States abdominal pain is improved.  He has flat affect and is difficult to communicate with.  Objective: Vital signs in last 24 hours: Temp:  [98.9 F (37.2 C)-100.3 F (37.9 C)] 99.6 F (37.6 C) (01/04 0401) Pulse Rate:  [70-82] 78 (01/04 0401) Cardiac Rhythm: Normal sinus rhythm (01/03 2117) Resp:  [16-36] 36 (01/04 0401) BP: (103-122)/(65-79) 107/76 (01/04 0401) SpO2:  [95 %-100 %] 100 % (01/03 2150) Weight:  [135 lb 4.8 oz (61.4 kg)] 135 lb 4.8 oz (61.4 kg) (01/04 0401)  Intake/Output from previous day: 01/03 0701 - 01/04 0700 In: 1140 [P.O.:1140] Out: 150 [Urine:150]  General appearance: alert, cooperative and no distress Heart: regular rate and rhythm Lungs: clear to auscultation bilaterally Abdomen: soft, non-tender; bowel sounds normal; no masses,  no organomegaly Extremities: edema trace Wound: clean and dry  Lab Results: Recent Labs    11/05/17 0420 11/06/17 0314  WBC 8.7 8.4  HGB 11.5* 11.1*  HCT 35.2* 34.7*  PLT 137* 153   BMET:  Recent Labs    11/05/17 0420 11/06/17 0314  NA 136 133*  K 4.1 3.6  CL 105 100*  CO2 26 25  GLUCOSE 115* 103*  BUN 6 6  CREATININE 0.65 0.49*  CALCIUM 8.7* 8.6*    PT/INR: No results for input(s): LABPROT, INR in the last 72 hours. ABG    Component Value Date/Time   PHART 7.382 11/03/2017 1909   HCO3 20.7 11/03/2017 1909   TCO2 23 11/03/2017 1911   ACIDBASEDEF 4.0 (H) 11/03/2017 1909   O2SAT 100.0 11/03/2017 1909   CBG (last 3)  Recent Labs    11/06/17 1623 11/06/17 2047 11/07/17 0607    GLUCAP 104* 97 81    Assessment/Plan: S/P Procedure(s) (LRB): CORONARY ARTERY BYPASS GRAFTING (CABG) x four, using left internal mammry artery and right leg greater saphenous vein harvested endoscopically (LIMA to LAD, SVG to OM1, SVG to RAMUS INTERMEDIATE, SVG to PDA)   (N/A) TRANSESOPHAGEAL ECHOCARDIOGRAM (TEE) (N/A)  1. CV- NSR, BP controlled- continue Lopressor 2. Pulm- no acute issues, continue IS 3. Renal- creatinine stable, weight is trending down, continue Lasix for a few days 4. GI- chronic history of abdominal pain per chart, simethicone helped, patient is moving bowels 5. CBGs controlled- patient not a diabetic will d/c SSIP 6. Dispo- patient stable, his mother will be staying with him starting next wee, home health arrangements have been made will d/c home today vs. tomorrow   LOS: 7 days    Ellwood Handler 11/07/2017  still unclear patient about home care from the patient , plan d/c tomorrow if plans are firm I have seen and examined Jeffrey Prince and agree with the above assessment  and plan.  Grace Isaac MD Beeper [phone] Office [phone] 11/07/2017 3:17 PM

## 2017-11-07 NOTE — Plan of Care (Signed)
  Education: Knowledge of General Education information will improve 11/07/2017 2334 - Progressing by Drenda Freeze, RN   Education: Knowledge of General Education information will improve 11/07/2017 2334 - Progressing by Drenda Freeze, RN   Clinical Measurements: Ability to maintain clinical measurements within normal limits will improve 11/07/2017 2334 - Progressing by Drenda Freeze, RN

## 2017-11-07 NOTE — Clinical Social Work Note (Signed)
Clinical Social Work Assessment  Patient Details  Name: Jeffrey Prince MRN: [id number] Date of Birth: July 06, 1966  Date of referral:  11/06/17               Reason for consult:  Discharge Planning, Other (Comment Required)(need support system)                Permission sought to share information with:  Family Supports Permission granted to share information::  Yes, Verbal Permission Granted  Name::     Royden Purl  Agency::     Relationship::  uncle  Contact Information:  [phone]  Housing/Transportation Living arrangements for the past 2 months:  [place] of Information:  Patient Patient Interpreter Needed:  None Criminal Activity/Legal Involvement Pertinent to Current Situation/Hospitalization:  No - Comment as needed Significant Relationships:  Parents Lives with:  Self Do you feel safe going back to the place where you live?    Need for family participation in patient care:  Yes (Comment)  Care giving concerns: no family at bedside  Social Worker assessment / plan: CSW met patient at bedside due to [place] consult stating "patient had no where to go upon discharge". Patient stated he lives at home by himself and he has parents in the area. Patient stated he has a home to discharge too but no one will be available for help for the first couple of days. CSW explained the process of SNF and stated patient ill most likely have to pay out of pocket for stay. Patient stated he does not have money to do this. CSW spoke with patient about possible having family check in on him. Patient stated he will work something out. Employment status:  Other (Comment)(pt ook time off of work) Forensic scientist:  Other (Comment Required)(BC/BS) PT Recommendations:  Not assessed at this time Information / Referral to community resources:  [place]  Patient/Family's Response to care:  Patient stated he will work something out with family  Patient/Family's  Understanding of and Emotional Response to Diagnosis, Current Treatment, and Prognosis: patient stated he will dc home  Emotional Assessment Appearance:  Appears stated age Attitude/Demeanor/Rapport:    Affect (typically observed):  Accepting Orientation:  Oriented to Self, Oriented to Situation, Oriented to Place, Oriented to  Time Alcohol / Substance use:  Not Applicable Psych involvement (Current and /or in the community):  No (Comment)  Discharge Needs  Concerns to be addressed:  No discharge needs identified Readmission within the last 30 days:  No Current discharge risk:  Lives alone, Lack of support system Barriers to Discharge:  No Barriers Identified   Wende Neighbors, LCSW 11/07/2017, 1:53 PM

## 2017-11-07 NOTE — Progress Notes (Signed)
CARDIAC REHAB PHASE I   PRE:  Rate/Rhythm: 87 SR    BP: sitting 113/76    SaO2: 99 RA  MODE:  Ambulation: 470 ft   POST:  Rate/Rhythm: 85 SR    BP: sitting 132/63     SaO2: 100 RA  Pt steady, independent with RW. Did have fatiguing legs with distance and rest x2. To recliner. Discussed sternal precautions, IS, mobility, diet, smoking (quit in Aug), and CRPII. Voiced understanding and requests his referral be sent to [facility]. Pt seems to have good reception.  1030-1314   Jeffrey Prince, ACSM 11/07/2017 11:05 AM

## 2017-11-07 NOTE — Care Management Note (Addendum)
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 Agency:  [place]  Status of Service:  Completed, signed off  If discussed at [place] of Stay Meetings, dates discussed:    Discharge Disposition: home/home health   Additional Comments:  11/07/17- 1030- Jaydee Ingman RN, CM- pt for possible d/c later today- orders placed for DME and HH needs- spoke with pt at bedside -choice offered for  [facility] agencies- per pt he would like to use [facility] for services- states he can call for a ride once he knows if he is going home- referral for [facility] and DME needs called to Butch Penny with [facility] - RW to be delivered to room prior to discharge.  1030-Received notice from Butch Penny with [facility] that they do not have staffing available and can not accept referral- spoke with pt and have permission to call other agencies to find appropriate staffing for [facility] needs- call made to [facility] who can not take referral- call then made to [facility] with Alvis Lemmings who accepted referral for [facility] needs- they can start with HHPT and add HHRN first of next week.   11/06/17- 1130- Marvetta Gibbons RN, CM- in to speak with pt regarding transition plan- per pt he does not have anyone to stay with him this week- but will have  his mother available next week- discussed other options for pt to have someone available to him but pt states that he does not have anyone else to stay with or that can stay with him- he does state that he can have someone call and check in on him and maybe drive by. Pt requesting a RW for home- will need order to provide DME prior to discharge. Cardiac rehab in to see pt - pt gave permission for CM to contact family to see about options for pt.     Dawayne Patricia, RN 11/07/2017, 10:34 AM

## 2017-11-08 MED ORDER — SIMETHICONE 80 MG PO CHEW: 80.0000 mg | CHEWABLE_TABLET | Freq: Four times a day (QID) | ORAL | 0 refills | Status: AC | PRN

## 2017-11-08 MED ORDER — POTASSIUM CHLORIDE CRYS ER 20 MEQ PO TBCR: 20.0000 meq | EXTENDED_RELEASE_TABLET | Freq: Every day | ORAL | 0 refills | Status: DC

## 2017-11-08 MED ORDER — TRAMADOL HCL 50 MG PO TABS: 50.0000 mg | ORAL_TABLET | Freq: Four times a day (QID) | ORAL | 0 refills | Status: DC | PRN

## 2017-11-08 MED ORDER — ASPIRIN 325 MG PO TBEC: 325.0000 mg | DELAYED_RELEASE_TABLET | Freq: Every day | ORAL | 0 refills | Status: AC

## 2017-11-08 MED ORDER — ATORVASTATIN CALCIUM 40 MG PO TABS: 40.0000 mg | ORAL_TABLET | Freq: Every day | ORAL | 0 refills | Status: AC

## 2017-11-08 MED ORDER — FUROSEMIDE 40 MG PO TABS: 40.0000 mg | ORAL_TABLET | Freq: Every day | ORAL | 0 refills | Status: DC

## 2017-11-08 MED ORDER — ACETAMINOPHEN 325 MG PO TABS: 650.0000 mg | ORAL_TABLET | Freq: Four times a day (QID) | ORAL | Status: DC | PRN

## 2017-11-08 MED ORDER — METOPROLOL TARTRATE 25 MG PO TABS: 12.5000 mg | ORAL_TABLET | Freq: Two times a day (BID) | ORAL | 1 refills | Status: DC

## 2017-11-08 NOTE — Progress Notes (Signed)
CT sutures removed per order.  Steri strips placed.  Sites unremarkable.

## 2017-11-08 NOTE — Progress Notes (Addendum)
[address]       [place] [address]             [phone]      5 Days Post-Op Procedure(s) (LRB): CORONARY ARTERY BYPASS GRAFTING (CABG) x four, using left internal mammry artery and right leg greater saphenous vein harvested endoscopically (LIMA to LAD, SVG to OM1, SVG to RAMUS INTERMEDIATE, SVG to PDA)   (N/A) TRANSESOPHAGEAL ECHOCARDIOGRAM (TEE) (N/A) Subjective: Still does not have anyone to look after him when he goes home. Feels okay this morning. Does not have an appetite. Stomach still bothers him sometimes.   Objective: Vital signs in last 24 hours: Temp:  [98.4 F (36.9 C)-99.1 F (37.3 C)] 99.1 F (37.3 C) (01/05 0411) Pulse Rate:  [69-80] 69 (01/04 2036) Cardiac Rhythm: Normal sinus rhythm (01/04 2000) Resp:  [16-20] 16 (01/05 0411) BP: (108-113)/(76-85) 108/77 (01/05 0411) SpO2:  [94 %-100 %] 96 % (01/05 0411) Weight:  [129 lb 14.4 oz (58.9 kg)] 129 lb 14.4 oz (58.9 kg) (01/05 0411)     Intake/Output from previous day: 01/04 0701 - 01/05 0700 In: 600 [P.O.:600] Out: -  Intake/Output this shift: No intake/output data recorded.  General appearance: alert, cooperative and no distress Heart: regular rate and rhythm, S1, S2 normal, no murmur, click, rub or gallop Lungs: clear to auscultation bilaterally Abdomen: soft, non-tender; bowel sounds normal; no masses,  no organomegaly Extremities: extremities normal, atraumatic, no cyanosis or edema Wound: clean and dry  Lab Results: Recent Labs    11/06/17 0314  WBC 8.4  HGB 11.1*  HCT 34.7*  PLT 153   BMET:  Recent Labs    11/06/17 0314  NA 133*  K 3.6  CL 100*  CO2 25  GLUCOSE 103*  BUN 6  CREATININE 0.49*  CALCIUM 8.6*    PT/INR: No results for input(s): LABPROT, INR in the last 72 hours. ABG    Component Value Date/Time   PHART 7.382 11/03/2017 1909   HCO3 20.7 11/03/2017 1909   TCO2 23 11/03/2017 1911   ACIDBASEDEF 4.0 (H) 11/03/2017 1909   O2SAT 100.0  11/03/2017 1909   CBG (last 3)  Recent Labs    11/06/17 1623 11/06/17 2047 11/07/17 0607  GLUCAP 104* 97 81    Assessment/Plan: S/P Procedure(s) (LRB): CORONARY ARTERY BYPASS GRAFTING (CABG) x four, using left internal mammry artery and right leg greater saphenous vein harvested endoscopically (LIMA to LAD, SVG to OM1, SVG to RAMUS INTERMEDIATE, SVG to PDA)   (N/A) TRANSESOPHAGEAL ECHOCARDIOGRAM (TEE) (N/A)  1. CV- NSR in the 90s, BP controlled- continue Lopressor 2. Pulm- no acute issues, continue IS, tolerating room air with good oxygen saturation.  3. Renal- creatinine stable, weight is trending down, continue Lasix for a few days 4. GI- chronic history of abdominal pain per chart, simethicone helped, patient is moving bowels 5. CBGs controlled- patient not a diabetic. A1C is 5.0 6. H and H stable, platelets are trending up 7. Lack of appetite- will order ensure  Plan: possibly home today if patient finds someone to stay with him. Mother is able to stay with him starting Monday. Does not qualify for a facility.       LOS: 8 days    Elgie Collard 11/08/2017  Patient says his aunt will come to get him and he will stay at her home after d/c I have seen and examined Charlie Pitter and agree with the above assessment  and plan.  Grace Isaac MD Beeper [phone] Office [phone] 11/08/2017 9:48 AM

## 2017-11-08 NOTE — Progress Notes (Signed)
Pt assisted to ambulate approx 964ft using RW, with one standing rest break.   No complaints, no tele alarms.  Ensure after walk per request.  Pt did not eat anything from breakfast tray.  Stated he has no one to stay with him at home today, concerned about discharge.  Informed PA Harriet Pho and Pt reassured that his safety is the priority.  Will con't plan of care.

## 2017-11-10 NOTE — Op Note (Signed)
NAME:  Jeffrey Prince, Jeffrey Prince                 ACCOUNT NO.:  MEDICAL RECORD NO.:  [id number]  LOCATION:                                 FACILITY:  PHYSICIAN:  Lanelle Bal, MD    DATE OF BIRTH:  09/27/1966  DATE OF PROCEDURE:  11/03/2017 DATE OF DISCHARGE:                              OPERATIVE REPORT   PREOPERATIVE DIAGNOSIS:  Coronary occlusive disease with recent non-ST- elevation myocardial infarction.  POSTOPERATIVE DIAGNOSIS:  Coronary occlusive disease with recent non-ST- elevation myocardial infarction.  SURGICAL PROCEDURE:  Coronary artery bypass grafting x4 with left internal mammary to the left anterior descending coronary artery, reversed saphenous vein graft to the ramus, reversed saphenous vein graft to the obtuse marginal, reversed saphenous vein graft to the posterior descending with endoscopic right leg vein harvesting of the greater saphenous vein, thigh and calf.  SURGEON:  Lanelle Bal, MD.  ASSISTANT:  Lars Pinks, PA.  BRIEF HISTORY:  The patient is a 52 year old male who over the past several months had had increasing history of substernal and left-sided chest pain with some shortness of breath with exertion.  A cardiac catheterization was performed by Dr. Humphrey Rolls in [facility] after the patient was admitted with a prolonged episode of chest pain.  This demonstrated proximal LAD disease with 90% stenosis, mid right 95% stenosis, first obtuse marginal 70% stenosis, and a distal left main with 70% stenosis. Because of the patient's symptoms and significant 3-vessel coronary artery disease, coronary artery bypass grafting was recommended.  The patient did have preserved LV function.  Risks and options were discussed with the patient and he was agreeable and signed informed consent.  DESCRIPTION OF PROCEDURE:  With Swan-Ganz and arterial line monitors in place, the patient underwent general endotracheal anesthesia without incident.  Skin of the chest  and legs was prepped with Betadine and draped in usual sterile manner.  Appropriate time-out was performed.  We then proceeded with endoscopic vein harvesting of the right thigh and calf, the greater saphenous vein.  The vein was of excellent quality and caliber.  Median sternotomy was performed.  Left internal mammary artery was dissected down as a pedicle graft.  The distal artery was divided that had good free flow.  Pericardium was opened.  Overall ventricular function appeared preserved.  The patient was systemically heparinized. Ascending aorta was cannulated.  The right atrium was cannulated.  An aortic root vent cardioplegia needle was introduced into the ascending aorta.  The patient was placed on cardiopulmonary bypass 2.4 L/min/m2. Sites of anastomosis were selected and dissected out of the epicardium. Aortic cross-clamp was applied and 500 mL of cold blood potassium cardioplegia was administered with diastolic arrest of the heart. Attention was turned first to the posterior descending coronary artery. The distal right coronary artery was somewhat thickened and calcified. The posterior descending vessel was relatively small, but was opened and admitted a 1.5-mm probe distally.  Using a running 7-0 Prolene, distal anastomosis was performed.  The heart was then elevated and the first obtuse marginal vessel with partially intramyocardial, the vessel was opened and admitted a 1-mm probe distally.  The vessel was 1.3 to 1.4 mm in size.  Using a  running 7-0 Prolene, distal anastomosis was performed. A high ramus branch was then also partially intramyocardial.  This vessel was opened and admitted a 1-mm probe distally.  Using a running 7- 0 Prolene, distal anastomosis was performed.  We then turned our attention to the mid to distal LAD, which was opened.  A 1.5-mm probe passed proximally and distally.  Using a running 8-0 Prolene, left internal mammary artery was anastomosed to the  left anterior descending coronary artery.  With the crossclamp still in place, 3 punch aortotomies were performed and each of the 3 vein grafts were anastomosed to the ascending aorta.  The heart was allowed to passively fill and de-air.  The bulldog on the mammary artery was removed with prompt rise in myocardial septal temperature.  The proximal anastomoses were completed and aortic cross-clamp was removed with a total cross- clamp time of 89 minutes.  The patient spontaneously converted to a sinus rhythm.  Sites of anastomosis were inspected and free of bleeding. He was then ventilated and with body temperature rewarmed to 37 degrees, he was weaned from cardiopulmonary bypass without difficulty.  He remained hemodynamically stable.  He was decannulated in usual fashion. Protamine sulfate was administered with operative field hemostatic. Atrial and ventricular pacing wires had been applied.  Graft marker was applied.  Left pleural tube and Blake mediastinal drain were left in place.  Pericardium was loosely reapproximated.  Sternum was closed with #6 stainless steel wire.  Fascia was closed with interrupted 0 Vicryl, running 3-0 Vicryl in subcutaneous tissue, 4-0 subcuticular stitch in skin edges.  Dry dressings were applied.  Sponge and needle count was reported as correct at the completion of procedure.  The patient tolerated the procedure without obvious complication and was transferred to the Surgical Intensive Care Unit for further postoperative care. Total pump time was 125 minutes.  The patient did not require any blood bank blood products during the procedure.     Lanelle Bal, MD     EG/MEDQ  D:  11/07/2017  T:  11/08/2017  Job:  [id number]  cc:   Neoma Laming, MD

## 2017-11-17 ENCOUNTER — Ambulatory Visit: Payer: BLUE CROSS/BLUE SHIELD | Admitting: Surgery

## 2017-11-19 ENCOUNTER — Other Ambulatory Visit: Payer: Self-pay

## 2017-11-19 ENCOUNTER — Emergency Department: Payer: BLUE CROSS/BLUE SHIELD

## 2017-11-19 ENCOUNTER — Inpatient Hospital Stay
Admission: EM | Admit: 2017-11-19 | Discharge: 2017-11-20 | DRG: 287 | Disposition: A | Discharge status: Home / Self Care | Payer: BLUE CROSS/BLUE SHIELD | Attending: Specialist | Admitting: Specialist

## 2017-11-19 ENCOUNTER — Encounter: Payer: Self-pay | Admitting: Emergency Medicine

## 2017-11-19 DIAGNOSIS — K219 Gastro-esophageal reflux disease without esophagitis: Secondary | ICD-10-CM | POA: Diagnosis present

## 2017-11-19 DIAGNOSIS — Z951 Presence of aortocoronary bypass graft: Secondary | ICD-10-CM | POA: Diagnosis not present

## 2017-11-19 DIAGNOSIS — Z79899 Other long term (current) drug therapy: Secondary | ICD-10-CM | POA: Diagnosis not present

## 2017-11-19 DIAGNOSIS — H5462 Unqualified visual loss, left eye, normal vision right eye: Secondary | ICD-10-CM | POA: Diagnosis present

## 2017-11-19 DIAGNOSIS — Z87891 Personal history of nicotine dependence: Secondary | ICD-10-CM | POA: Diagnosis not present

## 2017-11-19 DIAGNOSIS — I10 Essential (primary) hypertension: Secondary | ICD-10-CM | POA: Diagnosis present

## 2017-11-19 DIAGNOSIS — Z882 Allergy status to sulfonamides status: Secondary | ICD-10-CM

## 2017-11-19 DIAGNOSIS — R0789 Other chest pain: Secondary | ICD-10-CM | POA: Diagnosis present

## 2017-11-19 DIAGNOSIS — I25110 Atherosclerotic heart disease of native coronary artery with unstable angina pectoris: Principal | ICD-10-CM | POA: Diagnosis present

## 2017-11-19 DIAGNOSIS — Z7982 Long term (current) use of aspirin: Secondary | ICD-10-CM

## 2017-11-19 DIAGNOSIS — Z91030 Bee allergy status: Secondary | ICD-10-CM

## 2017-11-19 DIAGNOSIS — I214 Non-ST elevation (NSTEMI) myocardial infarction: Secondary | ICD-10-CM | POA: Diagnosis present

## 2017-11-19 DIAGNOSIS — E785 Hyperlipidemia, unspecified: Secondary | ICD-10-CM | POA: Diagnosis present

## 2017-11-19 DIAGNOSIS — R079 Chest pain, unspecified: Secondary | ICD-10-CM

## 2017-11-19 DIAGNOSIS — Z885 Allergy status to narcotic agent status: Secondary | ICD-10-CM | POA: Diagnosis not present

## 2017-11-19 LAB — URINE DRUG SCREEN, QUALITATIVE (ARMC ONLY)
AMPHETAMINES, UR SCREEN: NOT DETECTED
Barbiturates, Ur Screen: NOT DETECTED
Benzodiazepine, Ur Scrn: NOT DETECTED
COCAINE METABOLITE, UR ~~LOC~~: NOT DETECTED
Cannabinoid 50 Ng, Ur ~~LOC~~: NOT DETECTED
MDMA (ECSTASY) UR SCREEN: NOT DETECTED
METHADONE SCREEN, URINE: NOT DETECTED
Opiate, Ur Screen: NOT DETECTED
Phencyclidine (PCP) Ur S: NOT DETECTED
TRICYCLIC, UR SCREEN: NOT DETECTED

## 2017-11-19 LAB — CBC
HCT: 35.5 % — ABNORMAL LOW (ref 40.0–52.0)
Hemoglobin: 12 g/dL — ABNORMAL LOW (ref 13.0–18.0)
MCH: 28.8 pg (ref 26.0–34.0)
MCHC: 33.9 g/dL (ref 32.0–36.0)
MCV: 84.8 fL (ref 80.0–100.0)
PLATELETS: 514 10*3/uL — AB (ref 150–440)
RBC: 4.19 MIL/uL — AB (ref 4.40–5.90)
RDW: 13.9 % (ref 11.5–14.5)
WBC: 5 10*3/uL (ref 3.8–10.6)

## 2017-11-19 LAB — APTT: APTT: 33 s (ref 24–36)

## 2017-11-19 LAB — CBC WITH DIFFERENTIAL/PLATELET
BASOS PCT: 1 %
Basophils Absolute: 0.1 10*3/uL (ref 0–0.1)
EOS ABS: 0.2 10*3/uL (ref 0–0.7)
Eosinophils Relative: 4 %
HEMATOCRIT: 35.1 % — AB (ref 40.0–52.0)
HEMOGLOBIN: 11.6 g/dL — AB (ref 13.0–18.0)
LYMPHS ABS: 0.9 10*3/uL — AB (ref 1.0–3.6)
Lymphocytes Relative: 16 %
MCH: 28.5 pg (ref 26.0–34.0)
MCHC: 33 g/dL (ref 32.0–36.0)
MCV: 86.2 fL (ref 80.0–100.0)
MONOS PCT: 9 %
Monocytes Absolute: 0.5 10*3/uL (ref 0.2–1.0)
NEUTROS ABS: 3.9 10*3/uL (ref 1.4–6.5)
NEUTROS PCT: 70 %
Platelets: 516 10*3/uL — ABNORMAL HIGH (ref 150–440)
RBC: 4.07 MIL/uL — AB (ref 4.40–5.90)
RDW: 14 % (ref 11.5–14.5)
WBC: 5.6 10*3/uL (ref 3.8–10.6)

## 2017-11-19 LAB — CREATININE, SERUM
CREATININE: 0.6 mg/dL — AB (ref 0.61–1.24)
GFR calc Af Amer: >60 mL/min (ref >60)

## 2017-11-19 LAB — COMPREHENSIVE METABOLIC PANEL
ALBUMIN: 3.6 g/dL (ref 3.5–5.0)
ALK PHOS: 91 U/L (ref 38–126)
ALT: 52 U/L (ref 17–63)
AST: 23 U/L (ref 15–41)
Anion gap: 8 (ref 5–15)
BILIRUBIN TOTAL: 0.7 mg/dL (ref 0.3–1.2)
BUN: 9 mg/dL (ref 6–20)
CALCIUM: 9 mg/dL (ref 8.9–10.3)
CO2: 26 mmol/L (ref 22–32)
CREATININE: 0.69 mg/dL (ref 0.61–1.24)
Chloride: 105 mmol/L (ref 101–111)
GFR calc Af Amer: >60 mL/min (ref >60)
GFR calc non Af Amer: >60 mL/min (ref >60)
GLUCOSE: 99 mg/dL (ref 65–99)
Potassium: 3.6 mmol/L (ref 3.5–5.1)
Sodium: 139 mmol/L (ref 135–145)
TOTAL PROTEIN: 7.7 g/dL (ref 6.5–8.1)

## 2017-11-19 LAB — TROPONIN I
Troponin I: 0.04 ng/mL (ref <0.03)
Troponin I: 0.04 ng/mL (ref <0.03)

## 2017-11-19 LAB — LIPID PANEL
CHOL/HDL RATIO: 2.9 ratio
Cholesterol: 106 mg/dL (ref 0–200)
HDL: 36 mg/dL — AB (ref >40)
LDL Cholesterol: 59 mg/dL (ref 0–99)
Triglycerides: 53 mg/dL (ref <150)
VLDL: 11 mg/dL (ref 0–40)

## 2017-11-19 LAB — PROTIME-INR
INR: 1.09
PROTHROMBIN TIME: 14 s (ref 11.4–15.2)

## 2017-11-19 MED ORDER — ASPIRIN EC 325 MG PO TBEC
325.0000 mg | DELAYED_RELEASE_TABLET | Freq: Every day | ORAL | Status: DC
  Administered 2017-11-19 – 2017-11-20 (×2): 325 mg via ORAL
  Filled 2017-11-19 (×2): qty 1

## 2017-11-19 MED ORDER — ASPIRIN 81 MG PO CHEW
324.0000 mg | CHEWABLE_TABLET | Freq: Once | ORAL | Status: AC
  Administered 2017-11-19: 324 mg via ORAL
  Filled 2017-11-19: qty 4

## 2017-11-19 MED ORDER — TRAMADOL HCL 50 MG PO TABS
50.0000 mg | ORAL_TABLET | Freq: Four times a day (QID) | ORAL | Status: DC | PRN
  Administered 2017-11-19 – 2017-11-20 (×2): 50 mg via ORAL
  Filled 2017-11-19 (×2): qty 1

## 2017-11-19 MED ORDER — NITROGLYCERIN 2 % TD OINT
1.0000 [in_us] | TOPICAL_OINTMENT | Freq: Once | TRANSDERMAL | Status: AC
  Administered 2017-11-19: 1 [in_us] via TOPICAL
  Filled 2017-11-19: qty 1

## 2017-11-19 MED ORDER — FAMOTIDINE 20 MG PO TABS
ORAL_TABLET | ORAL | Status: AC
  Filled 2017-11-19: qty 1

## 2017-11-19 MED ORDER — METOPROLOL TARTRATE 25 MG PO TABS
12.5000 mg | ORAL_TABLET | Freq: Two times a day (BID) | ORAL | Status: DC
Start: 2017-11-19 — End: 2017-11-20
  Administered 2017-11-19 – 2017-11-20 (×2): 12.5 mg via ORAL
  Filled 2017-11-19 (×2): qty 1

## 2017-11-19 MED ORDER — ENOXAPARIN SODIUM 40 MG/0.4ML ~~LOC~~ SOLN
40.0000 mg | SUBCUTANEOUS | Status: DC
  Administered 2017-11-19: 40 mg via SUBCUTANEOUS
  Filled 2017-11-19: qty 0.4

## 2017-11-19 MED ORDER — ATORVASTATIN CALCIUM 20 MG PO TABS
40.0000 mg | ORAL_TABLET | Freq: Every day | ORAL | Status: DC
  Administered 2017-11-19 – 2017-11-20 (×2): 40 mg via ORAL
  Filled 2017-11-19 (×2): qty 2

## 2017-11-19 MED ORDER — SENNOSIDES-DOCUSATE SODIUM 8.6-50 MG PO TABS: 1.0000 | ORAL_TABLET | Freq: Every evening | ORAL | Status: DC | PRN

## 2017-11-19 MED ORDER — ACETAMINOPHEN 325 MG PO TABS: 650.0000 mg | ORAL_TABLET | Freq: Four times a day (QID) | ORAL | Status: DC | PRN

## 2017-11-19 MED ORDER — ONDANSETRON HCL 4 MG/2ML IJ SOLN: 4.0000 mg | Freq: Four times a day (QID) | INTRAMUSCULAR | Status: DC | PRN

## 2017-11-19 MED ORDER — ONDANSETRON HCL 4 MG PO TABS: 4.0000 mg | ORAL_TABLET | Freq: Four times a day (QID) | ORAL | Status: DC | PRN

## 2017-11-19 MED ORDER — ACETAMINOPHEN 650 MG RE SUPP: 650.0000 mg | Freq: Four times a day (QID) | RECTAL | Status: DC | PRN

## 2017-11-19 MED ORDER — FAMOTIDINE 20 MG PO TABS
20.0000 mg | ORAL_TABLET | Freq: Two times a day (BID) | ORAL | Status: DC
  Administered 2017-11-19 – 2017-11-20 (×3): 20 mg via ORAL
  Filled 2017-11-19 (×2): qty 1

## 2017-11-19 MED ORDER — NITROGLYCERIN 2 % TD OINT
1.0000 [in_us] | TOPICAL_OINTMENT | Freq: Four times a day (QID) | TRANSDERMAL | Status: DC | PRN
  Administered 2017-11-20 (×2): 1 [in_us] via TOPICAL
  Filled 2017-11-19 (×2): qty 1

## 2017-11-19 MED ORDER — SODIUM CHLORIDE 0.9% FLUSH
3.0000 mL | Freq: Two times a day (BID) | INTRAVENOUS | Status: DC
  Administered 2017-11-19 – 2017-11-20 (×2): 3 mL via INTRAVENOUS

## 2017-11-19 MED ORDER — SODIUM CHLORIDE 0.9 % IV SOLN: 250.0000 mL | INTRAVENOUS | Status: DC | PRN

## 2017-11-19 NOTE — H&P (Signed)
[facility] [place] NAME: Jeffrey Prince    MR#:  [id number]  DATE OF BIRTH:  1966-09-08  DATE OF ADMISSION:  11/19/2017  PRIMARY CARE PHYSICIAN: Perrin Maltese, MD   REQUESTING/REFERRING PHYSICIAN: dr Burlene Arnt  CHIEF COMPLAINT:   Chest pain HISTORY OF PRESENT ILLNESS:  Jeffrey Prince  is a 52 y.o. male with a known history of recent 4V CABG who presents to the emergency room due to chest pain. Patient reports that yesterday he had substernal chest pain without any radiation or other symptoms associated. It went on and off for several minutes to the day. No exacerbating or relieving factors. He came in today due to the intensity of the pain. He had an episode this morning that lasted 5-10 minutes of substernal changes pain without radiation. It went away on its own. However approximate 30-45 minutes later he again developed substernal chest pain without any associated symptoms and this time it was a 10 out of 10. He presents to the ER for further evaluation. In the emergency room first set of troponins is 0.04. EKG shows T-wave inversions which are new. ED physician has spoken with Dr Humphrey Rolls who has recommended hospitalization and further evaluation.  PAST MEDICAL HISTORY:   Past Medical History:  Diagnosis Date  . Acne   . Blind left eye    Since birth  . Chorea   . Diverticulosis   . ED (erectile dysfunction)   . GERD (gastroesophageal reflux disease)   . Hemorrhoids   . Weight loss    Abnormal    PAST SURGICAL HISTORY:   Past Surgical History:  Procedure Laterality Date  . BOTOX INJECTION N/A 10/09/2016   Procedure: BOTOX INJECTION;  Surgeon: Jules Husbands, MD;  Location: [facility];  Service: General;  Laterality: N/A;  . BOTOX INJECTION N/A 07/23/2017   Procedure: BOTOX INJECTION;  Surgeon: Jules Husbands, MD;  Location: [facility];  Service: General;  Laterality: N/A;  . COLONOSCOPY     with polyp removal  . CORONARY ARTERY BYPASS  GRAFT N/A 11/03/2017   Procedure: CORONARY ARTERY BYPASS GRAFTING (CABG) x four, using left internal mammry artery and right leg greater saphenous vein harvested endoscopically (LIMA to LAD, SVG to OM1, SVG to RAMUS INTERMEDIATE, SVG to PDA)  ;  Surgeon: Grace Isaac, MD;  Location: [place];  Service: Open Heart Surgery;  Laterality: N/A;  . EVALUATION UNDER ANESTHESIA WITH ANAL FISSUROTOMY N/A 10/09/2016   Procedure: EXAM UNDER ANESTHESIA WITH ANAL FISSUROTOMY chemical sphincterotomy w BOtox.;  Surgeon: Jules Husbands, MD;  Location: [facility];  Service: General;  Laterality: N/A;  Lithotomy, make sure botox is in the room  . EVALUATION UNDER ANESTHESIA WITH ANAL FISSUROTOMY N/A 07/23/2017   Procedure: EXAM UNDER ANESTHESIA WITH ANAL FISSUROTOMY, botox injection;  Surgeon: Jules Husbands, MD;  Location: [facility];  Service: General;  Laterality: N/A;  . HEMORRHOID SURGERY    . LEFT HEART CATH AND CORONARY ANGIOGRAPHY Right 10/30/2017   Procedure: LEFT HEART CATH AND CORONARY ANGIOGRAPHY;  Surgeon: Dionisio David, MD;  Location: [place] [facility];  Service: Cardiovascular;  Laterality: Right;  . ROTATOR CUFF REPAIR  1991  . TEE WITHOUT CARDIOVERSION N/A 11/03/2017   Procedure: TRANSESOPHAGEAL ECHOCARDIOGRAM (TEE);  Surgeon: Grace Isaac, MD;  Location: [place];  Service: Open Heart Surgery;  Laterality: N/A;    SOCIAL HISTORY:   Social History   Tobacco Use  . Smoking status: Former  Smoker    Packs/day: 0.50    Years: 20.00    Pack years: 10.00    Types: Cigarettes    Last attempt to quit: 06/16/2017    Years since quitting: 0.4  . Smokeless tobacco: Never Used  Substance Use Topics  . Alcohol use: No    Alcohol/week: 0.0 oz    FAMILY HISTORY:   Family History  Problem Relation Age of Onset  . Breast cancer Mother   . Huntington's disease Father   . Cancer Paternal Uncle        Colon  . Diabetes Son   . Cancer Maternal Grandfather   . Cancer Paternal Grandmother   .  Heart disease Paternal Grandmother   . Huntington's disease Paternal Grandmother     DRUG ALLERGIES:   Allergies  Allergen Reactions  . Bee Venom Swelling    SWELLING REACTION UNSPECIFIED  SEVERITY RATED HIGH FROM PMH  . Codeine Diarrhea, Nausea Only and Rash  . Sulfa Antibiotics Nausea And Vomiting    INCLUDES SPECIFICALLY SULFASALAZINE    REVIEW OF SYSTEMS:   Review of Systems  Constitutional: Negative.  Negative for chills, fever and malaise/fatigue.  HENT: Negative.  Negative for ear discharge, ear pain, hearing loss, nosebleeds and sore throat.   Eyes: Negative.  Negative for blurred vision and pain.  Respiratory: Negative.  Negative for cough, hemoptysis, shortness of breath and wheezing.   Cardiovascular: Positive for chest pain. Negative for palpitations and leg swelling.  Gastrointestinal: Negative.  Negative for abdominal pain, blood in stool, diarrhea, nausea and vomiting.  Genitourinary: Negative.  Negative for dysuria.  Musculoskeletal: Negative.  Negative for back pain.  Skin: Negative.   Neurological: Negative for dizziness, tremors, speech change, focal weakness, seizures and headaches.  Endo/Heme/Allergies: Negative.  Does not bruise/bleed easily.  Psychiatric/Behavioral: Negative.  Negative for depression, hallucinations and suicidal ideas.    MEDICATIONS AT HOME:   Prior to Admission medications   Medication Sig Start Date End Date Taking? Authorizing Provider  aspirin EC 325 MG EC tablet Take 1 tablet (325 mg total) by mouth daily. 11/08/17  Yes Conte, Tessa N, PA-C  atorvastatin (LIPITOR) 40 MG tablet Take 1 tablet (40 mg total) by mouth daily. 11/08/17  Yes Harriet Pho, Tessa N, PA-C  metoprolol tartrate (LOPRESSOR) 25 MG tablet Take 0.5 tablets (12.5 mg total) by mouth 2 (two) times daily. 11/08/17  Yes Conte, Tessa N, PA-C  potassium chloride SA (K-DUR,KLOR-CON) 20 MEQ tablet Take 1 tablet (20 mEq total) by mouth daily for 5 days. 11/08/17 11/19/17 Yes Conte, Terance Hart,  PA-C  acetaminophen (TYLENOL) 325 MG tablet Take 2 tablets (650 mg total) by mouth every 6 (six) hours as needed for mild pain. Patient not taking: Reported on 11/19/2017 11/08/17   Elgie Collard, PA-C  furosemide (LASIX) 40 MG tablet Take 1 tablet (40 mg total) by mouth daily for 5 days. Patient not taking: Reported on 11/19/2017 11/08/17 11/13/17  Elgie Collard, PA-C  hyoscyamine (ANASPAZ) 0.125 MG TBDP disintergrating tablet Place 0.125 mg under the tongue every 4 (four) hours as needed for cramping.     [provider]  ranitidine (ZANTAC) 150 MG tablet Take 1 tablet (150 mg total) by mouth 2 (two) times daily. 12/14/15   Funches, Adriana Mccallum, MD  simethicone (MYLICON) 80 MG chewable tablet Chew 1 tablet (80 mg total) by mouth 4 (four) times daily as needed for flatulence. Patient not taking: Reported on 11/19/2017 11/08/17   Elgie Collard, PA-C  traMADol Veatrice Bourbon)  50 MG tablet Take 1 tablet (50 mg total) by mouth every 6 (six) hours as needed for moderate pain. 11/08/17   Elgie Collard, PA-C      VITAL SIGNS:  Blood pressure (!) 142/84, pulse 88, temperature 97.7 F (36.5 C), temperature source Oral, resp. rate (!) 29, height 5\' 10"  (1.778 m), weight 58.5 kg (129 lb), SpO2 100 %.  PHYSICAL EXAMINATION:   Physical Exam  Constitutional: He is oriented to person, place, and time and well-developed, well-nourished, and in no distress. No distress.  HENT:  Head: Normocephalic.  Eyes: No scleral icterus.  Neck: Normal range of motion. Neck supple. No JVD present. No tracheal deviation present.  Cardiovascular: Normal rate, regular rhythm and normal heart sounds. Exam reveals no gallop and no friction rub.  No murmur heard. Patient has chest wall tenderness  Pulmonary/Chest: Effort normal and breath sounds normal. No respiratory distress. He has no wheezes. He has no rales. He exhibits no tenderness.  Abdominal: Soft. Bowel sounds are normal. He exhibits no distension and no mass. There is no  tenderness. There is no rebound and no guarding.  Musculoskeletal: Normal range of motion. He exhibits no edema.  Neurological: He is alert and oriented to person, place, and time.  Skin: Skin is warm. No rash noted. No erythema.  Psychiatric: Affect and judgment normal.      LABORATORY PANEL:   CBC Recent Labs  Lab 11/19/17 1435  WBC 5.6  HGB 11.6*  HCT 35.1*  PLT 516*   ------------------------------------------------------------------------------------------------------------------  Chemistries  Recent Labs  Lab 11/19/17 1435  NA 139  K 3.6  CL 105  CO2 26  GLUCOSE 99  BUN 9  CREATININE 0.69  CALCIUM 9.0  AST 23  ALT 52  ALKPHOS 91  BILITOT 0.7   ------------------------------------------------------------------------------------------------------------------  Cardiac Enzymes Recent Labs  Lab 11/19/17 1435  TROPONINI 0.04*   ------------------------------------------------------------------------------------------------------------------  RADIOLOGY:  Dg Chest 2 View  Result Date: 11/19/2017 CLINICAL DATA:  Chest pain. EXAM: CHEST  2 VIEW COMPARISON:  Radiographs of November 06, 2017. FINDINGS: The heart size and mediastinal contours are within normal limits. Both lungs are clear. No pneumothorax or pleural effusion is noted. Status post coronary artery bypass graft. Old bilateral rib fractures are noted. IMPRESSION: No active cardiopulmonary disease. Electronically Signed   By: Marijo Conception, M.D.   On: 11/19/2017 14:34    EKG:   Sinus rhythm with T-wave inversions in the septal leads  IMPRESSION AND PLAN:   52 year old male with recent 4 vessel CABG at [facility] who presents with chest pain.  1. Non-ST elevation MI: Cardiology has recommended no anticoagulation for now due to the fact patient has chest wall tenderness on examination. Following telemetry Follow troponin Continue aspirin, statin and metoprolol  2. Essential hypertension:  Continue metoprolol  3. GERD: Continue H2 blocker    All the records are reviewed and case discussed with ED provider. Management plans discussed with the patient and he is in agreement  CODE STATUS: full  TOTAL TIME TAKING CARE OF THIS PATIENT: 41 minutes.    Delonte Musich M.D on 11/19/2017 at 3:54 PM  Between 7am to 6pm - Pager - [phone]  After 6pm go to [email or web address] - password [email or web address] [facility]  Office  [phone]  CC: Primary care physician; Perrin Maltese, MD

## 2017-11-19 NOTE — ED Notes (Signed)
Admitting MD has been at pts bedside for assessment. PT remains in NAD at this time.

## 2017-11-19 NOTE — Progress Notes (Signed)
I verified this patient's heart monitor after first verified by Mykia, N.T. with C.C.M.D. upon patient admission to the unit. Wenda Low [facility]

## 2017-11-19 NOTE — Plan of Care (Signed)
  Progressing Clinical Measurements: Diagnostic test results will improve 11/19/2017 2006 - Progressing by Jeri Cos, RN Pain Managment: General experience of comfort will improve 11/19/2017 2006 - Progressing by Jeri Cos, RN

## 2017-11-19 NOTE — ED Provider Notes (Addendum)
[facility] Emergency Department Provider Note  ____________________________________________   I have reviewed the triage vital signs and the nursing notes. Where available I have reviewed prior notes and, if possible and indicated, outside hospital notes.  Pain is substernal and on the left chest wall   HISTORY  Chief Complaint Chest Pain    HPI Jeffrey Prince is a 52 y.o. male resents today complaining of feeling chest wall discomfort and chest pain, states that is been going on since his quadruple bypass he was discharged for less than a week ago.  He denies any fever chills nausea or vomiting.  He states sometimes he feels "a little" short of breath.  The pain is an ache.  It does not radiate.  Is there all the time for the last several days.  Very difficult for him to otherwise describe.  Does not appear to be exertional, worse when he moves his arm sometimes.  No leg swelling, no headache.  Nothing makes it better, he cannot tell me if it is exactly the same as his prior chest pain that got him the CABG. No prior treatment for this discomfort has not yet followed up with cardiology  Past Medical History:  Diagnosis Date  . Acne   . Blind left eye    Since birth  . Chorea   . Diverticulosis   . ED (erectile dysfunction)   . GERD (gastroesophageal reflux disease)   . Hemorrhoids   . Weight loss    Abnormal    Patient Active Problem List   Diagnosis Date Noted  . Coronary artery disease 11/03/2017  . ACS (acute coronary syndrome) ([place]) 10/31/2017  . Chest pain 10/30/2017  . Anal fissure   . Family history of Huntington's chorea 07/01/2017  . Unintentional weight loss 05/16/2015  . Diarrhea 05/16/2015  . GERD (gastroesophageal reflux disease) 05/16/2015  . Diffuse abdominal pain 05/16/2015  . Dyslipidemia (high LDL; low HDL) 01/25/2015  . Decreased visual acuity 01/24/2015  . Erectile dysfunction 01/24/2015  . Current every day smoker  01/24/2015  . 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 Hemorrhoids 03/16/2014    Past Surgical History:  Procedure Laterality Date  . BOTOX INJECTION N/A 10/09/2016   Procedure: BOTOX INJECTION;  Surgeon: Jules Husbands, MD;  Location: [facility];  Service: General;  Laterality: N/A;  . BOTOX INJECTION N/A 07/23/2017   Procedure: BOTOX INJECTION;  Surgeon: Jules Husbands, MD;  Location: [facility];  Service: General;  Laterality: N/A;  . COLONOSCOPY     with polyp removal  . CORONARY ARTERY BYPASS GRAFT N/A 11/03/2017   Procedure: CORONARY ARTERY BYPASS GRAFTING (CABG) x four, using left internal mammry artery and right leg greater saphenous vein harvested endoscopically (LIMA to LAD, SVG to OM1, SVG to RAMUS INTERMEDIATE, SVG to PDA)  ;  Surgeon: Grace Isaac, MD;  Location: [place];  Service: Open Heart Surgery;  Laterality: N/A;  . EVALUATION UNDER ANESTHESIA WITH ANAL FISSUROTOMY N/A 10/09/2016   Procedure: EXAM UNDER ANESTHESIA WITH ANAL FISSUROTOMY chemical sphincterotomy w BOtox.;  Surgeon: Jules Husbands, MD;  Location: [facility];  Service: General;  Laterality: N/A;  Lithotomy, make sure botox is in the room  . EVALUATION UNDER ANESTHESIA WITH ANAL FISSUROTOMY N/A 07/23/2017   Procedure: EXAM UNDER ANESTHESIA WITH ANAL FISSUROTOMY, botox injection;  Surgeon: Jules Husbands, MD;  Location: [facility];  Service: General;  Laterality: N/A;  . HEMORRHOID SURGERY    . LEFT HEART CATH AND CORONARY  ANGIOGRAPHY Right 10/30/2017   Procedure: LEFT HEART CATH AND CORONARY ANGIOGRAPHY;  Surgeon: Dionisio David, MD;  Location: [place] [facility];  Service: Cardiovascular;  Laterality: Right;  . ROTATOR CUFF REPAIR  1991  . TEE WITHOUT CARDIOVERSION N/A 11/03/2017   Procedure: TRANSESOPHAGEAL ECHOCARDIOGRAM (TEE);  Surgeon: Grace Isaac, MD;  Location: [place];  Service: Open Heart Surgery;  Laterality: N/A;    Prior to Admission medications   Medication Sig Start Date End Date Taking?  Authorizing Provider  acetaminophen (TYLENOL) 325 MG tablet Take 2 tablets (650 mg total) by mouth every 6 (six) hours as needed for mild pain. 11/08/17   Elgie Collard, PA-C  aspirin EC 325 MG EC tablet Take 1 tablet (325 mg total) by mouth daily. 11/08/17   Elgie Collard, PA-C  atorvastatin (LIPITOR) 40 MG tablet Take 1 tablet (40 mg total) by mouth daily. 11/08/17   Elgie Collard, PA-C  furosemide (LASIX) 40 MG tablet Take 1 tablet (40 mg total) by mouth daily for 5 days. 11/08/17 11/13/17  Elgie Collard, PA-C  hyoscyamine (ANASPAZ) 0.125 MG TBDP disintergrating tablet Place 0.125 mg under the tongue every 4 (four) hours as needed for cramping.     [provider]  metoprolol tartrate (LOPRESSOR) 25 MG tablet Take 0.5 tablets (12.5 mg total) by mouth 2 (two) times daily. 11/08/17   Elgie Collard, PA-C  potassium chloride SA (K-DUR,KLOR-CON) 20 MEQ tablet Take 1 tablet (20 mEq total) by mouth daily for 5 days. 11/08/17 11/13/17  Elgie Collard, PA-C  ranitidine (ZANTAC) 150 MG tablet Take 1 tablet (150 mg total) by mouth 2 (two) times daily. 12/14/15   Funches, Adriana Mccallum, MD  simethicone (MYLICON) 80 MG chewable tablet Chew 1 tablet (80 mg total) by mouth 4 (four) times daily as needed for flatulence. 11/08/17   Elgie Collard, PA-C  traMADol (ULTRAM) 50 MG tablet Take 1 tablet (50 mg total) by mouth every 6 (six) hours as needed for moderate pain. 11/08/17   Elgie Collard, PA-C    Allergies Bee venom; Codeine; and Sulfa antibiotics  Family History  Problem Relation Age of Onset  . Breast cancer Mother   . Huntington's disease Father   . Cancer Paternal Uncle        Colon  . Diabetes Son   . Cancer Maternal Grandfather   . Cancer Paternal Grandmother   . Heart disease Paternal Grandmother   . Huntington's disease Paternal Grandmother     Social History Social History   Tobacco Use  . Smoking status: Former Smoker    Packs/day: 0.50    Years: 20.00    Pack years: 10.00    Types:  Cigarettes    Last attempt to quit: 06/16/2017    Years since quitting: 0.4  . Smokeless tobacco: Never Used  Substance Use Topics  . Alcohol use: No    Alcohol/week: 0.0 oz  . Drug use: No    Review of Systems Constitutional: No fever/chills Eyes: No visual changes. ENT: No sore throat. No stiff neck no neck pain Cardiovascular: + chest pain. Respiratory: Denies shortness of breath. Gastrointestinal:   no vomiting.  No diarrhea.  No constipation. Genitourinary: Negative for dysuria. Musculoskeletal: Negative lower extremity swelling Skin: Negative for rash. Neurological: Negative for severe headaches, focal weakness or numbness.   ____________________________________________   PHYSICAL EXAM:  VITAL SIGNS: ED Triage Vitals  Enc Vitals Group     BP 11/19/17 1355 122/72  Pulse Rate 11/19/17 1355 78     Resp 11/19/17 1355 16     Temp 11/19/17 1355 97.7 F (36.5 C)     Temp Source 11/19/17 1355 Oral     SpO2 11/19/17 1355 100 %     Weight 11/19/17 1357 129 lb (58.5 kg)     Height 11/19/17 1357 5\' 10"  (1.778 m)     Head Circumference --      Peak Flow --      Pain Score --      Pain Loc --      Pain Edu? --      Excl. in [place]? --     Constitutional: Alert and oriented. Well appearing and in no acute distress. Eyes: Conjunctivae are normal Head: Atraumatic HEENT: No congestion/rhinnorhea. Mucous membranes are moist.  Oropharynx non-erythematous Neck:   Nontender with no meningismus, no masses, no stridor Cardiovascular: Normal rate, regular rhythm. Grossly normal heart sounds.  Good peripheral circulation. Respiratory: Normal respiratory effort.  No retractions. Lungs CTAB. Abdominal: Soft and nontender. No distention. No guarding no rebound Chest: Tender palpation left chest wall and a chest x-ray patient states "ouch that the pain right there and feels back, incision is clean dry and intact with no evidence of infection or open, no seroma or abscess noted, chest  wall is otherwise normal appearance with no crepitus or flail chest Back:  There is no focal tenderness or step off.  there is no midline tenderness there are no lesions noted. there is no CVA tenderness Musculoskeletal: No lower extremity tenderness, no upper extremity tenderness. No joint effusions, no DVT signs strong distal pulses no edema Neurologic:  Normal speech and language. No gross focal neurologic deficits are appreciated.  Skin:  Skin is warm, dry and intact. No rash noted. Psychiatric: Mood and affect are normal. Speech and behavior are normal.  ____________________________________________   LABS (all labs ordered are listed, but only abnormal results are displayed)  Labs Reviewed  CBC WITH DIFFERENTIAL/PLATELET  COMPREHENSIVE METABOLIC PANEL  TROPONIN I  URINE DRUG SCREEN, QUALITATIVE ([facility] ONLY)  PROTIME-INR  APTT    Pertinent labs  results that were available during my care of the patient were reviewed by me and considered in my medical decision making (see chart for details). ____________________________________________  EKG  I personally interpreted any EKGs ordered by me or triage Normal sinus rhythm, rate 67 bpm, flipped T waves noted diffusely, no acute ST elevation or depression, normal axis, flipped T waves are distinct from prior EKG which appears showed more repull pattern ____________________________________________  RADIOLOGY  Pertinent labs & imaging results that were available during my care of the patient were reviewed by me and considered in my medical decision making (see chart for details). If possible, patient and/or family made aware of any abnormal findings.  No results found. ____________________________________________    PROCEDURES  Procedure(s) performed: None  Procedures  Critical Care performed: None  ____________________________________________   INITIAL IMPRESSION / ASSESSMENT AND PLAN / ED COURSE  Pertinent labs &  imaging results that were available during my care of the patient were reviewed by me and considered in my medical decision making (see chart for details).  Patient here with chest pain, he did recently have a quadruple bypass.  Given that, obviously we will check cardiac enzymes, there is some degree of reproducibility to it, he does have a however also EKG changes.  We likely will end up having to discuss with cardiology and determine the necessity  for admission after cardiac enzymes.  He is in minimal discomfort right now,  . ----------------------------------------- 3:28 PM on 11/19/2017 -----------------------------------------  Patient troponin is borderline, I did discuss with Dr. Humphrey Rolls who knows the patient well who does strongly recommend admission.  He did state that he does not wish the patient to be heparinized but he thinks nitroglycerin is a good choice for him.  We have given already aspirin.  Patient is in no acute distress at this time, has had pain uninterruptedly for 4 days with a reassuring troponin most likely I feel this is chest wall pain but given extensive recent cardiac history and change in EKG which cardiology feels is likely secondary to reperfusion itself, we will admit for further evaluation    ____________________________________________   FINAL CLINICAL IMPRESSION(S) / ED DIAGNOSES  Final diagnoses:  None      This chart was dictated using voice recognition software.  Despite best efforts to proofread,  errors can occur which can change meaning.      Schuyler Amor, MD 11/19/17 1426    Schuyler Amor, MD 11/19/17 [phone]

## 2017-11-19 NOTE — ED Triage Notes (Signed)
Pt reports that he had bypass surgery on the 8th of this month. Yesterday he developed left sided chest pain. Denies nausea but has been having diaphoresis. Denies radiation of chest pain.

## 2017-11-20 ENCOUNTER — Encounter
Admission: EM | Disposition: A | Discharge status: Home / Self Care | Payer: Self-pay | Source: Home / Self Care | Attending: Internal Medicine

## 2017-11-20 ENCOUNTER — Inpatient Hospital Stay
Admit: 2017-11-20 | Discharge: 2017-11-20 | Disposition: A | Discharge status: Home / Self Care | Payer: BLUE CROSS/BLUE SHIELD | Attending: Cardiovascular Disease | Admitting: Cardiovascular Disease

## 2017-11-20 HISTORY — PX: LEFT HEART CATH AND CORONARY ANGIOGRAPHY: CATH118249

## 2017-11-20 LAB — CBC
HCT: 32.6 % — ABNORMAL LOW (ref 40.0–52.0)
HEMATOCRIT: 33 % — AB (ref 40.0–52.0)
Hemoglobin: 11.1 g/dL — ABNORMAL LOW (ref 13.0–18.0)
Hemoglobin: 11 g/dL — ABNORMAL LOW (ref 13.0–18.0)
MCH: 28.8 pg (ref 26.0–34.0)
MCH: 28.8 pg (ref 26.0–34.0)
MCHC: 33.6 g/dL (ref 32.0–36.0)
MCHC: 33.6 g/dL (ref 32.0–36.0)
MCV: 85.5 fL (ref 80.0–100.0)
MCV: 85.7 fL (ref 80.0–100.0)
PLATELETS: 440 10*3/uL (ref 150–440)
Platelets: 431 10*3/uL (ref 150–440)
RBC: 3.86 MIL/uL — ABNORMAL LOW (ref 4.40–5.90)
RBC: 3.8 MIL/uL — AB (ref 4.40–5.90)
RDW: 14 % (ref 11.5–14.5)
RDW: 14 % (ref 11.5–14.5)
WBC: 3.9 10*3/uL (ref 3.8–10.6)
WBC: 3.5 10*3/uL — AB (ref 3.8–10.6)

## 2017-11-20 LAB — BASIC METABOLIC PANEL
ANION GAP: 10 (ref 5–15)
Anion gap: 9 (ref 5–15)
BUN: 10 mg/dL (ref 6–20)
BUN: 8 mg/dL (ref 6–20)
CALCIUM: 8.7 mg/dL — AB (ref 8.9–10.3)
CHLORIDE: 107 mmol/L (ref 101–111)
CO2: 24 mmol/L (ref 22–32)
CO2: 23 mmol/L (ref 22–32)
Calcium: 8.7 mg/dL — ABNORMAL LOW (ref 8.9–10.3)
Chloride: 106 mmol/L (ref 101–111)
Creatinine, Ser: 0.67 mg/dL (ref 0.61–1.24)
Creatinine, Ser: 0.71 mg/dL (ref 0.61–1.24)
GFR calc Af Amer: >60 mL/min (ref >60)
GFR calc non Af Amer: >60 mL/min (ref >60)
Glucose, Bld: 81 mg/dL (ref 65–99)
Glucose, Bld: 66 mg/dL (ref 65–99)
POTASSIUM: 3.7 mmol/L (ref 3.5–5.1)
POTASSIUM: 3.4 mmol/L — AB (ref 3.5–5.1)
SODIUM: 140 mmol/L (ref 135–145)
Sodium: 139 mmol/L (ref 135–145)

## 2017-11-20 LAB — TROPONIN I
TROPONIN I: 0.03 ng/mL — AB (ref <0.03)
Troponin I: 0.03 ng/mL (ref <0.03)

## 2017-11-20 LAB — HEMOGLOBIN A1C
Hgb A1c MFr Bld: 5.2 % (ref 4.8–5.6)
Mean Plasma Glucose: 102.54 mg/dL

## 2017-11-20 LAB — PROTIME-INR
INR: 1.12
PROTHROMBIN TIME: 14.3 s (ref 11.4–15.2)

## 2017-11-20 SURGERY — LEFT HEART CATH AND CORONARY ANGIOGRAPHY
Anesthesia: Moderate Sedation | Laterality: Right

## 2017-11-20 MED ORDER — ASPIRIN 81 MG PO CHEW: 81.0000 mg | CHEWABLE_TABLET | ORAL | Status: DC

## 2017-11-20 MED ORDER — FENTANYL CITRATE (PF) 100 MCG/2ML IJ SOLN
INTRAMUSCULAR | Status: DC | PRN
  Administered 2017-11-20 (×2): 25 ug via INTRAVENOUS

## 2017-11-20 MED ORDER — SODIUM CHLORIDE 0.9 % IV SOLN: 250.0000 mL | INTRAVENOUS | Status: DC | PRN

## 2017-11-20 MED ORDER — MIDAZOLAM HCL 2 MG/2ML IJ SOLN
INTRAMUSCULAR | Status: AC
  Filled 2017-11-20: qty 2

## 2017-11-20 MED ORDER — SODIUM CHLORIDE 0.9 % WEIGHT BASED INFUSION
3.0000 mL/kg/h | INTRAVENOUS | Status: DC
  Administered 2017-11-20: 3 mL/kg/h via INTRAVENOUS

## 2017-11-20 MED ORDER — FENTANYL CITRATE (PF) 100 MCG/2ML IJ SOLN
INTRAMUSCULAR | Status: AC
  Filled 2017-11-20: qty 2

## 2017-11-20 MED ORDER — HEPARIN (PORCINE) IN NACL 2-0.9 UNIT/ML-% IJ SOLN
INTRAMUSCULAR | Status: AC
  Filled 2017-11-20: qty 500

## 2017-11-20 MED ORDER — SODIUM CHLORIDE 0.9 % WEIGHT BASED INFUSION: 1.0000 mL/kg/h | INTRAVENOUS | Status: DC

## 2017-11-20 MED ORDER — SODIUM CHLORIDE 0.9% FLUSH: 3.0000 mL | Freq: Two times a day (BID) | INTRAVENOUS | Status: DC

## 2017-11-20 MED ORDER — IOPAMIDOL (ISOVUE-300) INJECTION 61%
INTRAVENOUS | Status: DC | PRN
  Administered 2017-11-20: 155 mL via INTRA_ARTERIAL

## 2017-11-20 MED ORDER — MIDAZOLAM HCL 2 MG/2ML IJ SOLN
INTRAMUSCULAR | Status: DC | PRN
  Administered 2017-11-20 (×2): 1 mg via INTRAVENOUS

## 2017-11-20 MED ORDER — SODIUM CHLORIDE 0.9 % WEIGHT BASED INFUSION: 3.0000 mL/kg/h | INTRAVENOUS | Status: DC

## 2017-11-20 MED ORDER — ISOSORBIDE MONONITRATE ER 30 MG PO TB24: 30.0000 mg | ORAL_TABLET | Freq: Every day | ORAL | 1 refills | Status: AC

## 2017-11-20 MED ORDER — SODIUM CHLORIDE 0.9% FLUSH: 3.0000 mL | INTRAVENOUS | Status: DC | PRN

## 2017-11-20 MED ORDER — SODIUM CHLORIDE 0.9 % WEIGHT BASED INFUSION: 3.0000 mL/kg/h | INTRAVENOUS | Status: AC

## 2017-11-20 SURGICAL SUPPLY — 12 items
CATH ANGIO 5F JB2 100CM (CATHETERS) ×3 IMPLANT
CATH INFINITI 5 FR IM (CATHETERS) ×3 IMPLANT
CATH INFINITI 5FR ANG PIGTAIL (CATHETERS) ×3 IMPLANT
CATH INFINITI 5FR JL4 (CATHETERS) ×3 IMPLANT
CATH INFINITI JR4 5F (CATHETERS) ×3 IMPLANT
DEVICE CLOSURE MYNXGRIP 5F (Vascular Products) ×3 IMPLANT
KIT MANI 3VAL PERCEP (MISCELLANEOUS) ×3 IMPLANT
NEEDLE PERC 18GX7CM (NEEDLE) ×3 IMPLANT
PACK CARDIAC CATH (CUSTOM PROCEDURE TRAY) ×3 IMPLANT
SHEATH PINNACLE 5F 10CM (SHEATH) ×3 IMPLANT
WIRE EMERALD 3MM-J .035X260CM (WIRE) ×3 IMPLANT
WIRE GUIDERIGHT .035X150 (WIRE) ×3 IMPLANT

## 2017-11-20 NOTE — Progress Notes (Signed)
Pt d/c'd. No complaints of pain at this time. Site dry with no signs of bleeding.

## 2017-11-20 NOTE — Progress Notes (Signed)
*  PRELIMINARY RESULTS* Echocardiogram 2D Echocardiogram has been performed.  Jeffrey Prince 11/20/2017, 5:08 PM

## 2017-11-20 NOTE — Plan of Care (Signed)
  Progressing Clinical Measurements: Ability to maintain clinical measurements within normal limits will improve 11/20/2017 1342 - Progressing by Liliane Channel, RN Diagnostic test results will improve 11/20/2017 1342 - Progressing by Liliane Channel, RN Cardiovascular complication will be avoided 11/20/2017 1342 - Progressing by Liliane Channel, RN 11/20/2017 1342 - Progressing by Liliane Channel, RN Pain Managment: General experience of comfort will improve 11/20/2017 1342 - Progressing by Liliane Channel, RN Safety: Ability to remain free from injury will improve 11/20/2017 1342 - Progressing by Liliane Channel, RN 11/20/2017 1342 - Progressing by Liliane Channel, RN

## 2017-11-20 NOTE — Care Management (Signed)
At present, it does not appear is  being discharged home on cost prohibitive antiplatelet medication

## 2017-11-20 NOTE — Progress Notes (Signed)
Cardiac cath shows all grafts are patent with normal LVEF. Non cardiac chest pain, may go home with f/u Monday 10 AM.

## 2017-11-20 NOTE — Progress Notes (Signed)
ECHO being done at bedside. Report called to floor RN, [place] now. Right groin without hematoma, edema, ecchymosis, drainage. Pt. Tolerating sips of fluids now.

## 2017-11-20 NOTE — Consult Note (Signed)
Jeffrey Prince is a 52 y.o. male  [id number]  Primary Cardiologist: Neoma Laming Reason for Consultation: Unstable angina  HPI: This is a 52 year old African-American male with a past medical history of coronary artery disease status post CABG a month ago presented to the hospital with chest pain again described as pressure type associated with shortness of breath and diaphoresis.   Review of Systems: No orthopnea PND or leg swelling   Past Medical History:  Diagnosis Date  . Acne   . Blind left eye    Since birth  . Chorea   . Diverticulosis   . ED (erectile dysfunction)   . GERD (gastroesophageal reflux disease)   . Hemorrhoids   . Weight loss    Abnormal    Medications Prior to Admission  Medication Sig Dispense Refill  . aspirin EC 325 MG EC tablet Take 1 tablet (325 mg total) by mouth daily. 30 tablet 0  . atorvastatin (LIPITOR) 40 MG tablet Take 1 tablet (40 mg total) by mouth daily. 30 tablet 0  . metoprolol tartrate (LOPRESSOR) 25 MG tablet Take 0.5 tablets (12.5 mg total) by mouth 2 (two) times daily. 30 tablet 1  . potassium chloride SA (K-DUR,KLOR-CON) 20 MEQ tablet Take 1 tablet (20 mEq total) by mouth daily for 5 days. 5 tablet 0  . acetaminophen (TYLENOL) 325 MG tablet Take 2 tablets (650 mg total) by mouth every 6 (six) hours as needed for mild pain. (Patient not taking: Reported on 11/19/2017)    . furosemide (LASIX) 40 MG tablet Take 1 tablet (40 mg total) by mouth daily for 5 days. (Patient not taking: Reported on 11/19/2017) 5 tablet 0  . hyoscyamine (ANASPAZ) 0.125 MG TBDP disintergrating tablet Place 0.125 mg under the tongue every 4 (four) hours as needed for cramping.     . ranitidine (ZANTAC) 150 MG tablet Take 1 tablet (150 mg total) by mouth 2 (two) times daily. 30 tablet 5  . simethicone (MYLICON) 80 MG chewable tablet Chew 1 tablet (80 mg total) by mouth 4 (four) times daily as needed for flatulence. (Patient not taking: Reported on 11/19/2017) 30  tablet 0  . traMADol (ULTRAM) 50 MG tablet Take 1 tablet (50 mg total) by mouth every 6 (six) hours as needed for moderate pain. 30 tablet 0     . aspirin  325 mg Oral Daily  . atorvastatin  40 mg Oral Daily  . enoxaparin (LOVENOX) injection  40 mg Subcutaneous Q24H  . famotidine  20 mg Oral BID  . metoprolol tartrate  12.5 mg Oral BID  . sodium chloride flush  3 mL Intravenous Q12H    Infusions: . sodium chloride      Allergies  Allergen Reactions  . Bee Venom Swelling    SWELLING REACTION UNSPECIFIED  SEVERITY RATED HIGH FROM PMH  . Codeine Diarrhea, Nausea Only and Rash  . Sulfa Antibiotics Nausea And Vomiting    INCLUDES SPECIFICALLY SULFASALAZINE    Social History   Socioeconomic History  . Marital status: Single    Spouse name: Not on file  . Number of children: Not on file  . Years of education: Not on file  . Highest education level: Not on file  Social Needs  . Financial resource strain: Not on file  . Food insecurity - worry: Not on file  . Food insecurity - inability: Not on file  . Transportation needs - medical: Not on file  . Transportation needs - non-medical: Not on file  Occupational History  . Not on file  Tobacco Use  . Smoking status: Former Smoker    Packs/day: 0.50    Years: 20.00    Pack years: 10.00    Types: Cigarettes    Last attempt to quit: 06/16/2017    Years since quitting: 0.4  . Smokeless tobacco: Never Used  Substance and Sexual Activity  . Alcohol use: No    Alcohol/week: 0.0 oz  . Drug use: No  . Sexual activity: Not on file  Other Topics Concern  . Not on file  Social History Narrative  . Not on file    Family History  Problem Relation Age of Onset  . Breast cancer Mother   . Huntington's disease Father   . Cancer Paternal Uncle        Colon  . Diabetes Son   . Cancer Maternal Grandfather   . Cancer Paternal Grandmother   . Heart disease Paternal Grandmother   . Huntington's disease Paternal Grandmother      PHYSICAL EXAM: Vitals:   11/20/17 0347 11/20/17 0728  BP: (!) 91/57 106/66  Pulse: 76 67  Resp: 17   Temp: 99 F (37.2 C) 98.4 F (36.9 C)  SpO2: 100% 100%     Intake/Output Summary (Last 24 hours) at 11/20/2017 0844 Last data filed at 11/20/2017 0300 Gross per 24 hour  Intake 120 ml  Output 0 ml  Net 120 ml    General:  Well appearing. No respiratory difficulty HEENT: normal Neck: supple. no JVD. Carotids 2+ bilat; no bruits. No lymphadenopathy or thryomegaly appreciated. Cor: PMI nondisplaced. Regular rate & rhythm. No rubs, gallops or murmurs. Lungs: clear Abdomen: soft, nontender, nondistended. No hepatosplenomegaly. No bruits or masses. Good bowel sounds. Extremities: no cyanosis, clubbing, rash, edema Neuro: alert & oriented x 3, cranial nerves grossly intact. moves all 4 extremities w/o difficulty. Affect pleasant.  ECG: Normal sinus rhythm with nonspecific ST-T changes  Results for orders placed or performed during the hospital encounter of 11/19/17 (from the past 24 hour(s))  CBC with Differential     Status: Abnormal   Collection Time: 11/19/17  2:35 PM  Result Value Ref Range   WBC 5.6 3.8 - 10.6 K/uL   RBC 4.07 (L) 4.40 - 5.90 MIL/uL   Hemoglobin 11.6 (L) 13.0 - 18.0 g/dL   HCT 35.1 (L) 40.0 - 52.0 %   MCV 86.2 80.0 - 100.0 fL   MCH 28.5 26.0 - 34.0 pg   MCHC 33.0 32.0 - 36.0 g/dL   RDW 14.0 11.5 - 14.5 %   Platelets 516 (H) 150 - 440 K/uL   Neutrophils Relative % 70 %   Neutro Abs 3.9 1.4 - 6.5 K/uL   Lymphocytes Relative 16 %   Lymphs Abs 0.9 (L) 1.0 - 3.6 K/uL   Monocytes Relative 9 %   Monocytes Absolute 0.5 0.2 - 1.0 K/uL   Eosinophils Relative 4 %   Eosinophils Absolute 0.2 0 - 0.7 K/uL   Basophils Relative 1 %   Basophils Absolute 0.1 0 - 0.1 K/uL  Comprehensive metabolic panel     Status: None   Collection Time: 11/19/17  2:35 PM  Result Value Ref Range   Sodium 139 135 - 145 mmol/L   Potassium 3.6 3.5 - 5.1 mmol/L   Chloride 105 101  - 111 mmol/L   CO2 26 22 - 32 mmol/L   Glucose, Bld 99 65 - 99 mg/dL   BUN 9 6 - 20 mg/dL  Creatinine, Ser 0.69 0.61 - 1.24 mg/dL   Calcium 9.0 8.9 - 10.3 mg/dL   Total Protein 7.7 6.5 - 8.1 g/dL   Albumin 3.6 3.5 - 5.0 g/dL   AST 23 15 - 41 U/L   ALT 52 17 - 63 U/L   Alkaline Phosphatase 91 38 - 126 U/L   Total Bilirubin 0.7 0.3 - 1.2 mg/dL   GFR calc non Af Amer >60 >60 mL/min   GFR calc Af Amer >60 >60 mL/min   Anion gap 8 5 - 15  Troponin I     Status: Abnormal   Collection Time: 11/19/17  2:35 PM  Result Value Ref Range   Troponin I 0.04 (HH) <0.03 ng/mL  Protime-INR     Status: None   Collection Time: 11/19/17  2:35 PM  Result Value Ref Range   Prothrombin Time 14.0 11.4 - 15.2 seconds   INR 1.09   APTT     Status: None   Collection Time: 11/19/17  2:35 PM  Result Value Ref Range   aPTT 33 24 - 36 seconds  Urine Drug Screen, Qualitative     Status: None   Collection Time: 11/19/17  3:37 PM  Result Value Ref Range   Tricyclic, Ur Screen NONE DETECTED NONE DETECTED   Amphetamines, Ur Screen NONE DETECTED NONE DETECTED   MDMA (Ecstasy)Ur Screen NONE DETECTED NONE DETECTED   Cocaine Metabolite,Ur [place] NONE DETECTED NONE DETECTED   Opiate, Ur Screen NONE DETECTED NONE DETECTED   Phencyclidine (PCP) Ur S NONE DETECTED NONE DETECTED   Cannabinoid 50 Ng, Ur [place] NONE DETECTED NONE DETECTED   Barbiturates, Ur Screen NONE DETECTED NONE DETECTED   Benzodiazepine, Ur Scrn NONE DETECTED NONE DETECTED   Methadone Scn, Ur NONE DETECTED NONE DETECTED  CBC     Status: Abnormal   Collection Time: 11/19/17  5:38 PM  Result Value Ref Range   WBC 5.0 3.8 - 10.6 K/uL   RBC 4.19 (L) 4.40 - 5.90 MIL/uL   Hemoglobin 12.0 (L) 13.0 - 18.0 g/dL   HCT 35.5 (L) 40.0 - 52.0 %   MCV 84.8 80.0 - 100.0 fL   MCH 28.8 26.0 - 34.0 pg   MCHC 33.9 32.0 - 36.0 g/dL   RDW 13.9 11.5 - 14.5 %   Platelets 514 (H) 150 - 440 K/uL  Creatinine, serum     Status: Abnormal   Collection Time: 11/19/17  5:38 PM   Result Value Ref Range   Creatinine, Ser 0.60 (L) 0.61 - 1.24 mg/dL   GFR calc non Af Amer >60 >60 mL/min   GFR calc Af Amer >60 >60 mL/min  Troponin I     Status: Abnormal   Collection Time: 11/19/17  5:38 PM  Result Value Ref Range   Troponin I 0.04 (HH) <0.03 ng/mL  Lipid panel     Status: Abnormal   Collection Time: 11/19/17  5:38 PM  Result Value Ref Range   Cholesterol 106 0 - 200 mg/dL   Triglycerides 53 <150 mg/dL   HDL 36 (L) >40 mg/dL   Total CHOL/HDL Ratio 2.9 RATIO   VLDL 11 0 - 40 mg/dL   LDL Cholesterol 59 0 - 99 mg/dL  Troponin I     Status: Abnormal   Collection Time: 11/20/17 12:06 AM  Result Value Ref Range   Troponin I 0.03 (HH) <0.03 ng/mL  Troponin I     Status: Abnormal   Collection Time: 11/20/17  5:31 AM  Result Value Ref  Range   Troponin I 0.03 (HH) <0.03 ng/mL  Basic metabolic panel     Status: Abnormal   Collection Time: 11/20/17  5:31 AM  Result Value Ref Range   Sodium 140 135 - 145 mmol/L   Potassium 3.7 3.5 - 5.1 mmol/L   Chloride 107 101 - 111 mmol/L   CO2 24 22 - 32 mmol/L   Glucose, Bld 81 65 - 99 mg/dL   BUN 10 6 - 20 mg/dL   Creatinine, Ser 0.67 0.61 - 1.24 mg/dL   Calcium 8.7 (L) 8.9 - 10.3 mg/dL   GFR calc non Af Amer >60 >60 mL/min   GFR calc Af Amer >60 >60 mL/min   Anion gap 9 5 - 15  CBC     Status: Abnormal   Collection Time: 11/20/17  5:31 AM  Result Value Ref Range   WBC 3.9 3.8 - 10.6 K/uL   RBC 3.86 (L) 4.40 - 5.90 MIL/uL   Hemoglobin 11.1 (L) 13.0 - 18.0 g/dL   HCT 33.0 (L) 40.0 - 52.0 %   MCV 85.5 80.0 - 100.0 fL   MCH 28.8 26.0 - 34.0 pg   MCHC 33.6 32.0 - 36.0 g/dL   RDW 14.0 11.5 - 14.5 %   Platelets 431 150 - 440 K/uL   Dg Chest 2 View  Result Date: 11/19/2017 CLINICAL DATA:  Chest pain. EXAM: CHEST  2 VIEW COMPARISON:  Radiographs of November 06, 2017. FINDINGS: The heart size and mediastinal contours are within normal limits. Both lungs are clear. No pneumothorax or pleural effusion is noted. Status post  coronary artery bypass graft. Old bilateral rib fractures are noted. IMPRESSION: No active cardiopulmonary disease. Electronically Signed   By: Marijo Conception, M.D.   On: 11/19/2017 14:34     ASSESSMENT AND PLAN: Unstable angina with inverted T waves in the anterior leads status post CABG 5 vessel. If patient continues to have chest pain and will consider cardiac catheterization tomorrow.  Beverly Ferner A

## 2017-11-20 NOTE — Progress Notes (Signed)
Pt states that his uncle was his legal guardian, but they didn't went to court to formalize the form. Pt states that his uncle aware that he will have cardiac cath done this afternoon (11/20/17). Will continue to monitor

## 2017-11-20 NOTE — Progress Notes (Signed)
Pt refused bed alarm but was educated about safety

## 2017-11-20 NOTE — Progress Notes (Signed)
Pt d/c to home today.  IV removed intact.  D/c paperwork printed and reviewed w/pt.  All medication questions and concerns reviewed and pt states understanding.  All Rx's given to patient. Pt requested to wheelchaired out for d/c.    

## 2017-11-20 NOTE — Progress Notes (Signed)
Will do cath this afternoon.

## 2017-11-21 ENCOUNTER — Encounter: Payer: Self-pay | Admitting: Cardiovascular Disease

## 2017-11-21 LAB — ECHOCARDIOGRAM COMPLETE
HEIGHTINCHES: 62 in
WEIGHTICAEL: 2059.2 [oz_av]

## 2017-11-21 NOTE — Discharge Summary (Signed)
[facility] [place] NAME: Jeffrey Prince    MR#:  [id number]  DATE OF BIRTH:  26-Apr-1966  DATE OF ADMISSION:  11/19/2017 ADMITTING PHYSICIAN: Bettey Costa, MD  DATE OF DISCHARGE: 11/20/2017  8:17 PM  PRIMARY CARE PHYSICIAN: Perrin Maltese, MD    ADMISSION DIAGNOSIS:  Chest pain, unspecified type [R07.9]  DISCHARGE DIAGNOSIS:  Active Problems:   NSTEMI (non-ST elevated myocardial infarction) ([facility])   SECONDARY DIAGNOSIS:   Past Medical History:  Diagnosis Date  . Acne   . Blind left eye    Since birth  . Chorea   . Diverticulosis   . ED (erectile dysfunction)   . GERD (gastroesophageal reflux disease)   . Hemorrhoids   . Weight loss    Abnormal    HOSPITAL COURSE:   52 year old male with past medical history of coronary artery disease status post recent bypass, hypertension, hyperlipidemia, history of diverticulosis GERD, hemorrhoids who presented to the hospital with atypical chest pain.  1. Chest pain-patient does have significant cardiac risk factors given his history of coronary disease and bypass. Patient just had his bypass surgery 2 weeks ago and presented to the hospital with chest pain which was very atypical for angina. -Patient was observed overnight underwent 3 sets of cardiac markers which were slightly elevated but did not trend upwards. Patient was seen by cardiology by Dr. Humphrey Rolls and underwent a cardiac catheterization which showed patent bypass grafts with no significant acute coronary disease that needed intervention. -She was discharged home with follow-up with cardiology as an outpatient. He was started on a small dose of Imdur.  - he will aspirin, atorvastatin, metoprolol.  2. Essential hypertension-patient will continue his metoprolol.  3. Hyperlipidemia-patient will continue his atorvastatin.  4. GERD-patient will continue his zantac.   DISCHARGE CONDITIONS:   Stable.   CONSULTS OBTAINED:  Treatment  Team:  Dionisio David, MD  DRUG ALLERGIES:   Allergies  Allergen Reactions  . Bee Venom Swelling    SWELLING REACTION UNSPECIFIED  SEVERITY RATED HIGH FROM PMH  . Codeine Diarrhea, Nausea Only and Rash  . Sulfa Antibiotics Nausea And Vomiting    INCLUDES SPECIFICALLY SULFASALAZINE    DISCHARGE MEDICATIONS:   Allergies as of 11/20/2017      Reactions   Bee Venom Swelling   SWELLING REACTION UNSPECIFIED  SEVERITY RATED HIGH FROM PMH   Codeine Diarrhea, Nausea Only, Rash   Sulfa Antibiotics Nausea And Vomiting   INCLUDES SPECIFICALLY SULFASALAZINE      Medication List    STOP taking these medications   acetaminophen 325 MG tablet Commonly known as:  TYLENOL     TAKE these medications   aspirin 325 MG EC tablet Take 1 tablet (325 mg total) by mouth daily.   atorvastatin 40 MG tablet Commonly known as:  LIPITOR Take 1 tablet (40 mg total) by mouth daily.   furosemide 40 MG tablet Commonly known as:  LASIX Take 1 tablet (40 mg total) by mouth daily for 5 days.   hyoscyamine 0.125 MG Tbdp disintergrating tablet Commonly known as:  ANASPAZ Place 0.125 mg under the tongue every 4 (four) hours as needed for cramping.   isosorbide mononitrate 30 MG 24 hr tablet Commonly known as:  IMDUR Take 1 tablet (30 mg total) by mouth daily.   metoprolol tartrate 25 MG tablet Commonly known as:  LOPRESSOR Take 0.5 tablets (12.5 mg total) by mouth 2 (two) times daily.   potassium chloride SA 20  MEQ tablet Commonly known as:  K-DUR,KLOR-CON Take 1 tablet (20 mEq total) by mouth daily for 5 days.   ranitidine 150 MG tablet Commonly known as:  ZANTAC Take 1 tablet (150 mg total) by mouth 2 (two) times daily.   simethicone 80 MG chewable tablet Commonly known as:  MYLICON Chew 1 tablet (80 mg total) by mouth 4 (four) times daily as needed for flatulence.   traMADol 50 MG tablet Commonly known as:  ULTRAM Take 1 tablet (50 mg total) by mouth every 6 (six) hours as needed  for moderate pain.         DISCHARGE INSTRUCTIONS:   DIET:  Cardiac diet  DISCHARGE CONDITION:  Stable  ACTIVITY:  Activity as tolerated  OXYGEN:  Home Oxygen: No.   Oxygen Delivery: room air  DISCHARGE LOCATION:  home   If you experience worsening of your admission symptoms, develop shortness of breath, life threatening emergency, suicidal or homicidal thoughts you must seek medical attention immediately by calling 911 or calling your MD immediately  if symptoms less severe.  You Must read complete instructions/literature along with all the possible adverse reactions/side effects for all the Medicines you take and that have been prescribed to you. Take any new Medicines after you have completely understood and accpet all the possible adverse reactions/side effects.   Please note  You were cared for by a hospitalist during your hospital stay. If you have any questions about your discharge medications or the care you received while you were in the hospital after you are discharged, you can call the unit and asked to speak with the hospitalist on call if the hospitalist that took care of you is not available. Once you are discharged, your primary care physician will handle any further medical issues. Please note that NO REFILLS for any discharge medications will be authorized once you are discharged, as it is imperative that you return to your primary care physician (or establish a relationship with a primary care physician if you do not have one) for your aftercare needs so that they can reassess your need for medications and monitor your lab values.     Today   Still having some vague CP which is reproducible.  S/p Cardiac cath showing patents grafts and no acute CAD that needs intervention. Will d/c home later today.   VITAL SIGNS:  Blood pressure 111/65, pulse 78, temperature 98.5 F (36.9 C), temperature source Oral, resp. rate 19, height 5\' 2"  (1.575 m), weight 58.4 kg  (128 lb 11.2 oz), SpO2 100 %.  I/O:  No intake or output data in the 24 hours ending 11/21/17 1555  PHYSICAL EXAMINATION:  GENERAL:  52 y.o.-year-old patient lying in the bed with no acute distress.  EYES: Pupils equal, round, reactive to light and accommodation. No scleral icterus. Extraocular muscles intact.  HEENT: Head atraumatic, normocephalic. Oropharynx and nasopharynx clear.  NECK:  Supple, no jugular venous distention. No thyroid enlargement, no tenderness.  LUNGS: Normal breath sounds bilaterally, no wheezing, rales,rhonchi. No use of accessory muscles of respiration.  CARDIOVASCULAR: S1, S2 normal. No murmurs, rubs, or gallops.  ABDOMEN: Soft, non-tender, non-distended. Bowel sounds present. No organomegaly or mass.  EXTREMITIES: No pedal edema, cyanosis, or clubbing.  NEUROLOGIC: Cranial nerves II through XII are intact. No focal motor or sensory defecits b/l.  PSYCHIATRIC: The patient is alert and oriented x 3.  SKIN: No obvious rash, lesion, or ulcer.   DATA REVIEW:   CBC Recent Labs  Lab 11/20/17  1801  WBC 3.5*  HGB 11.0*  HCT 32.6*  PLT 440    Chemistries  Recent Labs  Lab 11/19/17 1435  11/20/17 1801  NA 139   < > 139  K 3.6   < > 3.4*  CL 105   < > 106  CO2 26   < > 23  GLUCOSE 99   < > 66  BUN 9   < > 8  CREATININE 0.69   < > 0.71  CALCIUM 9.0   < > 8.7*  AST 23  --   --   ALT 52  --   --   ALKPHOS 91  --   --   BILITOT 0.7  --   --    < > = values in this interval not displayed.    Cardiac Enzymes Recent Labs  Lab 11/20/17 0531  TROPONINI 0.03*    Microbiology Results  Results for orders placed or performed during the hospital encounter of 10/31/17  MRSA PCR Screening     Status: None   Collection Time: 11/03/17 12:38 AM  Result Value Ref Range Status   MRSA by PCR NEGATIVE NEGATIVE Final    Comment:        The GeneXpert MRSA Assay (FDA approved for NASAL specimens only), is one component of a comprehensive MRSA  colonization surveillance program. It is not intended to diagnose MRSA infection nor to guide or monitor treatment for MRSA infections.     RADIOLOGY:  No results found.    Management plans discussed with the patient, family and they are in agreement.  CODE STATUS:  Code Status History    Date Active Date Inactive Code Status Order ID Comments User Context   11/19/2017 17:19 11/20/2017 23:22 Full Code [id number]  Bettey Costa, MD Inpatient   TOTAL TIME TAKING CARE OF THIS PATIENT: 40 minutes.    Henreitta Leber M.D on 11/21/2017 at 3:55 PM  Between 7am to 6pm - Pager - [phone]  After 6pm go to [email or web address] - Proofreader  [facility]  Office  [phone]  CC: Primary care physician; Perrin Maltese, MD

## 2017-12-10 ENCOUNTER — Other Ambulatory Visit: Payer: Self-pay | Admitting: Cardiothoracic Surgery

## 2017-12-10 DIAGNOSIS — Z951 Presence of aortocoronary bypass graft: Secondary | ICD-10-CM

## 2017-12-11 ENCOUNTER — Ambulatory Visit: Payer: BLUE CROSS/BLUE SHIELD | Admitting: Cardiothoracic Surgery

## 2017-12-15 ENCOUNTER — Ambulatory Visit (INDEPENDENT_AMBULATORY_CARE_PROVIDER_SITE_OTHER): Payer: Self-pay | Admitting: Physician Assistant

## 2017-12-15 ENCOUNTER — Other Ambulatory Visit: Payer: Self-pay

## 2017-12-15 ENCOUNTER — Ambulatory Visit
Admission: RE | Admit: 2017-12-15 | Discharge: 2017-12-15 | Disposition: A | Discharge status: Home / Self Care | Payer: BLUE CROSS/BLUE SHIELD | Source: Ambulatory Visit | Attending: Cardiothoracic Surgery | Admitting: Cardiothoracic Surgery

## 2017-12-15 VITALS — BP 112/67 | HR 50 | Resp 18 | Ht 70.0 in | Wt 139.2 lb

## 2017-12-15 DIAGNOSIS — Z951 Presence of aortocoronary bypass graft: Secondary | ICD-10-CM

## 2017-12-15 DIAGNOSIS — I2510 Atherosclerotic heart disease of native coronary artery without angina pectoris: Secondary | ICD-10-CM

## 2017-12-15 NOTE — Patient Instructions (Signed)
Please follow-up with your cardiologist Dr. Humphrey Rolls Please follow-up with your primary care provider on December 17, 2017.  You are encouraged to enroll and participate in the outpatient cardiac rehab program beginning as soon as practical.  You may return to driving an automobile as long as you are no longer requiring oral narcotic pain relievers during the daytime.  It would be wise to start driving only short distances during the daylight and gradually increase from there as you feel comfortable.  Make every effort to stay physically active, get some type of exercise on a regular basis, and stick to a "heart healthy diet".  The long term benefits for regular exercise and a healthy diet are critically important to your overall health and wellbeing.

## 2017-12-15 NOTE — Progress Notes (Signed)
[address]       [place] [address]             [phone]      Jeffrey Prince is a 52 y.o. male patient status post coronary bypass grafting x4.  He is here for his routine follow-up visit.    1. S/P CABG x 4   2. Coronary artery disease involving native heart, angina presence unspecified, unspecified vessel or lesion type    Past Medical History:  Diagnosis Date  . Acne   . Blind left eye    Since birth  . Chorea   . Diverticulosis   . ED (erectile dysfunction)   . GERD (gastroesophageal reflux disease)   . Hemorrhoids   . Weight loss    Abnormal   No past surgical history pertinent negatives on file. Scheduled Meds: Current Outpatient Medications on File Prior to Visit  Medication Sig Dispense Refill  . aspirin EC 325 MG EC tablet Take 1 tablet (325 mg total) by mouth daily. 30 tablet 0  . atorvastatin (LIPITOR) 40 MG tablet Take 1 tablet (40 mg total) by mouth daily. 30 tablet 0  . hyoscyamine (ANASPAZ) 0.125 MG TBDP disintergrating tablet Place 0.125 mg under the tongue every 4 (four) hours as needed for cramping.     . isosorbide mononitrate (IMDUR) 30 MG 24 hr tablet Take 1 tablet (30 mg total) by mouth daily. 30 tablet 1  . metoprolol tartrate (LOPRESSOR) 25 MG tablet Take 0.5 tablets (12.5 mg total) by mouth 2 (two) times daily. 30 tablet 1  . ranitidine (ZANTAC) 150 MG tablet Take 1 tablet (150 mg total) by mouth 2 (two) times daily. 30 tablet 5  . simethicone (MYLICON) 80 MG chewable tablet Chew 1 tablet (80 mg total) by mouth 4 (four) times daily as needed for flatulence. 30 tablet 0  . traMADol (ULTRAM) 50 MG tablet Take 1 tablet (50 mg total) by mouth every 6 (six) hours as needed for moderate pain. 30 tablet 0  . furosemide (LASIX) 40 MG tablet Take 1 tablet (40 mg total) by mouth daily for 5 days. (Patient not taking: Reported on 11/19/2017) 5 tablet 0  . potassium chloride SA (K-DUR,KLOR-CON) 20 MEQ tablet Take 1 tablet (20 mEq  total) by mouth daily for 5 days. 5 tablet 0   No current facility-administered medications on file prior to visit.     Allergies  Allergen Reactions  . Bee Venom Swelling    SWELLING REACTION UNSPECIFIED  SEVERITY RATED HIGH FROM PMH  . Codeine Diarrhea, Nausea Only and Rash  . Sulfa Antibiotics Nausea And Vomiting    INCLUDES SPECIFICALLY SULFASALAZINE    Blood pressure 112/67, pulse (!) 50, resp. rate 18, height 5\' 10"  (1.778 m), weight 139 lb 3.2 oz (63.1 kg), SpO2 98 %.  Subjective: Jeffrey Prince presents today for his routine 4-week follow-up appointment status post coronary bypass grafting x3.  Overall he has been doing quite well.   Objective: Cor: Sinus bradycardia, no murmur Pulm: CTA bilaterally and in all fields Abd: no tenderness Wound: c/d/i  Ext:no edema  Assessment & Plan  He has been walking to his mailbox and back however has not gone further than that.  He does live by himself so he has been independent.  Occasionally he does have incisional pain but he takes tramadol and this relieves it.  Today we discussed cardiac rehab which she is still deciding whether he wants to do  in [place] or in [place].  There was a representative that reached out to him yesterday about this and he plans to return the call today.  He is no longer taking his Lasix or potassium and appears euvolemic on exam today.  He remains on a low-dose beta-blocker and is slightly bradycardic today with a heart rate of 50.  His blood pressure is well controlled.  He asked about taking his Cialis which I stated would be okay as long as his systolic blood pressure is greater than 110.  He does have a way to take his blood pressure at home.  He is cleared to drive as long as he is not requiring tramadol during the day.  We reviewed his chest x-ray from today together which showed no pneumothorax, no pleural effusions, and limited atelectasis.  He has not followed up with cardiology yet.  He does follow Dr.  Humphrey Rolls in [place].  I have asked our office to get in touch with them to and arrange an appointment.  He has an appointment with his primary care provider on February 13.  He is to follow-up with our office as needed.  The patient had no further questions at this time.  He can call our office with questions or concerns as needed.   Elgie Collard 12/15/2017

## 2017-12-25 ENCOUNTER — Ambulatory Visit: Payer: BLUE CROSS/BLUE SHIELD

## 2017-12-26 ENCOUNTER — Other Ambulatory Visit: Payer: Self-pay

## 2017-12-26 ENCOUNTER — Emergency Department: Payer: BLUE CROSS/BLUE SHIELD

## 2017-12-26 ENCOUNTER — Emergency Department
Admission: EM | Admit: 2017-12-26 | Discharge: 2017-12-26 | Disposition: A | Discharge status: Home / Self Care | Payer: BLUE CROSS/BLUE SHIELD | Attending: Emergency Medicine | Admitting: Emergency Medicine

## 2017-12-26 DIAGNOSIS — Z79899 Other long term (current) drug therapy: Secondary | ICD-10-CM | POA: Insufficient documentation

## 2017-12-26 DIAGNOSIS — R0789 Other chest pain: Secondary | ICD-10-CM | POA: Diagnosis present

## 2017-12-26 DIAGNOSIS — I2510 Atherosclerotic heart disease of native coronary artery without angina pectoris: Secondary | ICD-10-CM | POA: Diagnosis not present

## 2017-12-26 DIAGNOSIS — Z951 Presence of aortocoronary bypass graft: Secondary | ICD-10-CM | POA: Diagnosis not present

## 2017-12-26 DIAGNOSIS — Z87891 Personal history of nicotine dependence: Secondary | ICD-10-CM | POA: Insufficient documentation

## 2017-12-26 DIAGNOSIS — G8918 Other acute postprocedural pain: Secondary | ICD-10-CM | POA: Insufficient documentation

## 2017-12-26 DIAGNOSIS — Z7982 Long term (current) use of aspirin: Secondary | ICD-10-CM | POA: Diagnosis not present

## 2017-12-26 LAB — TROPONIN I: Troponin I: <0.03 ng/mL (ref <0.03)

## 2017-12-26 LAB — BASIC METABOLIC PANEL
Anion gap: 8 (ref 5–15)
BUN: 9 mg/dL (ref 6–20)
CHLORIDE: 109 mmol/L (ref 101–111)
CO2: 24 mmol/L (ref 22–32)
CREATININE: 0.66 mg/dL (ref 0.61–1.24)
Calcium: 9 mg/dL (ref 8.9–10.3)
GFR calc non Af Amer: >60 mL/min (ref >60)
Glucose, Bld: 93 mg/dL (ref 65–99)
POTASSIUM: 3.5 mmol/L (ref 3.5–5.1)
Sodium: 141 mmol/L (ref 135–145)

## 2017-12-26 LAB — CBC
HEMATOCRIT: 41.5 % (ref 40.0–52.0)
Hemoglobin: 13.5 g/dL (ref 13.0–18.0)
MCH: 27.6 pg (ref 26.0–34.0)
MCHC: 32.5 g/dL (ref 32.0–36.0)
MCV: 85.2 fL (ref 80.0–100.0)
PLATELETS: 196 10*3/uL (ref 150–440)
RBC: 4.87 MIL/uL (ref 4.40–5.90)
RDW: 15.7 % — ABNORMAL HIGH (ref 11.5–14.5)
WBC: 4 10*3/uL (ref 3.8–10.6)

## 2017-12-26 MED ORDER — KETOROLAC TROMETHAMINE 30 MG/ML IJ SOLN
30.0000 mg | Freq: Once | INTRAMUSCULAR | Status: AC
  Administered 2017-12-26: 30 mg via INTRAMUSCULAR
  Filled 2017-12-26: qty 1

## 2017-12-26 MED ORDER — NAPROXEN 500 MG PO TABS: 500.0000 mg | ORAL_TABLET | Freq: Two times a day (BID) | ORAL | 2 refills | Status: DC

## 2017-12-26 NOTE — ED Provider Notes (Signed)
[facility] Emergency Department Provider Note   ____________________________________________    I have reviewed the triage vital signs and the nursing notes.   HISTORY  Chief Complaint Chest pain    HPI Jeffrey Prince is a 52 y.o. male who presents with complaints of chest pain.  Patient had a CABG approximately 1 month ago, since that time he has had intermittent chest discomfort essentially daily.  He was seen in the hospital 2 weeks ago at that time had a catheterization which showed patent vessels by his cardiologist Dr. Yancey Flemings.  Today he felt that his chest pain was more moderate than usual this morning and he describes it as a aching sensation in his sternum.  No fevers or chills.  No cough no shortness of breath.  Not related to exertion.  Took some Tylenol without improvement  Past Medical History:  Diagnosis Date  . Acne   . Blind left eye    Since birth  . Chorea   . Diverticulosis   . ED (erectile dysfunction)   . GERD (gastroesophageal reflux disease)   . Hemorrhoids   . Weight loss    Abnormal    Patient Active Problem List   Diagnosis Date Noted  . NSTEMI (non-ST elevated myocardial infarction) ([place]) 11/19/2017  . Coronary artery disease 11/03/2017  . ACS (acute coronary syndrome) ([place]) 10/31/2017  . Chest pain 10/30/2017  . Anal fissure   . Family history of Huntington's chorea 07/01/2017  . Unintentional weight loss 05/16/2015  . Diarrhea 05/16/2015  . GERD (gastroesophageal reflux disease) 05/16/2015  . Diffuse abdominal pain 05/16/2015  . Dyslipidemia (high LDL; low HDL) 01/25/2015  . Decreased visual acuity 01/24/2015  . Erectile dysfunction 01/24/2015  . Current every day smoker 01/24/2015  . 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 Hemorrhoids 03/16/2014    Past Surgical History:  Procedure Laterality Date  . BOTOX INJECTION N/A 10/09/2016   Procedure: BOTOX INJECTION;  Surgeon: Jules Husbands, MD;   Location: [facility];  Service: General;  Laterality: N/A;  . BOTOX INJECTION N/A 07/23/2017   Procedure: BOTOX INJECTION;  Surgeon: Jules Husbands, MD;  Location: [facility];  Service: General;  Laterality: N/A;  . COLONOSCOPY     with polyp removal  . CORONARY ARTERY BYPASS GRAFT N/A 11/03/2017   Procedure: CORONARY ARTERY BYPASS GRAFTING (CABG) x four, using left internal mammry artery and right leg greater saphenous vein harvested endoscopically (LIMA to LAD, SVG to OM1, SVG to RAMUS INTERMEDIATE, SVG to PDA)  ;  Surgeon: Grace Isaac, MD;  Location: [facility];  Service: Open Heart Surgery;  Laterality: N/A;  . EVALUATION UNDER ANESTHESIA WITH ANAL FISSUROTOMY N/A 10/09/2016   Procedure: EXAM UNDER ANESTHESIA WITH ANAL FISSUROTOMY chemical sphincterotomy w BOtox.;  Surgeon: Jules Husbands, MD;  Location: [facility];  Service: General;  Laterality: N/A;  Lithotomy, make sure botox is in the room  . EVALUATION UNDER ANESTHESIA WITH ANAL FISSUROTOMY N/A 07/23/2017   Procedure: EXAM UNDER ANESTHESIA WITH ANAL FISSUROTOMY, botox injection;  Surgeon: Jules Husbands, MD;  Location: [facility];  Service: General;  Laterality: N/A;  . HEMORRHOID SURGERY    . LEFT HEART CATH AND CORONARY ANGIOGRAPHY Right 10/30/2017   Procedure: LEFT HEART CATH AND CORONARY ANGIOGRAPHY;  Surgeon: Dionisio David, MD;  Location: [facility];  Service: Cardiovascular;  Laterality: Right;  . LEFT HEART CATH AND CORONARY ANGIOGRAPHY Right 11/20/2017   Procedure: LEFT HEART CATH AND CORONARY ANGIOGRAPHY;  Surgeon: Dionisio David, MD;  Location: [facility];  Service: Cardiovascular;  Laterality: Right;  . ROTATOR CUFF REPAIR  1991  . TEE WITHOUT CARDIOVERSION N/A 11/03/2017   Procedure: TRANSESOPHAGEAL ECHOCARDIOGRAM (TEE);  Surgeon: Grace Isaac, MD;  Location: [facility];  Service: Open Heart Surgery;  Laterality: N/A;    Prior to Admission medications   Medication Sig Start Date End Date Taking? Authorizing  Provider  aspirin EC 325 MG EC tablet Take 1 tablet (325 mg total) by mouth daily. 11/08/17   Elgie Collard, PA-C  atorvastatin (LIPITOR) 40 MG tablet Take 1 tablet (40 mg total) by mouth daily. 11/08/17   Elgie Collard, PA-C  furosemide (LASIX) 40 MG tablet Take 1 tablet (40 mg total) by mouth daily for 5 days. Patient not taking: Reported on 11/19/2017 11/08/17 11/13/17  Elgie Collard, PA-C  hyoscyamine (ANASPAZ) 0.125 MG TBDP disintergrating tablet Place 0.125 mg under the tongue every 4 (four) hours as needed for cramping.     [provider]  isosorbide mononitrate (IMDUR) 30 MG 24 hr tablet Take 1 tablet (30 mg total) by mouth daily. 11/20/17   Henreitta Leber, MD  metoprolol tartrate (LOPRESSOR) 25 MG tablet Take 0.5 tablets (12.5 mg total) by mouth 2 (two) times daily. 11/08/17   Elgie Collard, PA-C  naproxen (NAPROSYN) 500 MG tablet Take 1 tablet (500 mg total) by mouth 2 (two) times daily with a meal. 12/26/17   Lavonia Drafts, MD  potassium chloride SA (K-DUR,KLOR-CON) 20 MEQ tablet Take 1 tablet (20 mEq total) by mouth daily for 5 days. 11/08/17 11/19/17  Elgie Collard, PA-C  ranitidine (ZANTAC) 150 MG tablet Take 1 tablet (150 mg total) by mouth 2 (two) times daily. 12/14/15   Funches, Adriana Mccallum, MD  simethicone (MYLICON) 80 MG chewable tablet Chew 1 tablet (80 mg total) by mouth 4 (four) times daily as needed for flatulence. 11/08/17   Elgie Collard, PA-C  traMADol (ULTRAM) 50 MG tablet Take 1 tablet (50 mg total) by mouth every 6 (six) hours as needed for moderate pain. 11/08/17   Elgie Collard, PA-C     Allergies Bee venom; Codeine; and Sulfa antibiotics  Family History  Problem Relation Age of Onset  . Breast cancer Mother   . Huntington's disease Father   . Cancer Paternal Uncle        Colon  . Diabetes Son   . Cancer Maternal Grandfather   . Cancer Paternal Grandmother   . Heart disease Paternal Grandmother   . Huntington's disease Paternal Grandmother     Social  History Social History   Tobacco Use  . Smoking status: Former Smoker    Packs/day: 0.50    Years: 20.00    Pack years: 10.00    Types: Cigarettes    Last attempt to quit: 06/16/2017    Years since quitting: 0.5  . Smokeless tobacco: Never Used  Substance Use Topics  . Alcohol use: No    Alcohol/week: 0.0 oz  . Drug use: No    Review of Systems  Constitutional: No fever/chills Eyes: No visual changes.  ENT: No sore throat. Cardiovascular: Denies chest pain. Respiratory: Denies shortness of breath. Gastrointestinal: No abdominal pain.  No nausea, no vomiting.   Genitourinary: Negative for dysuria. Musculoskeletal: Negative for back pain. Skin: Negative for rash. Neurological: Negative for headaches or weakness   ____________________________________________   PHYSICAL EXAM:  VITAL SIGNS: ED Triage Vitals  Enc Vitals Group  BP 12/26/17 1817 121/76     Pulse Rate 12/26/17 1817 62     Resp 12/26/17 1817 16     Temp 12/26/17 1817 98.6 F (37 C)     Temp Source 12/26/17 1817 Oral     SpO2 12/26/17 1817 97 %     Weight 12/26/17 1818 63 kg (139 lb)     Height 12/26/17 1818 1.778 m (5\' 10" )     Head Circumference --      Peak Flow --      Pain Score 12/26/17 1817 6     Pain Loc --      Pain Edu? --      Excl. in [place]? --     Constitutional: Alert and oriented. No acute distress. Pleasant and interactive Eyes: Conjunctivae are normal.   Nose: No congestion/rhinnorhea. Mouth/Throat: Mucous membranes are moist.    Cardiovascular: Normal rate, regular rhythm. Grossly normal heart sounds.  Good peripheral circulation.  Tenderness to palpation to the sternum, primarily the left border, mimics his pain Respiratory: Normal respiratory effort.  No retractions. Lungs CTAB. Gastrointestinal: Soft and nontender. No distention.   Musculoskeletal: No lower extremity tenderness nor edema.  Warm and well perfused Neurologic:  Normal speech and language. No gross focal  neurologic deficits are appreciated.  Skin:  Skin is warm, dry and intact. No rash noted. Psychiatric: Mood and affect are normal. Speech and behavior are normal.  ____________________________________________   LABS (all labs ordered are listed, but only abnormal results are displayed)  Labs Reviewed  CBC - Abnormal; Notable for the following components:      Result Value   RDW 15.7 (*)    All other components within normal limits  BASIC METABOLIC PANEL  TROPONIN I   ____________________________________________  EKG  ED ECG REPORT I, Lavonia Drafts, the attending physician, personally viewed and interpreted this ECG.  Date: 12/26/2017  Rhythm: normal sinus rhythm QRS Axis: normal Intervals: normal ST/T Wave abnormalities: normal Narrative Interpretation: No significant change  ____________________________________________  RADIOLOGY  Chest x-ray normal ____________________________________________   PROCEDURES  Procedure(s) performed: No  Procedures   Critical Care performed: No ____________________________________________   INITIAL IMPRESSION / ASSESSMENT AND PLAN / ED COURSE  Pertinent labs & imaging results that were available during my care of the patient were reviewed by me and considered in my medical decision making (see chart for details).  Patient well-appearing in no acute distress.  EKG is unchanged.  Lab work is unremarkable, troponin is normal.  Chest x-ray is unremarkable.  Exam is significant for tenderness over the sternum, I suspect his pain is related to his sternum secondary tohis recent CABG.  Discussed this with his cardiologist Dr. Humphrey Rolls who agrees and does not feel the patient needs admission and recommend starting the patient on naproxen.  He will see him in the his office in 2 days.  Discussed this with the patient who agrees with the plan.    ____________________________________________   FINAL CLINICAL IMPRESSION(S) / ED  DIAGNOSES  Final diagnoses:  Chest wall pain following surgery        Note:  This document was prepared using Dragon voice recognition software and may include unintentional dictation errors.    Lavonia Drafts, MD 12/26/17 [phone]

## 2017-12-26 NOTE — ED Notes (Signed)
ED Provider at bedside. 

## 2017-12-26 NOTE — ED Triage Notes (Signed)
First Nurse Note:  Arrives with C/O chest pain-- intermittently all day.  Patient states he had CABG x 4 on e month ago.  AAOx3.  Skin warm and dry.;  No SOB/ DOE.  NAD

## 2017-12-26 NOTE — ED Triage Notes (Signed)
Pt c/o left sided chest pain since having CABG 12/31. Pt states he had the surgery on 11/11/17, records show 12/31. Asked if the pt has discussed the pain with the surgeon, he states he has and they have checked and everything checked out ok.Marland Kitchen

## 2018-03-16 ENCOUNTER — Emergency Department
Admission: EM | Admit: 2018-03-16 | Discharge: 2018-03-16 | Disposition: A | Discharge status: Home / Self Care | Payer: BLUE CROSS/BLUE SHIELD | Attending: Emergency Medicine | Admitting: Emergency Medicine

## 2018-03-16 ENCOUNTER — Other Ambulatory Visit: Payer: Self-pay

## 2018-03-16 ENCOUNTER — Encounter: Payer: Self-pay | Admitting: Emergency Medicine

## 2018-03-16 DIAGNOSIS — R197 Diarrhea, unspecified: Secondary | ICD-10-CM

## 2018-03-16 DIAGNOSIS — R109 Unspecified abdominal pain: Secondary | ICD-10-CM | POA: Diagnosis present

## 2018-03-16 DIAGNOSIS — R1084 Generalized abdominal pain: Secondary | ICD-10-CM | POA: Insufficient documentation

## 2018-03-16 DIAGNOSIS — Z87891 Personal history of nicotine dependence: Secondary | ICD-10-CM | POA: Insufficient documentation

## 2018-03-16 DIAGNOSIS — Z79899 Other long term (current) drug therapy: Secondary | ICD-10-CM | POA: Insufficient documentation

## 2018-03-16 DIAGNOSIS — Z7982 Long term (current) use of aspirin: Secondary | ICD-10-CM | POA: Diagnosis not present

## 2018-03-16 DIAGNOSIS — I259 Chronic ischemic heart disease, unspecified: Secondary | ICD-10-CM | POA: Diagnosis not present

## 2018-03-16 LAB — URINALYSIS, COMPLETE (UACMP) WITH MICROSCOPIC
Bacteria, UA: NONE SEEN
Bilirubin Urine: NEGATIVE
Glucose, UA: NEGATIVE mg/dL
HGB URINE DIPSTICK: NEGATIVE
Ketones, ur: NEGATIVE mg/dL
LEUKOCYTES UA: NEGATIVE
Nitrite: NEGATIVE
PROTEIN: NEGATIVE mg/dL
SQUAMOUS EPITHELIAL / LPF: NONE SEEN (ref 0–5)
Specific Gravity, Urine: 1 — ABNORMAL LOW (ref 1.005–1.030)
WBC UA: NONE SEEN WBC/hpf (ref 0–5)
pH: 7 (ref 5.0–8.0)

## 2018-03-16 LAB — COMPREHENSIVE METABOLIC PANEL
ALBUMIN: 4.1 g/dL (ref 3.5–5.0)
ALT: 18 U/L (ref 17–63)
ANION GAP: 6 (ref 5–15)
AST: 25 U/L (ref 15–41)
Alkaline Phosphatase: 68 U/L (ref 38–126)
BUN: 5 mg/dL — ABNORMAL LOW (ref 6–20)
CHLORIDE: 92 mmol/L — AB (ref 101–111)
CO2: 32 mmol/L (ref 22–32)
Calcium: 9.4 mg/dL (ref 8.9–10.3)
Creatinine, Ser: 0.69 mg/dL (ref 0.61–1.24)
Glucose, Bld: 108 mg/dL — ABNORMAL HIGH (ref 65–99)
POTASSIUM: 3 mmol/L — AB (ref 3.5–5.1)
SODIUM: 130 mmol/L — AB (ref 135–145)
Total Bilirubin: 1 mg/dL (ref 0.3–1.2)
Total Protein: 8 g/dL (ref 6.5–8.1)

## 2018-03-16 LAB — CBC
HEMATOCRIT: 42 % (ref 40.0–52.0)
HEMOGLOBIN: 14.5 g/dL (ref 13.0–18.0)
MCH: 27.9 pg (ref 26.0–34.0)
MCHC: 34.4 g/dL (ref 32.0–36.0)
MCV: 80.9 fL (ref 80.0–100.0)
Platelets: 274 10*3/uL (ref 150–440)
RBC: 5.19 MIL/uL (ref 4.40–5.90)
RDW: 16.3 % — ABNORMAL HIGH (ref 11.5–14.5)
WBC: 4 10*3/uL (ref 3.8–10.6)

## 2018-03-16 LAB — LIPASE, BLOOD: LIPASE: 23 U/L (ref 11–51)

## 2018-03-16 MED ORDER — POTASSIUM CHLORIDE CRYS ER 20 MEQ PO TBCR
40.0000 meq | EXTENDED_RELEASE_TABLET | Freq: Once | ORAL | Status: AC
  Administered 2018-03-16: 40 meq via ORAL
  Filled 2018-03-16: qty 2

## 2018-03-16 MED ORDER — DIPHENOXYLATE-ATROPINE 2.5-0.025 MG PO TABS: 1.0000 | ORAL_TABLET | Freq: Four times a day (QID) | ORAL | 0 refills | Status: DC | PRN

## 2018-03-16 MED ORDER — DICYCLOMINE HCL 10 MG PO CAPS
10.0000 mg | ORAL_CAPSULE | Freq: Once | ORAL | Status: AC
Start: 2018-03-16 — End: 2018-03-16
  Administered 2018-03-16: 10 mg via ORAL
  Filled 2018-03-16: qty 1

## 2018-03-16 NOTE — ED Triage Notes (Signed)
Pt to ED via POV with c/o abd pain xfew days with diarrhea x1wk. PT denies any med use at home. PT ambulatory, VSS

## 2018-03-16 NOTE — ED Provider Notes (Signed)
[facility] Emergency Department Provider Note  ___________________________________________   First MD Initiated Contact with Patient 03/16/18 1855     (approximate)  I have reviewed the triage vital signs and the nursing notes.   HISTORY  Chief Complaint Abdominal Pain   HPI Jeffrey Prince is a 52 y.o. male with a history of diverticulosis as well as chronic abdominal pain, GI bleeding and diarrhea who is presenting to the emergency department today with diffuse abdominal cramping over the past 7 days associated with diarrhea.  He denies any blood in the stool.  Denies any nausea or vomiting.  Has not taken any medication for this.  Denies any recent antibiotics or hospitalizations.  Denies any known sick contacts.  Denies any radiation of the pain through to his back.  Is not reporting any chest pain or shortness of breath.  Says that he has about 2-3 episodes of diarrhea per day.   Past Medical History:  Diagnosis Date  . Acne   . Blind left eye    Since birth  . Chorea   . Diverticulosis   . ED (erectile dysfunction)   . GERD (gastroesophageal reflux disease)   . Hemorrhoids   . Weight loss    Abnormal    Patient Active Problem List   Diagnosis Date Noted  . NSTEMI (non-ST elevated myocardial infarction) ([place]) 11/19/2017  . Coronary artery disease 11/03/2017  . ACS (acute coronary syndrome) ([place]) 10/31/2017  . Chest pain 10/30/2017  . Anal fissure   . Family history of Huntington's chorea 07/01/2017  . Unintentional weight loss 05/16/2015  . Diarrhea 05/16/2015  . GERD (gastroesophageal reflux disease) 05/16/2015  . Diffuse abdominal pain 05/16/2015  . Dyslipidemia (high LDL; low HDL) 01/25/2015  . Decreased visual acuity 01/24/2015  . Erectile dysfunction 01/24/2015  . Current every day smoker 01/24/2015  . 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 Hemorrhoids 03/16/2014    Past Surgical History:  Procedure Laterality Date    . BOTOX INJECTION N/A 10/09/2016   Procedure: BOTOX INJECTION;  Surgeon: Jules Husbands, MD;  Location: [facility];  Service: General;  Laterality: N/A;  . BOTOX INJECTION N/A 07/23/2017   Procedure: BOTOX INJECTION;  Surgeon: Jules Husbands, MD;  Location: [facility];  Service: General;  Laterality: N/A;  . COLONOSCOPY     with polyp removal  . CORONARY ARTERY BYPASS GRAFT N/A 11/03/2017   Procedure: CORONARY ARTERY BYPASS GRAFTING (CABG) x four, using left internal mammry artery and right leg greater saphenous vein harvested endoscopically (LIMA to LAD, SVG to OM1, SVG to RAMUS INTERMEDIATE, SVG to PDA)  ;  Surgeon: Grace Isaac, MD;  Location: [place];  Service: Open Heart Surgery;  Laterality: N/A;  . EVALUATION UNDER ANESTHESIA WITH ANAL FISSUROTOMY N/A 10/09/2016   Procedure: EXAM UNDER ANESTHESIA WITH ANAL FISSUROTOMY chemical sphincterotomy w BOtox.;  Surgeon: Jules Husbands, MD;  Location: [facility];  Service: General;  Laterality: N/A;  Lithotomy, make sure botox is in the room  . EVALUATION UNDER ANESTHESIA WITH ANAL FISSUROTOMY N/A 07/23/2017   Procedure: EXAM UNDER ANESTHESIA WITH ANAL FISSUROTOMY, botox injection;  Surgeon: Jules Husbands, MD;  Location: [facility];  Service: General;  Laterality: N/A;  . HEMORRHOID SURGERY    . LEFT HEART CATH AND CORONARY ANGIOGRAPHY Right 10/30/2017   Procedure: LEFT HEART CATH AND CORONARY ANGIOGRAPHY;  Surgeon: Dionisio , MD;  Location: [place] [facility];  Service: Cardiovascular;  Laterality: Right;  . LEFT HEART  CATH AND CORONARY ANGIOGRAPHY Right 11/20/2017   Procedure: LEFT HEART CATH AND CORONARY ANGIOGRAPHY;  Surgeon: Dionisio , MD;  Location: [place] [facility];  Service: Cardiovascular;  Laterality: Right;  . ROTATOR CUFF REPAIR  1991  . TEE WITHOUT CARDIOVERSION N/A 11/03/2017   Procedure: TRANSESOPHAGEAL ECHOCARDIOGRAM (TEE);  Surgeon: Grace Isaac, MD;  Location: [place];  Service: Open Heart Surgery;  Laterality: N/A;     Prior to Admission medications   Medication Sig Start Date End Date Taking? Authorizing Provider  aspirin EC 325 MG EC tablet Take 1 tablet (325 mg total) by mouth daily. 11/08/17   Elgie Collard, PA-C  atorvastatin (LIPITOR) 40 MG tablet Take 1 tablet (40 mg total) by mouth daily. 11/08/17   Elgie Collard, PA-C  furosemide (LASIX) 40 MG tablet Take 1 tablet (40 mg total) by mouth daily for 5 days. Patient not taking: Reported on 11/19/2017 11/08/17 11/13/17  Elgie Collard, PA-C  hyoscyamine (ANASPAZ) 0.125 MG TBDP disintergrating tablet Place 0.125 mg under the tongue every 4 (four) hours as needed for cramping.     [provider]  isosorbide mononitrate (IMDUR) 30 MG 24 hr tablet Take 1 tablet (30 mg total) by mouth daily. 11/20/17   Henreitta Leber, MD  metoprolol tartrate (LOPRESSOR) 25 MG tablet Take 0.5 tablets (12.5 mg total) by mouth 2 (two) times daily. 11/08/17   Elgie Collard, PA-C  naproxen (NAPROSYN) 500 MG tablet Take 1 tablet (500 mg total) by mouth 2 (two) times daily with a meal. 12/26/17   Lavonia Drafts, MD  potassium chloride SA (K-DUR,KLOR-CON) 20 MEQ tablet Take 1 tablet (20 mEq total) by mouth daily for 5 days. 11/08/17 11/19/17  Elgie Collard, PA-C  ranitidine (ZANTAC) 150 MG tablet Take 1 tablet (150 mg total) by mouth 2 (two) times daily. 12/14/15   Funches, Adriana Mccallum, MD  simethicone (MYLICON) 80 MG chewable tablet Chew 1 tablet (80 mg total) by mouth 4 (four) times daily as needed for flatulence. 11/08/17   Elgie Collard, PA-C  traMADol (ULTRAM) 50 MG tablet Take 1 tablet (50 mg total) by mouth every 6 (six) hours as needed for moderate pain. 11/08/17   Elgie Collard, PA-C    Allergies Bee venom; Codeine; and Sulfa antibiotics  Family History  Problem Relation Age of Onset  . Breast cancer Mother   . Huntington's disease Father   . Cancer Paternal Uncle        Colon  . Diabetes Son   . Cancer Maternal Grandfather   . Cancer Paternal Grandmother   . Heart  disease Paternal Grandmother   . Huntington's disease Paternal Grandmother     Social History Social History   Tobacco Use  . Smoking status: Former Smoker    Packs/day: 0.50    Years: 20.00    Pack years: 10.00    Types: Cigarettes    Last attempt to quit: 06/16/2017    Years since quitting: 0.7  . Smokeless tobacco: Never Used  Substance Use Topics  . Alcohol use: No    Alcohol/week: 0.0 oz  . Drug use: No    Review of Systems  Constitutional: No fever/chills Eyes: No visual changes. ENT: No sore throat. Cardiovascular: Denies chest pain. Respiratory: Denies shortness of breath. Gastrointestinal: No nausea, no vomiting.  No constipation. Genitourinary: Negative for dysuria. Musculoskeletal: Negative for back pain. Skin: Negative for rash. Neurological: Negative for headaches, focal weakness or numbness.   ____________________________________________  PHYSICAL EXAM:  VITAL SIGNS: ED Triage Vitals [03/16/18 1657]  Enc Vitals Group     BP (!) 146/76     Pulse Rate (!) 54     Resp 16     Temp 98.4 F (36.9 C)     Temp Source Oral     SpO2 98 %     Weight 130 lb (59 kg)     Height 5\' 10"  (1.778 m)     Head Circumference      Peak Flow      Pain Score 7     Pain Loc      Pain Edu?      Excl. in [place]?     Constitutional: Alert and oriented. Well appearing and in no acute distress. Eyes: Conjunctivae are normal.  Head: Atraumatic. Nose: No congestion/rhinnorhea. Mouth/Throat: Mucous membranes are moist.  Neck: No stridor.   Cardiovascular: Normal rate, regular rhythm. Grossly normal heart sounds.   Respiratory: Normal respiratory effort.  No retractions. Lungs CTAB. Gastrointestinal: Soft and nontender. No distention. No CVA tenderness. Musculoskeletal: No lower extremity tenderness nor edema.  No joint effusions. Neurologic:  Normal speech and language. No gross focal neurologic deficits are appreciated. Skin:  Skin is warm, dry and intact. No rash  noted. Psychiatric: Mood and affect are normal. Speech and behavior are normal.  ____________________________________________   LABS (all labs ordered are listed, but only abnormal results are displayed)  Labs Reviewed  COMPREHENSIVE METABOLIC PANEL - Abnormal; Notable for the following components:      Result Value   Sodium 130 (*)    Potassium 3.0 (*)    Chloride 92 (*)    Glucose, Bld 108 (*)    BUN 5 (*)    All other components within normal limits  CBC - Abnormal; Notable for the following components:   RDW 16.3 (*)    All other components within normal limits  URINALYSIS, COMPLETE (UACMP) WITH MICROSCOPIC - Abnormal; Notable for the following components:   Color, Urine COLORLESS (*)    APPearance CLEAR (*)    Specific Gravity, Urine 1.000 (*)    All other components within normal limits  LIPASE, BLOOD   ____________________________________________  EKG   ____________________________________________  RADIOLOGY   ____________________________________________   PROCEDURES  Procedure(s) performed:   Procedures  Critical Care performed:   ____________________________________________   INITIAL IMPRESSION / ASSESSMENT AND PLAN / ED COURSE  Pertinent labs & imaging results that were available during my care of the patient were reviewed by me and considered in my medical decision making (see chart for details).  Differential diagnosis includes, but is not limited to, acute appendicitis, renal colic, testicular torsion, urinary tract infection/pyelonephritis, prostatitis,  epididymitis, diverticulitis, small bowel obstruction or ileus, colitis, abdominal aortic aneurysm, gastroenteritis, hernia, etc. As part of my medical decision making, I reviewed the following data within the electronic MEDICAL RECORD NUMBER Notes from prior ED visits as well as outpatient visits.  Chart review from appointment of October 22, 2017 Dr. Fulton Mole, with indication that the patient has  had chronic issues with abdominal pain and diarrhea.  Very reassuring exam as well as lab work.  We will give several dose of potassium for home use.  Will start patient on Lomotil.  Patient understanding of the treatment plan willing to comply but will be discharged.  Normal white blood cell count after 1 week of symptoms.  Seems to have low risk for infectious colitis.  Do not believe further work-up is needed  at this time.      ____________________________________________   FINAL CLINICAL IMPRESSION(S) / ED DIAGNOSES  Abdominal pain.  Diarrhea.    NEW MEDICATIONS STARTED DURING THIS VISIT:  New Prescriptions   No medications on file     Note:  This document was prepared using Dragon voice recognition software and may include unintentional dictation errors.     Orbie Pyo, MD 03/16/18 2126

## 2018-03-27 ENCOUNTER — Other Ambulatory Visit: Payer: Self-pay

## 2018-03-27 ENCOUNTER — Emergency Department
Admission: EM | Admit: 2018-03-27 | Discharge: 2018-03-27 | Disposition: A | Discharge status: Home / Self Care | Payer: BLUE CROSS/BLUE SHIELD | Attending: Emergency Medicine | Admitting: Emergency Medicine

## 2018-03-27 ENCOUNTER — Emergency Department: Payer: BLUE CROSS/BLUE SHIELD

## 2018-03-27 ENCOUNTER — Encounter: Payer: Self-pay | Admitting: Emergency Medicine

## 2018-03-27 DIAGNOSIS — Z79899 Other long term (current) drug therapy: Secondary | ICD-10-CM | POA: Diagnosis not present

## 2018-03-27 DIAGNOSIS — Z951 Presence of aortocoronary bypass graft: Secondary | ICD-10-CM | POA: Diagnosis not present

## 2018-03-27 DIAGNOSIS — I2510 Atherosclerotic heart disease of native coronary artery without angina pectoris: Secondary | ICD-10-CM | POA: Insufficient documentation

## 2018-03-27 DIAGNOSIS — R109 Unspecified abdominal pain: Secondary | ICD-10-CM

## 2018-03-27 DIAGNOSIS — R197 Diarrhea, unspecified: Secondary | ICD-10-CM | POA: Insufficient documentation

## 2018-03-27 DIAGNOSIS — Z87891 Personal history of nicotine dependence: Secondary | ICD-10-CM | POA: Insufficient documentation

## 2018-03-27 DIAGNOSIS — I252 Old myocardial infarction: Secondary | ICD-10-CM | POA: Diagnosis not present

## 2018-03-27 DIAGNOSIS — Z7982 Long term (current) use of aspirin: Secondary | ICD-10-CM | POA: Insufficient documentation

## 2018-03-27 DIAGNOSIS — R112 Nausea with vomiting, unspecified: Secondary | ICD-10-CM | POA: Insufficient documentation

## 2018-03-27 DIAGNOSIS — K529 Noninfective gastroenteritis and colitis, unspecified: Secondary | ICD-10-CM | POA: Diagnosis not present

## 2018-03-27 LAB — URINALYSIS, COMPLETE (UACMP) WITH MICROSCOPIC
Bacteria, UA: NONE SEEN
Bilirubin Urine: NEGATIVE
GLUCOSE, UA: NEGATIVE mg/dL
Ketones, ur: NEGATIVE mg/dL
LEUKOCYTES UA: NEGATIVE
NITRITE: NEGATIVE
PROTEIN: NEGATIVE mg/dL
Specific Gravity, Urine: 1.002 — ABNORMAL LOW (ref 1.005–1.030)
Squamous Epithelial / LPF: NONE SEEN (ref 0–5)
pH: 6 (ref 5.0–8.0)

## 2018-03-27 LAB — CBC
HCT: 39.8 % — ABNORMAL LOW (ref 40.0–52.0)
Hemoglobin: 13.6 g/dL (ref 13.0–18.0)
MCH: 28.1 pg (ref 26.0–34.0)
MCHC: 34.2 g/dL (ref 32.0–36.0)
MCV: 82.1 fL (ref 80.0–100.0)
PLATELETS: 236 10*3/uL (ref 150–440)
RBC: 4.84 MIL/uL (ref 4.40–5.90)
RDW: 15.6 % — ABNORMAL HIGH (ref 11.5–14.5)
WBC: 6.3 10*3/uL (ref 3.8–10.6)

## 2018-03-27 LAB — COMPREHENSIVE METABOLIC PANEL
ALK PHOS: 68 U/L (ref 38–126)
ALT: 19 U/L (ref 17–63)
AST: 22 U/L (ref 15–41)
Albumin: 4 g/dL (ref 3.5–5.0)
Anion gap: 7 (ref 5–15)
BUN: 7 mg/dL (ref 6–20)
CALCIUM: 9 mg/dL (ref 8.9–10.3)
CHLORIDE: 106 mmol/L (ref 101–111)
CO2: 25 mmol/L (ref 22–32)
CREATININE: 0.84 mg/dL (ref 0.61–1.24)
Glucose, Bld: 103 mg/dL — ABNORMAL HIGH (ref 65–99)
Potassium: 3.5 mmol/L (ref 3.5–5.1)
Sodium: 138 mmol/L (ref 135–145)
TOTAL PROTEIN: 7.6 g/dL (ref 6.5–8.1)
Total Bilirubin: 0.7 mg/dL (ref 0.3–1.2)

## 2018-03-27 LAB — LIPASE, BLOOD: LIPASE: 28 U/L (ref 11–51)

## 2018-03-27 MED ORDER — SODIUM CHLORIDE 0.9 % IV BOLUS
1000.0000 mL | Freq: Once | INTRAVENOUS | Status: AC
  Administered 2018-03-27: 1000 mL via INTRAVENOUS

## 2018-03-27 MED ORDER — GI COCKTAIL ~~LOC~~
30.0000 mL | Freq: Once | ORAL | Status: AC
  Administered 2018-03-27: 30 mL via ORAL
  Filled 2018-03-27: qty 30

## 2018-03-27 MED ORDER — IOPAMIDOL (ISOVUE-300) INJECTION 61%
100.0000 mL | Freq: Once | INTRAVENOUS | Status: AC | PRN
Start: 2018-03-27 — End: 2018-03-27
  Administered 2018-03-27: 100 mL via INTRAVENOUS

## 2018-03-27 MED ORDER — AMOXICILLIN-POT CLAVULANATE 875-125 MG PO TABS: 1.0000 | ORAL_TABLET | Freq: Two times a day (BID) | ORAL | 0 refills | Status: AC

## 2018-03-27 MED ORDER — ONDANSETRON HCL 4 MG/2ML IJ SOLN
4.0000 mg | Freq: Once | INTRAMUSCULAR | Status: AC
Start: 2018-03-27 — End: 2018-03-27
  Administered 2018-03-27: 4 mg via INTRAVENOUS
  Filled 2018-03-27: qty 2

## 2018-03-27 MED ORDER — ONDANSETRON HCL 4 MG/2ML IJ SOLN
INTRAMUSCULAR | Status: AC
  Filled 2018-03-27: qty 2

## 2018-03-27 MED ORDER — DICYCLOMINE HCL 10 MG PO CAPS
10.0000 mg | ORAL_CAPSULE | Freq: Once | ORAL | Status: AC
Start: 2018-03-27 — End: 2018-03-27
  Administered 2018-03-27: 10 mg via ORAL
  Filled 2018-03-27: qty 1

## 2018-03-27 MED ORDER — IOPAMIDOL (ISOVUE-300) INJECTION 61%
30.0000 mL | Freq: Once | INTRAVENOUS | Status: AC
  Administered 2018-03-27: 30 mL via ORAL

## 2018-03-27 NOTE — ED Provider Notes (Signed)
[facility] Emergency Department Provider Note  Time seen: 6:08 PM  I have reviewed the triage vital signs and the nursing notes.   HISTORY  Chief Complaint Abdominal Pain    HPI Jeffrey Prince is a 52 y.o. male with a past medical history of gastric reflux, diarrhea, hyperlipidemia, presents to the emergency department for abdominal pain and diarrhea as well as intermittent nausea vomiting.  According to the patient for the past 2 weeks he has been experiencing daily abdominal pain and frequent diarrhea.  States a history of diarrhea, was prescribed Lomotil during his last ER visit but he states it was too strong he stop with diarrhea but began vomiting.  He states since he has developed vomiting and diarrhea once again.  Denies any fever.  Denies dysuria.  Largely negative review of systems otherwise.  States moderate diffuse abdominal dull aching pain.   Past Medical History:  Diagnosis Date  . Acne   . Blind left eye    Since birth  . Chorea   . Diverticulosis   . ED (erectile dysfunction)   . GERD (gastroesophageal reflux disease)   . Hemorrhoids   . Weight loss    Abnormal    Patient Active Problem List   Diagnosis Date Noted  . NSTEMI (non-ST elevated myocardial infarction) ([place]) 11/19/2017  . Coronary artery disease 11/03/2017  . ACS (acute coronary syndrome) ([place]) 10/31/2017  . Chest pain 10/30/2017  . Anal fissure   . Family history of Huntington's chorea 07/01/2017  . Unintentional weight loss 05/16/2015  . Diarrhea 05/16/2015  . GERD (gastroesophageal reflux disease) 05/16/2015  . Diffuse abdominal pain 05/16/2015  . Dyslipidemia (high LDL; low HDL) 01/25/2015  . Decreased visual acuity 01/24/2015  . Erectile dysfunction 01/24/2015  . Current every day smoker 01/24/2015  . 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 Hemorrhoids 03/16/2014    Past Surgical History:  Procedure Laterality Date  . BOTOX INJECTION N/A 10/09/2016    Procedure: BOTOX INJECTION;  Surgeon: Jules Husbands, MD;  Location: [facility];  Service: General;  Laterality: N/A;  . BOTOX INJECTION N/A 07/23/2017   Procedure: BOTOX INJECTION;  Surgeon: Jules Husbands, MD;  Location: [facility];  Service: General;  Laterality: N/A;  . COLONOSCOPY     with polyp removal  . CORONARY ARTERY BYPASS GRAFT N/A 11/03/2017   Procedure: CORONARY ARTERY BYPASS GRAFTING (CABG) x four, using left internal mammry artery and right leg greater saphenous vein harvested endoscopically (LIMA to LAD, SVG to OM1, SVG to RAMUS INTERMEDIATE, SVG to PDA)  ;  Surgeon: Grace Isaac, MD;  Location: [place];  Service: Open Heart Surgery;  Laterality: N/A;  . EVALUATION UNDER ANESTHESIA WITH ANAL FISSUROTOMY N/A 10/09/2016   Procedure: EXAM UNDER ANESTHESIA WITH ANAL FISSUROTOMY chemical sphincterotomy w BOtox.;  Surgeon: Jules Husbands, MD;  Location: [facility];  Service: General;  Laterality: N/A;  Lithotomy, make sure botox is in the room  . EVALUATION UNDER ANESTHESIA WITH ANAL FISSUROTOMY N/A 07/23/2017   Procedure: EXAM UNDER ANESTHESIA WITH ANAL FISSUROTOMY, botox injection;  Surgeon: Jules Husbands, MD;  Location: [facility];  Service: General;  Laterality: N/A;  . HEMORRHOID SURGERY    . LEFT HEART CATH AND CORONARY ANGIOGRAPHY Right 10/30/2017   Procedure: LEFT HEART CATH AND CORONARY ANGIOGRAPHY;  Surgeon: Dionisio David, MD;  Location: [place] [facility];  Service: Cardiovascular;  Laterality: Right;  . LEFT HEART CATH AND CORONARY ANGIOGRAPHY Right 11/20/2017   Procedure:  LEFT HEART CATH AND CORONARY ANGIOGRAPHY;  Surgeon: Dionisio David, MD;  Location: [place] [facility];  Service: Cardiovascular;  Laterality: Right;  . ROTATOR CUFF REPAIR  1991  . TEE WITHOUT CARDIOVERSION N/A 11/03/2017   Procedure: TRANSESOPHAGEAL ECHOCARDIOGRAM (TEE);  Surgeon: Grace Isaac, MD;  Location: [place];  Service: Open Heart Surgery;  Laterality: N/A;    Prior to Admission medications    Medication Sig Start Date End Date Taking? Authorizing Provider  aspirin EC 325 MG EC tablet Take 1 tablet (325 mg total) by mouth daily. 11/08/17   Elgie Collard, PA-C  atorvastatin (LIPITOR) 40 MG tablet Take 1 tablet (40 mg total) by mouth daily. 11/08/17   Elgie Collard, PA-C  diphenoxylate-atropine (LOMOTIL) 2.5-0.025 MG tablet Take 1 tablet by mouth 4 (four) times daily as needed for diarrhea or loose stools. 03/16/18 03/16/19  Orbie Pyo, MD  furosemide (LASIX) 40 MG tablet Take 1 tablet (40 mg total) by mouth daily for 5 days. Patient not taking: Reported on 11/19/2017 11/08/17 11/13/17  Elgie Collard, PA-C  hyoscyamine (ANASPAZ) 0.125 MG TBDP disintergrating tablet Place 0.125 mg under the tongue every 4 (four) hours as needed for cramping.     [provider]  isosorbide mononitrate (IMDUR) 30 MG 24 hr tablet Take 1 tablet (30 mg total) by mouth daily. 11/20/17   Henreitta Leber, MD  metoprolol tartrate (LOPRESSOR) 25 MG tablet Take 0.5 tablets (12.5 mg total) by mouth 2 (two) times daily. 11/08/17   Elgie Collard, PA-C  naproxen (NAPROSYN) 500 MG tablet Take 1 tablet (500 mg total) by mouth 2 (two) times daily with a meal. 12/26/17   Lavonia Drafts, MD  potassium chloride SA (K-DUR,KLOR-CON) 20 MEQ tablet Take 1 tablet (20 mEq total) by mouth daily for 5 days. 11/08/17 11/19/17  Elgie Collard, PA-C  ranitidine (ZANTAC) 150 MG tablet Take 1 tablet (150 mg total) by mouth 2 (two) times daily. 12/14/15   Funches, Adriana Mccallum, MD  simethicone (MYLICON) 80 MG chewable tablet Chew 1 tablet (80 mg total) by mouth 4 (four) times daily as needed for flatulence. 11/08/17   Elgie Collard, PA-C  traMADol (ULTRAM) 50 MG tablet Take 1 tablet (50 mg total) by mouth every 6 (six) hours as needed for moderate pain. 11/08/17   Elgie Collard, PA-C    Allergies  Allergen Reactions  . Bee Venom Swelling    SWELLING REACTION UNSPECIFIED  SEVERITY RATED HIGH FROM PMH  . Codeine Diarrhea, Nausea Only  and Rash  . Sulfa Antibiotics Nausea And Vomiting    INCLUDES SPECIFICALLY SULFASALAZINE    Family History  Problem Relation Age of Onset  . Breast cancer Mother   . Huntington's disease Father   . Cancer Paternal Uncle        Colon  . Diabetes Son   . Cancer Maternal Grandfather   . Cancer Paternal Grandmother   . Heart disease Paternal Grandmother   . Huntington's disease Paternal Grandmother     Social History Social History   Tobacco Use  . Smoking status: Former Smoker    Packs/day: 0.50    Years: 20.00    Pack years: 10.00    Types: Cigarettes    Last attempt to quit: 06/16/2017    Years since quitting: 0.7  . Smokeless tobacco: Never Used  Substance Use Topics  . Alcohol use: No    Alcohol/week: 0.0 oz  . Drug use: No    Review  of Systems Constitutional: Negative for fever. Eyes: Negative for visual complaints ENT: Negative for recent illness/congestion Cardiovascular: Negative for chest pain. Respiratory: Negative for shortness of breath. Gastrointestinal: Diffuse abdominal pain intermittent nausea vomiting.  Frequent diarrhea.  Denies black or bloody stool. Genitourinary: Negative for urinary compaints Musculoskeletal: Negative for musculoskeletal complaints Skin: Negative for skin complaints  Neurological: Negative for headache All other ROS negative  ____________________________________________   PHYSICAL EXAM:  VITAL SIGNS: ED Triage Vitals [03/27/18 1458]  Enc Vitals Group     BP 111/69     Pulse Rate 64     Resp 18     Temp 98.7 F (37.1 C)     Temp Source Oral     SpO2 100 %     Weight 138 lb (62.6 kg)     Height 5\' 10"  (1.778 m)     Head Circumference      Peak Flow      Pain Score 8     Pain Loc      Pain Edu?      Excl. in [place]?     Constitutional: Alert and oriented. Well appearing and in no distress. Eyes: Normal exam ENT   Head: Normocephalic and atraumatic.   Mouth/Throat: Mucous membranes are  moist. Cardiovascular: Normal rate, regular rhythm.  Respiratory: Normal respiratory effort without tachypnea nor retractions. Breath sounds are clear Gastrointestinal: Soft, mild diffuse abdominal tenderness to palpation.  No rebound or guarding.  Normal bowel sounds.  No distention. Musculoskeletal: Nontender with normal range of motion in all extremities. Neurologic:  Normal speech and language. No gross focal neurologic deficits  Skin:  Skin is warm, dry and intact.  Psychiatric: Mood and affect are normal.  ____________________________________________   RADIOLOGY  IMPRESSION:  1. Mild wall thickening of the rectum and anus with minimal  surrounding edema, possible proctocolitis.  2. Skin thickening and moderate edema within the subcutaneous fat of  the supraumbilical anterior abdominal wall, possible cellulitis and  soft tissue infection. No definite soft tissue abscess. Recommend  correlation with physical exam.     ____________________________________________   INITIAL IMPRESSION / ASSESSMENT AND PLAN / ED COURSE  Pertinent labs & imaging results that were available during my care of the patient were reviewed by me and considered in my medical decision making (see chart for details).  Patient presents to the emergency department for abdominal pain intermittent nausea vomiting diarrhea.  Patient states his symptoms have been ongoing for 2 weeks.  In reviewing the patient's records it appears that he has had ongoing issues with abdominal pain and diarrhea somewhat chronically.  Patient's labs are normal including normal urinalysis.  However patient states he continues to have pain despite his work-up last time.  Will obtain a CT scan of the abdomen/pelvis to further evaluate.  Patient agreeable to this plan of care.  CT shows possible proctocolitis.  We will cover with antibiotics.  Patient does have nodules of the abdominal wall as noted on the CT, patient states they have  been long-standing.  It is not entirely clear the source of these nodules.  I will have the patient follow-up with his doctor.  Given normal labs and otherwise normal CT we will discharge patient from the emergency department with outpatient follow-up.  ____________________________________________   FINAL CLINICAL IMPRESSION(S) / ED DIAGNOSES  Abdominal pain Nausea vomiting diarrhea    Harvest Dark, MD 03/27/18 2005

## 2018-03-27 NOTE — ED Triage Notes (Signed)
Pt presents to ED via POV c/o upper abd pain and diarrhea. Seen for same 5/13 in this ED (discharged) and given Rx for lomotil that pt states he is unable to keep down.

## 2018-04-03 ENCOUNTER — Emergency Department: Payer: BLUE CROSS/BLUE SHIELD

## 2018-04-03 ENCOUNTER — Encounter: Payer: Self-pay | Admitting: Emergency Medicine

## 2018-04-03 ENCOUNTER — Emergency Department
Admission: EM | Admit: 2018-04-03 | Discharge: 2018-04-03 | Disposition: A | Discharge status: Home / Self Care | Payer: BLUE CROSS/BLUE SHIELD | Attending: Student in an Organized Health Care Education/Training Program | Admitting: Student in an Organized Health Care Education/Training Program

## 2018-04-03 ENCOUNTER — Other Ambulatory Visit: Payer: Self-pay

## 2018-04-03 DIAGNOSIS — Z951 Presence of aortocoronary bypass graft: Secondary | ICD-10-CM | POA: Diagnosis not present

## 2018-04-03 DIAGNOSIS — R079 Chest pain, unspecified: Secondary | ICD-10-CM

## 2018-04-03 DIAGNOSIS — Z87891 Personal history of nicotine dependence: Secondary | ICD-10-CM | POA: Insufficient documentation

## 2018-04-03 DIAGNOSIS — Z79899 Other long term (current) drug therapy: Secondary | ICD-10-CM | POA: Insufficient documentation

## 2018-04-03 DIAGNOSIS — R110 Nausea: Secondary | ICD-10-CM | POA: Insufficient documentation

## 2018-04-03 DIAGNOSIS — Z7902 Long term (current) use of antithrombotics/antiplatelets: Secondary | ICD-10-CM | POA: Diagnosis not present

## 2018-04-03 DIAGNOSIS — Z7982 Long term (current) use of aspirin: Secondary | ICD-10-CM | POA: Insufficient documentation

## 2018-04-03 DIAGNOSIS — I2510 Atherosclerotic heart disease of native coronary artery without angina pectoris: Secondary | ICD-10-CM | POA: Insufficient documentation

## 2018-04-03 DIAGNOSIS — R0789 Other chest pain: Secondary | ICD-10-CM | POA: Insufficient documentation

## 2018-04-03 DIAGNOSIS — R52 Pain, unspecified: Secondary | ICD-10-CM

## 2018-04-03 LAB — BASIC METABOLIC PANEL
Anion gap: 7 (ref 5–15)
BUN: 7 mg/dL (ref 6–20)
CALCIUM: 8.8 mg/dL — AB (ref 8.9–10.3)
CO2: 26 mmol/L (ref 22–32)
CREATININE: 0.8 mg/dL (ref 0.61–1.24)
Chloride: 107 mmol/L (ref 101–111)
GFR calc non Af Amer: >60 mL/min (ref >60)
Glucose, Bld: 92 mg/dL (ref 65–99)
Potassium: 3.6 mmol/L (ref 3.5–5.1)
Sodium: 140 mmol/L (ref 135–145)

## 2018-04-03 LAB — CBC
HCT: 41 % (ref 40.0–52.0)
Hemoglobin: 13.6 g/dL (ref 13.0–18.0)
MCH: 27.6 pg (ref 26.0–34.0)
MCHC: 33.2 g/dL (ref 32.0–36.0)
MCV: 83.3 fL (ref 80.0–100.0)
PLATELETS: 266 10*3/uL (ref 150–440)
RBC: 4.92 MIL/uL (ref 4.40–5.90)
RDW: 15.9 % — AB (ref 11.5–14.5)
WBC: 2.9 10*3/uL — ABNORMAL LOW (ref 3.8–10.6)

## 2018-04-03 LAB — FIBRIN DERIVATIVES D-DIMER (ARMC ONLY): Fibrin derivatives D-dimer (ARMC): 588.72 ng/mL (FEU) — ABNORMAL HIGH (ref 0.00–499.00)

## 2018-04-03 LAB — TROPONIN I: Troponin I: <0.03 ng/mL (ref <0.03)

## 2018-04-03 MED ORDER — HYDROCODONE-ACETAMINOPHEN 5-325 MG PO TABS
1.0000 | ORAL_TABLET | Freq: Once | ORAL | Status: AC
  Administered 2018-04-03: 1 via ORAL
  Filled 2018-04-03: qty 1

## 2018-04-03 MED ORDER — LIDOCAINE 5 % EX PTCH
1.0000 | MEDICATED_PATCH | CUTANEOUS | Status: DC
  Administered 2018-04-03: 1 via TRANSDERMAL
  Filled 2018-04-03: qty 1

## 2018-04-03 MED ORDER — LIDOCAINE 5 % EX PTCH: 1.0000 | MEDICATED_PATCH | Freq: Two times a day (BID) | CUTANEOUS | 0 refills | Status: DC

## 2018-04-03 MED ORDER — SODIUM CHLORIDE 0.9 % IV BOLUS
500.0000 mL | Freq: Once | INTRAVENOUS | Status: AC
  Administered 2018-04-03: 500 mL via INTRAVENOUS

## 2018-04-03 MED ORDER — IOPAMIDOL (ISOVUE-370) INJECTION 76%
75.0000 mL | Freq: Once | INTRAVENOUS | Status: AC | PRN
  Administered 2018-04-03: 75 mL via INTRAVENOUS

## 2018-04-03 MED ORDER — TRAMADOL HCL 50 MG PO TABS: 50.0000 mg | ORAL_TABLET | Freq: Four times a day (QID) | ORAL | 0 refills | Status: AC | PRN

## 2018-04-03 NOTE — ED Triage Notes (Signed)
C/O chest pain, intermittently, x 1 week.  Patient states he had CABG x 4 in January at [facility].  Pain worsens when laying down on side.

## 2018-04-03 NOTE — ED Provider Notes (Signed)
[facility] Emergency Department Provider Note    First MD Initiated Contact with Patient 04/03/18 1458     (approximate)  I have reviewed the triage vital signs and the nursing notes.   HISTORY  Chief Complaint Chest Pain    HPI Jeffrey Prince is a 52 y.o. male with a history of CAD status post CABG presents the ER with over 1 week of constant left-sided chest pain that is worse when he lays down on the left side.   Pain is worse with movement.  States that he has felt some nausea with this but none currently.  States it does hurt to take a deep breath.  Denies any fevers.  Comes to the ER today because he is "tired of dealing with it ".  Does take Plavix.  No other medication changes.  Denies any trauma or cough.   Past Medical History:  Diagnosis Date  . Acne   . Blind left eye    Since birth  . Chorea   . Diverticulosis   . ED (erectile dysfunction)   . GERD (gastroesophageal reflux disease)   . Hemorrhoids   . Weight loss    Abnormal   Family History  Problem Relation Age of Onset  . Breast cancer Mother   . Huntington's disease Father   . Cancer Paternal Uncle        Colon  . Diabetes Son   . Cancer Maternal Grandfather   . Cancer Paternal Grandmother   . Heart disease Paternal Grandmother   . Huntington's disease Paternal Grandmother    Past Surgical History:  Procedure Laterality Date  . BOTOX INJECTION N/A 10/09/2016   Procedure: BOTOX INJECTION;  Surgeon: Jules Husbands, MD;  Location: [facility];  Service: General;  Laterality: N/A;  . BOTOX INJECTION N/A 07/23/2017   Procedure: BOTOX INJECTION;  Surgeon: Jules Husbands, MD;  Location: [facility];  Service: General;  Laterality: N/A;  . CARDIAC SURGERY    . COLONOSCOPY     with polyp removal  . CORONARY ARTERY BYPASS GRAFT N/A 11/03/2017   Procedure: CORONARY ARTERY BYPASS GRAFTING (CABG) x four, using left internal mammry artery and right leg greater saphenous vein harvested  endoscopically (LIMA to LAD, SVG to OM1, SVG to RAMUS INTERMEDIATE, SVG to PDA)  ;  Surgeon: Grace Isaac, MD;  Location: [place];  Service: Open Heart Surgery;  Laterality: N/A;  . EVALUATION UNDER ANESTHESIA WITH ANAL FISSUROTOMY N/A 10/09/2016   Procedure: EXAM UNDER ANESTHESIA WITH ANAL FISSUROTOMY chemical sphincterotomy w BOtox.;  Surgeon: Jules Husbands, MD;  Location: [facility];  Service: General;  Laterality: N/A;  Lithotomy, make sure botox is in the room  . EVALUATION UNDER ANESTHESIA WITH ANAL FISSUROTOMY N/A 07/23/2017   Procedure: EXAM UNDER ANESTHESIA WITH ANAL FISSUROTOMY, botox injection;  Surgeon: Jules Husbands, MD;  Location: [facility];  Service: General;  Laterality: N/A;  . HEMORRHOID SURGERY    . LEFT HEART CATH AND CORONARY ANGIOGRAPHY Right 10/30/2017   Procedure: LEFT HEART CATH AND CORONARY ANGIOGRAPHY;  Surgeon: Dionisio David, MD;  Location: [place] [facility];  Service: Cardiovascular;  Laterality: Right;  . LEFT HEART CATH AND CORONARY ANGIOGRAPHY Right 11/20/2017   Procedure: LEFT HEART CATH AND CORONARY ANGIOGRAPHY;  Surgeon: Dionisio David, MD;  Location: [place] [facility];  Service: Cardiovascular;  Laterality: Right;  . ROTATOR CUFF REPAIR  1991  . TEE WITHOUT CARDIOVERSION N/A 11/03/2017   Procedure: TRANSESOPHAGEAL  ECHOCARDIOGRAM (TEE);  Surgeon: Grace Isaac, MD;  Location: [place];  Service: Open Heart Surgery;  Laterality: N/A;   Patient Active Problem List   Diagnosis Date Noted  . NSTEMI (non-ST elevated myocardial infarction) ([place]) 11/19/2017  . Coronary artery disease 11/03/2017  . ACS (acute coronary syndrome) ([place]) 10/31/2017  . Chest pain 10/30/2017  . Anal fissure   . Family history of Huntington's chorea 07/01/2017  . Unintentional weight loss 05/16/2015  . Diarrhea 05/16/2015  . GERD (gastroesophageal reflux disease) 05/16/2015  . Diffuse abdominal pain 05/16/2015  . Dyslipidemia (high LDL; low HDL) 01/25/2015  . Decreased visual  acuity 01/24/2015  . Erectile dysfunction 01/24/2015  . Current every day smoker 01/24/2015  . 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 Hemorrhoids 03/16/2014      Prior to Admission medications   Medication Sig Start Date End Date Taking? Authorizing Provider  amoxicillin-clavulanate (AUGMENTIN) 875-125 MG tablet Take 1 tablet by mouth 2 (two) times daily for 10 days. 03/27/18 04/06/18  Harvest Dark, MD  aspirin EC 325 MG EC tablet Take 1 tablet (325 mg total) by mouth daily. 11/08/17   Elgie Collard, PA-C  atorvastatin (LIPITOR) 40 MG tablet Take 1 tablet (40 mg total) by mouth daily. 11/08/17   Elgie Collard, PA-C  diphenoxylate-atropine (LOMOTIL) 2.5-0.025 MG tablet Take 1 tablet by mouth 4 (four) times daily as needed for diarrhea or loose stools. 03/16/18 03/16/19  Orbie Pyo, MD  furosemide (LASIX) 40 MG tablet Take 1 tablet (40 mg total) by mouth daily for 5 days. Patient not taking: Reported on 11/19/2017 11/08/17 11/13/17  Elgie Collard, PA-C  hyoscyamine (ANASPAZ) 0.125 MG TBDP disintergrating tablet Place 0.125 mg under the tongue every 4 (four) hours as needed for cramping.     [provider]  isosorbide mononitrate (IMDUR) 30 MG 24 hr tablet Take 1 tablet (30 mg total) by mouth daily. 11/20/17   Henreitta Leber, MD  lidocaine (LIDODERM) 5 % Place 1 patch onto the skin every 12 (twelve) hours. Remove & Discard patch within 12 hours or as directed by MD 04/03/18 04/03/19  Merlyn Lot, MD  metoprolol tartrate (LOPRESSOR) 25 MG tablet Take 0.5 tablets (12.5 mg total) by mouth 2 (two) times daily. 11/08/17   Elgie Collard, PA-C  naproxen (NAPROSYN) 500 MG tablet Take 1 tablet (500 mg total) by mouth 2 (two) times daily with a meal. 12/26/17   Lavonia Drafts, MD  potassium chloride SA (K-DUR,KLOR-CON) 20 MEQ tablet Take 1 tablet (20 mEq total) by mouth daily for 5 days. 11/08/17 11/19/17  Elgie Collard, PA-C  ranitidine (ZANTAC) 150 MG tablet Take 1 tablet (150  mg total) by mouth 2 (two) times daily. 12/14/15   Funches, Adriana Mccallum, MD  simethicone (MYLICON) 80 MG chewable tablet Chew 1 tablet (80 mg total) by mouth 4 (four) times daily as needed for flatulence. 11/08/17   Elgie Collard, PA-C  traMADol (ULTRAM) 50 MG tablet Take 1 tablet (50 mg total) by mouth every 6 (six) hours as needed for moderate pain. 11/08/17   Elgie Collard, PA-C  traMADol (ULTRAM) 50 MG tablet Take 1 tablet (50 mg total) by mouth every 6 (six) hours as needed. 04/03/18 04/03/19  Merlyn Lot, MD    Allergies Bee venom; Codeine; and Sulfa antibiotics    Social History Social History   Tobacco Use  . Smoking status: Former Smoker    Packs/day: 0.50    Years: 20.00    Pack  years: 10.00    Types: Cigarettes    Last attempt to quit: 06/16/2017    Years since quitting: 0.7  . Smokeless tobacco: Never Used  Substance Use Topics  . Alcohol use: No    Alcohol/week: 0.0 oz  . Drug use: No    Review of Systems Patient denies headaches, rhinorrhea, blurry vision, numbness, shortness of breath, chest pain, edema, cough, abdominal pain, nausea, vomiting, diarrhea, dysuria, fevers, rashes or hallucinations unless otherwise stated above in HPI. ____________________________________________   PHYSICAL EXAM:  VITAL SIGNS: Vitals:   04/03/18 1502 04/03/18 1833  BP: 128/86 127/88  Pulse: 62 66  Resp: 18 16  Temp:    SpO2: 100% 100%    Constitutional: Alert and oriented.  Eyes: Conjunctivae are normal.  Head: Atraumatic. Nose: No congestion/rhinnorhea. Mouth/Throat: Mucous membranes are moist.   Neck: No stridor. Painless ROM.  Cardiovascular: Normal rate, regular rhythm. Grossly normal heart sounds.  Good peripheral circulation. Respiratory: Normal respiratory effort.  No retractions. Lungs CTAB. Gastrointestinal: Soft and nontender. No distention. No abdominal bruits. No CVA tenderness. Genitourinary:  Musculoskeletal: No lower extremity tenderness nor edema.  No  joint effusions. Neurologic:  Normal speech and language. No gross focal neurologic deficits are appreciated. No facial droop Skin:  Skin is warm, dry and intact. No rash noted. Psychiatric: Mood and affect are normal. Speech and behavior are normal.  ____________________________________________   LABS (all labs ordered are listed, but only abnormal results are displayed)  Results for orders placed or performed during the hospital encounter of 04/03/18 (from the past 24 hour(s))  Basic metabolic panel     Status: Abnormal   Collection Time: 04/03/18 11:54 AM  Result Value Ref Range   Sodium 140 135 - 145 mmol/L   Potassium 3.6 3.5 - 5.1 mmol/L   Chloride 107 101 - 111 mmol/L   CO2 26 22 - 32 mmol/L   Glucose, Bld 92 65 - 99 mg/dL   BUN 7 6 - 20 mg/dL   Creatinine, Ser 0.80 0.61 - 1.24 mg/dL   Calcium 8.8 (L) 8.9 - 10.3 mg/dL   GFR calc non Af Amer >60 >60 mL/min   GFR calc Af Amer >60 >60 mL/min   Anion gap 7 5 - 15  CBC     Status: Abnormal   Collection Time: 04/03/18 11:54 AM  Result Value Ref Range   WBC 2.9 (L) 3.8 - 10.6 K/uL   RBC 4.92 4.40 - 5.90 MIL/uL   Hemoglobin 13.6 13.0 - 18.0 g/dL   HCT 41.0 40.0 - 52.0 %   MCV 83.3 80.0 - 100.0 fL   MCH 27.6 26.0 - 34.0 pg   MCHC 33.2 32.0 - 36.0 g/dL   RDW 15.9 (H) 11.5 - 14.5 %   Platelets 266 150 - 440 K/uL  Troponin I     Status: None   Collection Time: 04/03/18 11:54 AM  Result Value Ref Range   Troponin I <0.03 <0.03 ng/mL  Troponin I     Status: None   Collection Time: 04/03/18  4:09 PM  Result Value Ref Range   Troponin I <0.03 <0.03 ng/mL  Fibrin derivatives D-Dimer ([facility] only)     Status: Abnormal   Collection Time: 04/03/18  4:09 PM  Result Value Ref Range   Fibrin derivatives D-dimer ([facility]) 588.72 (H) 0.00 - 499.00 ng/mL (FEU)   ____________________________________________  EKG My review and personal interpretation at Time: 11:48   Indication: chest pain  Rate: 55  Rhythm: sinus Axis:  normal Other:  normal intervals, nostemi ____________________________________________  RADIOLOGY I personally reviewed all radiographic images ordered to evaluate for the above acute complaints and reviewed radiology reports and findings.  These findings were personally discussed with the patient.  Please see medical record for radiology report.  ____________________________________________   PROCEDURES  Procedure(s) performed:  Procedures    Critical Care performed: no ____________________________________________   INITIAL IMPRESSION / ASSESSMENT AND PLAN / ED COURSE  Pertinent labs & imaging results that were available during my care of the patient were reviewed by me and considered in my medical decision making (see chart for details).   DDX: ACS, pericarditis, esophagitis, boerhaaves, pe, dissection, pna, bronchitis, costochondritis   Jeffrey Prince is a 52 y.o. who presents to the ED with symptoms as described above.  Patient does have increased cardiac risk factors given previous history and known CAD but symptoms are atypical and EKG shows no acute changes.  Initial troponin is negative.  Will further risk stratify with repeat troponin.  We will also send off a d-dimer as he does have some pleuritic nature to his pain.  If d-dimer elevated will order CT imaging.  Clinical Course as of Apr 03 2325  Fri Apr 03, 2018  1829 CTA shows no evidence of PE or dissection.  Patient remains Heema dynamically stable with serial enzymes that are negative no EKG changes.  Seems primarily musculoskeletal in nature.  Patient will be given prescription for Lidoderm patch as well as short course of pain medication.  Discussed follow-up with PCP as well as referral back to cardiology.  Have discussed with the patient and available family all diagnostics and treatments performed thus far and all questions were answered to the best of my ability. The patient demonstrates understanding and agreement with plan.     [PR]    Clinical Course User Index [PR] Merlyn Lot, MD     As part of my medical decision making, I reviewed the following data within the [place] notes reviewed and incorporated, Labs reviewed, notes from prior ED visits.   ____________________________________________   FINAL CLINICAL IMPRESSION(S) / ED DIAGNOSES  Final diagnoses:  Chest pain, unspecified type      NEW MEDICATIONS STARTED DURING THIS VISIT:  Discharge Medication List as of 04/03/2018  6:52 PM    START taking these medications   Details  lidocaine (LIDODERM) 5 % Place 1 patch onto the skin every 12 (twelve) hours. Remove & Discard patch within 12 hours or as directed by MD, Starting Fri 04/03/2018, Until Sat 04/03/2019, Print    !! traMADol (ULTRAM) 50 MG tablet Take 1 tablet (50 mg total) by mouth every 6 (six) hours as needed., Starting Fri 04/03/2018, Until Sat 04/03/2019, Print     !! - Potential duplicate medications found. Please discuss with provider.       Note:  This document was prepared using Dragon voice recognition software and may include unintentional dictation errors.    Merlyn Lot, MD 04/03/18 [phone]

## 2018-04-09 ENCOUNTER — Encounter: Payer: Self-pay | Admitting: Gastroenterology

## 2018-04-09 ENCOUNTER — Ambulatory Visit (INDEPENDENT_AMBULATORY_CARE_PROVIDER_SITE_OTHER): Payer: BLUE CROSS/BLUE SHIELD | Admitting: Gastroenterology

## 2018-04-09 VITALS — BP 128/69 | HR 51 | Temp 97.6°F | Wt 129.4 lb

## 2018-04-09 DIAGNOSIS — K6289 Other specified diseases of anus and rectum: Secondary | ICD-10-CM

## 2018-04-09 DIAGNOSIS — L03311 Cellulitis of abdominal wall: Secondary | ICD-10-CM

## 2018-04-09 NOTE — Progress Notes (Signed)
Jeffrey Prince [address]  [place]  [place], [place] [address]  Main: [phone]  Fax: [phone]   Gastroenterology Consultation  Referring Provider:     Perrin Maltese, MD Primary Care Physician:  Perrin Maltese, MD Primary Gastroenterologist:  Dr. Vonda Prince Reason for Consultation:    Abnormal CT        HPI:    Chief Complaint  Patient presents with  . Establish Care    thickening of colon wall, polyps    Jeffrey Prince is a 52 y.o. y/o male referred for consultation & management  by Dr. Humphrey Rolls, Nyra Jabs, MD.  Patient with history of chronic abdominal pain, with evaluation by multiple GI providers at different centers, referred to our clinic due to abnormal CT during recent ER visit.  He went to the ER on May 24 due to abdominal pain, diarrhea, nausea and vomiting.  The symptoms have now resolved, and patient is reporting 1-2 formed bowel movements daily.  CT showed mild wall thickening of the rectum and anus with minimal surrounding edema, possible proctocolitis.  No polyps are mentioned on the CT report.  Previous work-up, includes most recent evaluation by Dr. Allen Norris in clinic in August 2018.  Prior to that he was seen by [facility] and [facility].  He was referred to Dr. Allen Norris for abdominal pain and weight loss, and Dr. Dorothey Baseman note mentions that his documented weights have actually been stable over the last 1 to 2 years at the time of his evaluation.  He mentioned multiple CT scans in the past and his note, and previous EGD and colonoscopy that were normal.  He did not think that repeat colonoscopy at the time would give much further information, and elected to manage the patient with dicyclomine.  Last colonoscopy was in March 2017 by Dr. Loletha Carrow for abdominal pain and weight loss, and showed diverticulosis.  Impression red functional abdominal pain, and Bentyl was recommended.  Repeat recommended in 10 years for screening.  EGD was normal at the time  as well.    Past Medical History:  Diagnosis Date  . Acne   . Blind left eye    Since birth  . Chorea   . Diverticulosis   . ED (erectile dysfunction)   . GERD (gastroesophageal reflux disease)   . Hemorrhoids   . Weight loss    Abnormal    Past Surgical History:  Procedure Laterality Date  . BOTOX INJECTION N/A 10/09/2016   Procedure: BOTOX INJECTION;  Surgeon: Jules Husbands, MD;  Location: [facility];  Service: General;  Laterality: N/A;  . BOTOX INJECTION N/A 07/23/2017   Procedure: BOTOX INJECTION;  Surgeon: Jules Husbands, MD;  Location: [facility];  Service: General;  Laterality: N/A;  . CARDIAC SURGERY    . COLONOSCOPY     with polyp removal  . CORONARY ARTERY BYPASS GRAFT N/A 11/03/2017   Procedure: CORONARY ARTERY BYPASS GRAFTING (CABG) x four, using left internal mammry artery and right leg greater saphenous vein harvested endoscopically (LIMA to LAD, SVG to OM1, SVG to RAMUS INTERMEDIATE, SVG to PDA)  ;  Surgeon: Grace Isaac, MD;  Location: [facility];  Service: Open Heart Surgery;  Laterality: N/A;  . EVALUATION UNDER ANESTHESIA WITH ANAL FISSUROTOMY N/A 10/09/2016   Procedure: EXAM UNDER ANESTHESIA WITH ANAL FISSUROTOMY chemical sphincterotomy w BOtox.;  Surgeon: Jules Husbands, MD;  Location: [facility];  Service: General;  Laterality: N/A;  Lithotomy, make sure botox  is in the room  . EVALUATION UNDER ANESTHESIA WITH ANAL FISSUROTOMY N/A 07/23/2017   Procedure: EXAM UNDER ANESTHESIA WITH ANAL FISSUROTOMY, botox injection;  Surgeon: Jules Husbands, MD;  Location: [facility];  Service: General;  Laterality: N/A;  . HEMORRHOID SURGERY    . LEFT HEART CATH AND CORONARY ANGIOGRAPHY Right 10/30/2017   Procedure: LEFT HEART CATH AND CORONARY ANGIOGRAPHY;  Surgeon: Dionisio David, MD;  Location: [facility];  Service: Cardiovascular;  Laterality: Right;  . LEFT HEART CATH AND CORONARY ANGIOGRAPHY Right 11/20/2017   Procedure: LEFT HEART CATH AND CORONARY ANGIOGRAPHY;   Surgeon: Dionisio David, MD;  Location: [facility];  Service: Cardiovascular;  Laterality: Right;  . ROTATOR CUFF REPAIR  1991  . TEE WITHOUT CARDIOVERSION N/A 11/03/2017   Procedure: TRANSESOPHAGEAL ECHOCARDIOGRAM (TEE);  Surgeon: Grace Isaac, MD;  Location: [facility];  Service: Open Heart Surgery;  Laterality: N/A;    Prior to Admission medications   Medication Sig Start Date End Date Taking? Authorizing Provider  aspirin EC 325 MG EC tablet Take 1 tablet (325 mg total) by mouth daily. 11/08/17   Elgie Collard, PA-C  atorvastatin (LIPITOR) 40 MG tablet Take 1 tablet (40 mg total) by mouth daily. 11/08/17   Elgie Collard, PA-C  diphenoxylate-atropine (LOMOTIL) 2.5-0.025 MG tablet Take 1 tablet by mouth 4 (four) times daily as needed for diarrhea or loose stools. 03/16/18 03/16/19  Orbie Pyo, MD  furosemide (LASIX) 40 MG tablet Take 1 tablet (40 mg total) by mouth daily for 5 days. Patient not taking: Reported on 11/19/2017 11/08/17 11/13/17  Elgie Collard, PA-C  hyoscyamine (ANASPAZ) 0.125 MG TBDP disintergrating tablet Place 0.125 mg under the tongue every 4 (four) hours as needed for cramping.     [provider]  isosorbide mononitrate (IMDUR) 30 MG 24 hr tablet Take 1 tablet (30 mg total) by mouth daily. 11/20/17   Henreitta Leber, MD  lidocaine (LIDODERM) 5 % Place 1 patch onto the skin every 12 (twelve) hours. Remove & Discard patch within 12 hours or as directed by MD 04/03/18 04/03/19  Merlyn Lot, MD  metoprolol tartrate (LOPRESSOR) 25 MG tablet Take 0.5 tablets (12.5 mg total) by mouth 2 (two) times daily. 11/08/17   Elgie Collard, PA-C  naproxen (NAPROSYN) 500 MG tablet Take 1 tablet (500 mg total) by mouth 2 (two) times daily with a meal. 12/26/17   Lavonia Drafts, MD  potassium chloride SA (K-DUR,KLOR-CON) 20 MEQ tablet Take 1 tablet (20 mEq total) by mouth daily for 5 days. 11/08/17 11/19/17  Elgie Collard, PA-C  ranitidine (ZANTAC) 150 MG tablet Take  1 tablet (150 mg total) by mouth 2 (two) times daily. 12/14/15   Funches, Adriana Mccallum, MD  simethicone (MYLICON) 80 MG chewable tablet Chew 1 tablet (80 mg total) by mouth 4 (four) times daily as needed for flatulence. 11/08/17   Elgie Collard, PA-C  traMADol (ULTRAM) 50 MG tablet Take 1 tablet (50 mg total) by mouth every 6 (six) hours as needed for moderate pain. 11/08/17   Elgie Collard, PA-C  traMADol (ULTRAM) 50 MG tablet Take 1 tablet (50 mg total) by mouth every 6 (six) hours as needed. 04/03/18 04/03/19  Merlyn Lot, MD    Family History  Problem Relation Age of Onset  . Breast cancer Mother   . Huntington's disease Father   . Cancer Paternal Uncle        Colon  . Diabetes  Son   . Cancer Maternal Grandfather   . Cancer Paternal Grandmother   . Heart disease Paternal Grandmother   . Huntington's disease Paternal Grandmother      Social History   Tobacco Use  . Smoking status: Former Smoker    Packs/day: 0.50    Years: 20.00    Pack years: 10.00    Types: Cigarettes    Last attempt to quit: 06/16/2017    Years since quitting: 0.8  . Smokeless tobacco: Never Used  Substance Use Topics  . Alcohol use: No    Alcohol/week: 0.0 oz  . Drug use: No    Allergies as of 04/09/2018 - Review Complete 04/09/2018  Allergen Reaction Noted  . Bee venom Swelling 12/16/2013  . Codeine Diarrhea, Nausea Only, and Rash 11/19/2013  . Sulfa antibiotics Nausea And Vomiting 09/09/2016    Review of Systems:    All systems reviewed and negative except where noted in HPI.   Physical Exam:  BP 128/69   Pulse (!) 51   Temp 97.6 F (36.4 C) (Oral)   Wt 129 lb 6.4 oz (58.7 kg)   BMI 18.57 kg/m  No LMP for male patient. Psych:  Alert and cooperative. Normal mood and affect. General:   Alert,  Well-developed, well-nourished, pleasant and cooperative in NAD Head:  Normocephalic and atraumatic. Eyes:  Sclera clear, no icterus.   Conjunctiva pink. Ears:  Normal auditory acuity. Nose:  No  deformity, discharge, or lesions. Mouth:  No deformity or lesions,oropharynx pink & moist. Neck:  Supple; no masses or thyromegaly. Lungs:  Respirations even and unlabored.  Clear throughout to auscultation.   No wheezes, crackles, or rhonchi. No acute distress. Heart:  Regular rate and rhythm; no murmurs, clicks, rubs, or gallops. Abdomen:  Normal bowel sounds.  No bruits.  Soft, non-tender and non-distended without masses, hepatosplenomegaly or hernias noted.  No guarding or rebound tenderness.    Nontender 1-2 nodules present on the abdominal wall skin, no discharge present. Msk:  Symmetrical without gross deformities. Good, equal movement & strength bilaterally. Pulses:  Normal pulses noted. Extremities:  No clubbing or edema.  No cyanosis. Neurologic:  Alert and oriented x3;  grossly normal neurologically. Skin:  Intact without significant lesions or rashes. No jaundice. Lymph Nodes:  No significant cervical adenopathy. Psych:  Alert and cooperative. Normal mood and affect.   Labs: CBC    Component Value Date/Time   WBC 2.9 (L) 04/03/2018 1154   RBC 4.92 04/03/2018 1154   HGB 13.6 04/03/2018 1154   HGB 14.3 10/10/2014 0842   HCT 41.0 04/03/2018 1154   HCT 43.9 10/10/2014 0842   PLT 266 04/03/2018 1154   PLT 204 10/10/2014 0842   MCV 83.3 04/03/2018 1154   MCV 88 10/10/2014 0842   MCH 27.6 04/03/2018 1154   MCHC 33.2 04/03/2018 1154   RDW 15.9 (H) 04/03/2018 1154   RDW 14.7 (H) 10/10/2014 0842   LYMPHSABS 0.9 (L) 11/19/2017 1435   LYMPHSABS 1.3 05/05/2014 1511   MONOABS 0.5 11/19/2017 1435   MONOABS 0.5 05/05/2014 1511   EOSABS 0.2 11/19/2017 1435   EOSABS 0.2 05/05/2014 1511   BASOSABS 0.1 11/19/2017 1435   BASOSABS 0.0 05/05/2014 1511   CMP     Component Value Date/Time   NA 140 04/03/2018 1154   NA 140 10/10/2014 0842   K 3.6 04/03/2018 1154   K 4.1 10/10/2014 0842   CL 107 04/03/2018 1154   CL 109 (H) 10/10/2014 0842   CO2 26  04/03/2018 1154   CO2 26  10/10/2014 0842   GLUCOSE 92 04/03/2018 1154   GLUCOSE 94 10/10/2014 0842   BUN 7 04/03/2018 1154   BUN 5 (L) 10/10/2014 0842   CREATININE 0.80 04/03/2018 1154   CREATININE 0.82 10/10/2014 0842   CALCIUM 8.8 (L) 04/03/2018 1154   CALCIUM 8.6 10/10/2014 0842   PROT 7.6 03/27/2018 1500   PROT 7.0 10/10/2014 0842   ALBUMIN 4.0 03/27/2018 1500   ALBUMIN 3.4 10/10/2014 0842   AST 22 03/27/2018 1500   AST 21 10/10/2014 0842   ALT 19 03/27/2018 1500   ALT 34 10/10/2014 0842   ALKPHOS 68 03/27/2018 1500   ALKPHOS 52 10/10/2014 0842   BILITOT 0.7 03/27/2018 1500   BILITOT 0.5 10/10/2014 0842   GFRNONAA >60 04/03/2018 1154   GFRNONAA >60 10/10/2014 0842   GFRNONAA >60 05/05/2014 1511   GFRAA >60 04/03/2018 1154   GFRAA >60 10/10/2014 0842   GFRAA >60 05/05/2014 1511    Imaging Studies: Dg Chest 2 View  Result Date: 04/03/2018 CLINICAL DATA:  Intermittent chest pain EXAM: CHEST - 2 VIEW COMPARISON:  12/26/2017 FINDINGS: Normal heart size and mediastinal contours. Status post CABG. Chronic interstitial coarsening. There is no edema, consolidation, effusion, or pneumothorax. Surgical anchors along the left glenoid. Remote bilateral rib fractures. IMPRESSION: Stable from prior.  No evidence of active disease. Electronically Signed   By: Monte Fantasia M.D.   On: 04/03/2018 12:16   Dg Ribs Unilateral Left  Result Date: 04/03/2018 CLINICAL DATA:  Left rib pain. EXAM: LEFT RIBS - 2 VIEW COMPARISON:  Radiographs of December 26, 2017. FINDINGS: Multiple old left rib fractures are noted. No acute abnormality is noted. IMPRESSION: No acute left rib fractures are noted. Electronically Signed   By: Marijo Conception, M.D.   On: 04/03/2018 16:12   Ct Angio Chest Pe W And/or Wo Contrast  Result Date: 04/03/2018 CLINICAL DATA:  Chest pain for 1 week. EXAM: CT ANGIOGRAPHY CHEST WITH CONTRAST TECHNIQUE: Multidetector CT imaging of the chest was performed using the standard protocol during bolus  administration of intravenous contrast. Multiplanar CT image reconstructions and MIPs were obtained to evaluate the vascular anatomy. CONTRAST:  75mL ISOVUE-370 IOPAMIDOL (ISOVUE-370) INJECTION 76% COMPARISON:  Chest x-ray Apr 03, 2018 FINDINGS: Cardiovascular: The heart size is normal. Coronary artery calcifications are noted. Mild atherosclerotic change in the nonaneurysmal aorta. No dissection. No pulmonary emboli. Mediastinum/Nodes: No enlarged mediastinal, hilar, or axillary lymph nodes. Thyroid gland, trachea, and esophagus demonstrate no significant findings. Lungs/Pleura: Lungs are clear. No pleural effusion or pneumothorax. Upper Abdomen: No acute abnormality. Musculoskeletal: No chest wall abnormality. No acute or significant osseous findings. Review of the MIP images confirms the above findings. IMPRESSION: 1. No pulmonary emboli. 2. Mild atherosclerotic changes in the nonaneurysmal aorta. Coronary artery calcifications. 3. No other abnormalities. Aortic Atherosclerosis (ICD10-I70.0). Electronically Signed   By: Dorise Bullion III M.D   On: 04/03/2018 18:23   Ct Abdomen Pelvis W Contrast  Result Date: 03/27/2018 CLINICAL DATA:  Generalized abdominal pain with diarrhea EXAM: CT ABDOMEN AND PELVIS WITH CONTRAST TECHNIQUE: Multidetector CT imaging of the abdomen and pelvis was performed using the standard protocol following bolus administration of intravenous contrast. CONTRAST:  124mL ISOVUE-300 IOPAMIDOL (ISOVUE-300) INJECTION 61% COMPARISON:  04/29/2017 FINDINGS: Lower chest: Lung bases demonstrate no acute consolidation or pleural effusion. The heart size is within normal limits. Hepatobiliary: No focal liver abnormality is seen. No gallstones, gallbladder wall thickening, or biliary dilatation. Pancreas: Unremarkable. No pancreatic  ductal dilatation or surrounding inflammatory changes. Spleen: Normal in size without focal abnormality. Adrenals/Urinary Tract: Adrenal glands are unremarkable. Kidneys  are normal, without renal calculi, focal lesion, or hydronephrosis. Bladder is unremarkable. Stomach/Bowel: Stomach is nonenlarged. No dilated small bowel. Appendix not well seen but no right lower quadrant inflammation. Mild wall thickening of the anus and rectum. Vascular/Lymphatic: Mild aortic atherosclerosis. No aneurysmal dilatation. Prominent inguinal lymph nodes as before. Multiple prominent vessels anterior rectum. Reproductive: Slightly enlarged prostate, mildly heterogeneous Other: Negative for free air or free fluid. Skin thickening and moderate edema within the anterior abdominal wall, above the umbilical region. No discrete soft tissue abscess Musculoskeletal: No acute or significant osseous findings. IMPRESSION: 1. Mild wall thickening of the rectum and anus with minimal surrounding edema, possible proctocolitis. 2. Skin thickening and moderate edema within the subcutaneous fat of the supraumbilical anterior abdominal wall, possible cellulitis and soft tissue infection. No definite soft tissue abscess. Recommend correlation with physical exam. Electronically Signed   By: Donavan Foil M.D.   On: 03/27/2018 19:23    Assessment and Plan:   Jeffrey Prince is a 52 y.o. y/o male has been referred for wall thickening of the rectum and anus seen on recent CT scan done for abdominal pain  New CT scan finding needs visualization, and colonoscopy is indicated to rule out any underlying mass I have discussed alternative options, risks & benefits,  which include, but are not limited to, bleeding, infection, perforation,respiratory complication & drug reaction.  The patient agrees with this plan & written consent will be obtained.    It could also be due to stool in the colon, or under distention Patient is reporting 1-2 formed bowel movements daily at this time Stercoral ulcer also can also cause this finding  Due to CT finding of thickening of subcutaneous fat of the anterior abdominal wall,  possible cellulitis, will refer to surgery as well.  Patient states he was given antibiotics by his primary care provider for this, but due to persistent nodules, will have surgery evaluate.  Patient agreeable with referral.    Dr Jeffrey Prince

## 2018-04-10 ENCOUNTER — Other Ambulatory Visit: Payer: Self-pay

## 2018-04-10 DIAGNOSIS — K6289 Other specified diseases of anus and rectum: Secondary | ICD-10-CM

## 2018-04-10 MED ORDER — PEG 3350-KCL-NA BICARB-NACL 420 G PO SOLR: ORAL | 0 refills | Status: DC

## 2018-04-10 MED ORDER — BISACODYL 5 MG PO TBEC: 10.0000 mg | DELAYED_RELEASE_TABLET | Freq: Once | ORAL | 0 refills | Status: AC

## 2018-04-10 NOTE — Addendum Note (Signed)
Addended by: Earl Lagos on: 04/10/2018 04:22 PM   Modules accepted: Orders

## 2018-04-14 ENCOUNTER — Other Ambulatory Visit: Payer: Self-pay

## 2018-04-14 MED ORDER — BISACODYL 5 MG PO TBEC: 10.0000 mg | DELAYED_RELEASE_TABLET | Freq: Once | ORAL | 0 refills | Status: AC

## 2018-04-20 ENCOUNTER — Encounter: Payer: Self-pay | Admitting: General Surgery

## 2018-04-22 ENCOUNTER — Encounter: Payer: Self-pay | Admitting: *Deleted

## 2018-04-23 ENCOUNTER — Encounter
Admission: RE | Disposition: A | Discharge status: Home / Self Care | Payer: Self-pay | Source: Ambulatory Visit | Attending: Gastroenterology

## 2018-04-23 ENCOUNTER — Ambulatory Visit
Admission: RE | Admit: 2018-04-23 | Discharge: 2018-04-23 | Disposition: A | Discharge status: Home / Self Care | Payer: BLUE CROSS/BLUE SHIELD | Source: Ambulatory Visit | Attending: Gastroenterology | Admitting: Gastroenterology

## 2018-04-23 ENCOUNTER — Encounter: Payer: Self-pay | Admitting: *Deleted

## 2018-04-23 ENCOUNTER — Ambulatory Visit: Payer: BLUE CROSS/BLUE SHIELD | Admitting: Anesthesiology

## 2018-04-23 DIAGNOSIS — K6289 Other specified diseases of anus and rectum: Secondary | ICD-10-CM

## 2018-04-23 DIAGNOSIS — Z885 Allergy status to narcotic agent status: Secondary | ICD-10-CM | POA: Diagnosis not present

## 2018-04-23 DIAGNOSIS — Z87891 Personal history of nicotine dependence: Secondary | ICD-10-CM | POA: Insufficient documentation

## 2018-04-23 DIAGNOSIS — I10 Essential (primary) hypertension: Secondary | ICD-10-CM | POA: Insufficient documentation

## 2018-04-23 DIAGNOSIS — I2510 Atherosclerotic heart disease of native coronary artery without angina pectoris: Secondary | ICD-10-CM | POA: Diagnosis not present

## 2018-04-23 DIAGNOSIS — D120 Benign neoplasm of cecum: Secondary | ICD-10-CM | POA: Diagnosis not present

## 2018-04-23 DIAGNOSIS — Z7982 Long term (current) use of aspirin: Secondary | ICD-10-CM | POA: Insufficient documentation

## 2018-04-23 DIAGNOSIS — K529 Noninfective gastroenteritis and colitis, unspecified: Secondary | ICD-10-CM | POA: Insufficient documentation

## 2018-04-23 DIAGNOSIS — Z882 Allergy status to sulfonamides status: Secondary | ICD-10-CM | POA: Insufficient documentation

## 2018-04-23 DIAGNOSIS — D125 Benign neoplasm of sigmoid colon: Secondary | ICD-10-CM | POA: Diagnosis not present

## 2018-04-23 DIAGNOSIS — Z79899 Other long term (current) drug therapy: Secondary | ICD-10-CM | POA: Diagnosis not present

## 2018-04-23 DIAGNOSIS — Z91030 Bee allergy status: Secondary | ICD-10-CM | POA: Diagnosis not present

## 2018-04-23 DIAGNOSIS — R933 Abnormal findings on diagnostic imaging of other parts of digestive tract: Secondary | ICD-10-CM | POA: Diagnosis not present

## 2018-04-23 DIAGNOSIS — Z951 Presence of aortocoronary bypass graft: Secondary | ICD-10-CM | POA: Diagnosis not present

## 2018-04-23 DIAGNOSIS — K219 Gastro-esophageal reflux disease without esophagitis: Secondary | ICD-10-CM | POA: Insufficient documentation

## 2018-04-23 DIAGNOSIS — K635 Polyp of colon: Secondary | ICD-10-CM

## 2018-04-23 HISTORY — DX: Angina pectoris, unspecified: I20.9

## 2018-04-23 HISTORY — DX: Cardiac arrhythmia, unspecified: I49.9

## 2018-04-23 HISTORY — DX: Essential (primary) hypertension: I10

## 2018-04-23 HISTORY — PX: COLONOSCOPY WITH PROPOFOL: SHX5780

## 2018-04-23 SURGERY — COLONOSCOPY WITH PROPOFOL
Anesthesia: General

## 2018-04-23 MED ORDER — PROPOFOL 10 MG/ML IV BOLUS
INTRAVENOUS | Status: DC | PRN
  Administered 2018-04-23: 60 mg via INTRAVENOUS

## 2018-04-23 MED ORDER — PROPOFOL 500 MG/50ML IV EMUL
INTRAVENOUS | Status: DC | PRN
  Administered 2018-04-23: 140 ug/kg/min via INTRAVENOUS

## 2018-04-23 MED ORDER — LIDOCAINE HCL (CARDIAC) PF 100 MG/5ML IV SOSY
PREFILLED_SYRINGE | INTRAVENOUS | Status: DC | PRN
  Administered 2018-04-23: 40 mg via INTRAVENOUS

## 2018-04-23 MED ORDER — SODIUM CHLORIDE 0.9 % IV SOLN
INTRAVENOUS | Status: DC
  Administered 2018-04-23: 1000 mL via INTRAVENOUS

## 2018-04-23 NOTE — H&P (Signed)
Vonda Antigua, MD [address], [place], [place], [place], [address] [address], [place], [place], [place], [address] Phone: [phone]  Fax: [phone]  Primary Care Physician:  Perrin Maltese, MD   Pre-Procedure History & Physical: HPI:  Jeffrey Prince is a 52 y.o. male is here for a colonoscopy.   Past Medical History:  Diagnosis Date  . Acne   . Anginal pain ([facility])   . Blind left eye    Since birth  . Chorea   . Diverticulosis   . Dysrhythmia   . ED (erectile dysfunction)   . GERD (gastroesophageal reflux disease)   . Hemorrhoids   . Hypertension   . Weight loss    Abnormal    Past Surgical History:  Procedure Laterality Date  . BOTOX INJECTION N/A 10/09/2016   Procedure: BOTOX INJECTION;  Surgeon: Jules Husbands, MD;  Location: [facility];  Service: General;  Laterality: N/A;  . BOTOX INJECTION N/A 07/23/2017   Procedure: BOTOX INJECTION;  Surgeon: Jules Husbands, MD;  Location: [facility];  Service: General;  Laterality: N/A;  . CARDIAC SURGERY    . COLONOSCOPY     with polyp removal  . CORONARY ARTERY BYPASS GRAFT N/A 11/03/2017   Procedure: CORONARY ARTERY BYPASS GRAFTING (CABG) x four, using left internal mammry artery and right leg greater saphenous vein harvested endoscopically (LIMA to LAD, SVG to OM1, SVG to RAMUS INTERMEDIATE, SVG to PDA)  ;  Surgeon: Grace Isaac, MD;  Location: [facility];  Service: Open Heart Surgery;  Laterality: N/A;  . EVALUATION UNDER ANESTHESIA WITH ANAL FISSUROTOMY N/A 10/09/2016   Procedure: EXAM UNDER ANESTHESIA WITH ANAL FISSUROTOMY chemical sphincterotomy w BOtox.;  Surgeon: Jules Husbands, MD;  Location: [facility];  Service: General;  Laterality: N/A;  Lithotomy, make sure botox is in the room  . EVALUATION UNDER ANESTHESIA WITH ANAL FISSUROTOMY N/A 07/23/2017   Procedure: EXAM UNDER ANESTHESIA WITH ANAL FISSUROTOMY, botox injection;  Surgeon: Jules Husbands, MD;  Location: [facility];  Service: General;  Laterality: N/A;    . HEMORRHOID SURGERY    . LEFT HEART CATH AND CORONARY ANGIOGRAPHY Right 10/30/2017   Procedure: LEFT HEART CATH AND CORONARY ANGIOGRAPHY;  Surgeon: Dionisio David, MD;  Location: [facility];  Service: Cardiovascular;  Laterality: Right;  . LEFT HEART CATH AND CORONARY ANGIOGRAPHY Right 11/20/2017   Procedure: LEFT HEART CATH AND CORONARY ANGIOGRAPHY;  Surgeon: Dionisio David, MD;  Location: [facility];  Service: Cardiovascular;  Laterality: Right;  . ROTATOR CUFF REPAIR  1991  . TEE WITHOUT CARDIOVERSION N/A 11/03/2017   Procedure: TRANSESOPHAGEAL ECHOCARDIOGRAM (TEE);  Surgeon: Grace Isaac, MD;  Location: [facility];  Service: Open Heart Surgery;  Laterality: N/A;    Prior to Admission medications   Medication Sig Start Date End Date Taking? Authorizing Provider  hyoscyamine (ANASPAZ) 0.125 MG TBDP disintergrating tablet Place 0.125 mg under the tongue every 4 (four) hours as needed for cramping.    Yes [provider]  isosorbide mononitrate (IMDUR) 30 MG 24 hr tablet Take 1 tablet (30 mg total) by mouth daily. 11/20/17  Yes Henreitta Leber, MD  polyethylene glycol-electrolytes (NULYTELY/GOLYTELY) 420 g solution As directed 04/10/18  Yes Virgel Manifold, MD  REPATHA 140 MG/ML SOSY  02/16/18  Yes [provider]  traMADol (ULTRAM) 50 MG tablet Take 1 tablet (50 mg total) by mouth every 6 (six) hours as needed. 04/03/18 04/03/19 Yes Merlyn Lot, MD  aspirin EC 325 MG EC tablet Take 1 tablet (325 mg total) by mouth daily. Patient not taking: Reported on 04/10/2018 11/08/17   Elgie Collard, PA-C  atorvastatin (LIPITOR) 40 MG tablet Take 1 tablet (40 mg total) by mouth daily. Patient not taking: Reported on 04/10/2018 11/08/17   Elgie Collard, PA-C  diphenoxylate-atropine (LOMOTIL) 2.5-0.025 MG tablet Take 1 tablet by mouth 4 (four) times daily as needed for diarrhea or loose stools. Patient not taking: Reported on 04/10/2018 03/16/18 03/16/19  Orbie Pyo, MD  furosemide (LASIX) 40 MG tablet Take 1 tablet (40 mg total) by mouth daily for 5 days. Patient not taking: Reported on 11/19/2017 11/08/17 11/13/17  Elgie Collard, PA-C  hydrocortisone (PROCTOZONE-HC) 2.5 % rectal cream  10/30/16   [provider]  lidocaine (LIDODERM) 5 % Place 1 patch onto the skin every 12 (twelve) hours. Remove & Discard patch within 12 hours or as directed by MD Patient not taking: Reported on 04/23/2018 04/03/18 04/03/19  Merlyn Lot, MD  metoprolol tartrate (LOPRESSOR) 25 MG tablet Take 0.5 tablets (12.5 mg total) by mouth 2 (two) times daily. Patient not taking: Reported on 04/23/2018 11/08/17   Elgie Collard, PA-C  naproxen (NAPROSYN) 500 MG tablet Take 1 tablet (500 mg total) by mouth 2 (two) times daily with a meal. Patient not taking: Reported on 04/23/2018 12/26/17   Lavonia Drafts, MD  potassium chloride SA (K-DUR,KLOR-CON) 20 MEQ tablet Take 1 tablet (20 mEq total) by mouth daily for 5 days. 11/08/17 11/19/17  Elgie Collard, PA-C  simethicone (MYLICON) 80 MG chewable tablet Chew 1 tablet (80 mg total) by mouth 4 (four) times daily as needed for flatulence. Patient not taking: Reported on 04/10/2018 11/08/17   Elgie Collard, PA-C    Allergies as of 04/13/2018 - Review Complete 04/09/2018  Allergen Reaction Noted  . Bee venom Swelling 12/16/2013  . Codeine Diarrhea, Nausea Only, and Rash 11/19/2013  . Sulfa antibiotics Nausea And Vomiting 09/09/2016    Family History  Problem Relation Age of Onset  . Breast cancer Mother   . Huntington's disease Father   . Cancer Paternal Uncle        Colon  . Diabetes Son   . Cancer Maternal Grandfather   . Cancer Paternal Grandmother   . Heart disease Paternal Grandmother   . Huntington's disease Paternal Grandmother     Social History   Socioeconomic History  . Marital status: Single    Spouse name: Not on file  . Number of children: Not on file  . Years of education: Not on file  . Highest  education level: Not on file  Occupational History  . Not on file  Social Needs  . Financial resource strain: Not on file  . Food insecurity:    Worry: Not on file    Inability: Not on file  . Transportation needs:    Medical: Not on file    Non-medical: Not on file  Tobacco Use  . Smoking status: Former Smoker    Packs/day: 0.50    Years: 20.00    Pack years: 10.00    Types: Cigarettes    Last attempt to quit: 06/16/2017    Years since quitting: 0.8  . Smokeless tobacco: Never Used  Substance and Sexual Activity  . Alcohol use: No    Alcohol/week: 0.0 oz  . Drug use: No  . Sexual activity: Not on file  Lifestyle  . Physical activity:    Days  per week: Not on file    Minutes per session: Not on file  . Stress: Not on file  Relationships  . Social connections:    Talks on phone: Not on file    Gets together: Not on file    Attends religious service: Not on file    Active member of club or organization: Not on file    Attends meetings of clubs or organizations: Not on file    Relationship status: Not on file  . Intimate partner violence:    Fear of current or ex partner: Not on file    Emotionally abused: Not on file    Physically abused: Not on file    Forced sexual activity: Not on file  Other Topics Concern  . Not on file  Social History Narrative  . Not on file    Review of Systems: See HPI, otherwise negative ROS  Physical Exam: BP (!) 150/87   Pulse 98   Temp 98.2 F (36.8 C) (Tympanic)   Resp 16   Ht 5\' 10"  (1.778 m)   Wt 135 lb (61.2 kg)   SpO2 100%   BMI 19.37 kg/m  General:   Alert,  pleasant and cooperative in NAD Head:  Normocephalic and atraumatic. Neck:  Supple; no masses or thyromegaly. Lungs:  Clear throughout to auscultation, normal respiratory effort.    Heart:  +S1, +S2, Regular rate and rhythm, No edema. Abdomen:  Soft, nontender and nondistended. Normal bowel sounds, without guarding, and without rebound.   Neurologic:  Alert and   oriented x4;  grossly normal neurologically.  Impression/Plan: Jeffrey Prince is here for a colonoscopy to be performed for Abnormal CT.   Risks, benefits, limitations, and alternatives regarding  colonoscopy have been reviewed with the patient.  Questions have been answered.  All parties agreeable.   Virgel Manifold, MD  04/23/2018, 9:03 AM

## 2018-04-23 NOTE — Op Note (Signed)
[facility] Gastroenterology Patient Name: Jeffrey Prince Procedure Date: 04/23/2018 9:11 AM MRN: [id number] Account #: [id number] Date of Birth: 1966/03/20 Admit Type: Outpatient Age: 52 Room: [facility] ENDO ROOM 1 Gender: Male Note Status: Finalized Procedure:            Colonoscopy Indications:          Abnormal CT of the GI tract Providers:            Suleika Donavan B. Bonna Gains MD, MD Referring MD:         Forest Gleason Md, MD (Referring MD) Medicines:            Monitored Anesthesia Care Complications:        No immediate complications. Procedure:            Pre-Anesthesia Assessment:                       - ASA Grade Assessment: III - A patient with severe                        systemic disease.                       - Prior to the procedure, a History and Physical was                        performed, and patient medications, allergies and                        sensitivities were reviewed. The patient's tolerance of                        previous anesthesia was reviewed.                       - The risks and benefits of the procedure and the                        sedation options and risks were discussed with the                        patient. All questions were answered and informed                        consent was obtained.                       - Patient identification and proposed procedure were                        verified prior to the procedure by the physician, the                        nurse, the anesthesiologist, the anesthetist and the                        technician. The procedure was verified in the procedure                        room.  After obtaining informed consent, the colonoscope was                        passed under direct vision. Throughout the procedure,                        the patient's blood pressure, pulse, and oxygen                        saturations were monitored continuously. The     Colonoscope was introduced through the anus and                        advanced to the the cecum, identified by appendiceal                        orifice and ileocecal valve. The colonoscopy was                        performed with ease. The patient tolerated the                        procedure well. The quality of the bowel preparation                        was good. Findings:      The perianal and digital rectal examinations were normal.      A 3 mm polyp was found in the cecum. The polyp was sessile. The polyp       was removed with a cold biopsy forceps. Resection and retrieval were       complete.      A 4 mm polyp was found in the sigmoid colon. The polyp was sessile. The       polyp was removed with a cold biopsy forceps. Resection and retrieval       were complete.      The exam was otherwise without abnormality.      There is no endoscopic evidence of inflammation or mass in the anus and       in the rectum.      The rectum, sigmoid colon, descending colon, transverse colon, ascending       colon and cecum appeared normal.      The retroflexed view of the distal rectum and anal verge was normal and       showed no anal or rectal abnormalities. Impression:           - One 3 mm polyp in the cecum, removed with a cold                        biopsy forceps. Resected and retrieved.                       - One 4 mm polyp in the sigmoid colon, removed with a                        cold biopsy forceps. Resected and retrieved.                       - The examination was otherwise normal.                       -  The rectum, sigmoid colon, descending colon,                        transverse colon, ascending colon and cecum are normal.                       - The distal rectum and anal verge are normal on                        retroflexion view. Recommendation:       - Discharge patient to home (with escort).                       - Advance diet as tolerated.                        - Continue present medications.                       - Await pathology results.                       - Repeat colonoscopy in 5 years for surveillance.                       - The findings and recommendations were discussed with                        the patient.                       - The findings and recommendations were discussed with                        the patient's family.                       - Return to primary care physician as previously                        scheduled. Procedure Code(s):    --- Professional ---                       [phone], Colonoscopy, flexible; with biopsy, single or                        multiple Diagnosis Code(s):    --- Professional ---                       D12.0, Benign neoplasm of cecum                       D12.5, Benign neoplasm of sigmoid colon                       R93.3, Abnormal findings on diagnostic imaging of other                        parts of digestive tract CPT copyright 2017 American Medical Association. All rights reserved. The codes documented in this report are preliminary and upon coder review may  be revised to meet current compliance requirements.  Vonda Antigua, MD Margretta Sidle B.  Bonna Gains MD, MD 04/23/2018 10:04:37 AM This report has been signed electronically. Number of Addenda: 0 Note Initiated On: 04/23/2018 9:11 AM Scope Withdrawal Time: 0 hours 23 minutes 21 seconds  Total Procedure Duration: 0 hours 38 minutes 18 seconds  Estimated Blood Loss: Estimated blood loss: none.      [facility]

## 2018-04-23 NOTE — Anesthesia Preprocedure Evaluation (Addendum)
Anesthesia Evaluation  Patient identified by MRN, date of birth, ID band Patient awake    Reviewed: Allergy & Precautions, NPO status , Patient's Chart, lab work & pertinent test results  History of Anesthesia Complications Negative for: history of anesthetic complications  Airway Mallampati: I  TM Distance: >3 FB     Dental  (+) Dental Advidsory Given, Teeth Intact   Pulmonary neg pulmonary ROS, former smoker,           Cardiovascular hypertension, + angina (stable) + CAD, + Past MI and + CABG  (-) Cardiac Stents (-) dysrhythmias (-) Valvular Problems/Murmurs  History noted. 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 No date: Diverticulosis No date: Dysrhythmia No date: ED (erectile dysfunction) No date: GERD (gastroesophageal reflux disease) No date: Hemorrhoids No date: Hypertension No date: Weight loss     Comment:  Abnormal   Reproductive/Obstetrics negative OB ROS                           Anesthesia Physical  Anesthesia Plan  ASA: III  Anesthesia Plan: General   Post-op Pain Management:    Induction: Intravenous  PONV Risk Score and Plan: 2 and Propofol infusion and TIVA  Airway Management Planned: Nasal Cannula  Additional Equipment:   Intra-op Plan:   Post-operative Plan:   Informed Consent: I have reviewed the patients History and Physical, chart, labs and discussed the procedure including the risks, benefits and alternatives for the proposed anesthesia with the patient or authorized representative who has indicated his/her understanding and acceptance.   Dental advisory  given  Plan Discussed with: CRNA and Anesthesiologist  Anesthesia Plan Comments:        Anesthesia Quick Evaluation

## 2018-04-23 NOTE — Anesthesia Post-op Follow-up Note (Signed)
Anesthesia QCDR form completed.        

## 2018-04-23 NOTE — Anesthesia Postprocedure Evaluation (Signed)
Anesthesia Post Note  Patient: Jeffrey Prince  Procedure(s) Performed: COLONOSCOPY WITH PROPOFOL (N/A )  Patient location during evaluation: Endoscopy Anesthesia Type: General Level of consciousness: awake and alert Pain management: pain level controlled Vital Signs Assessment: post-procedure vital signs reviewed and stable Respiratory status: spontaneous breathing, nonlabored ventilation, respiratory function stable and patient connected to nasal cannula oxygen Cardiovascular status: blood pressure returned to baseline and stable Postop Assessment: no apparent nausea or vomiting Anesthetic complications: no     Last Vitals:  Vitals:   04/23/18 1022 04/23/18 1032  BP: (!) 143/100 (!) 181/95  Pulse: (!) 47 (!) 41  Resp: 15 18  Temp:    SpO2: 97% 100%    Last Pain:  Vitals:   04/23/18 1032  TempSrc:   PainSc: 0-No pain                 Precious Haws Ronnie Doo

## 2018-04-23 NOTE — Transfer of Care (Signed)
Immediate Anesthesia Transfer of Care Note  Patient: TREVANTE TENNELL  Procedure(s) Performed: COLONOSCOPY WITH PROPOFOL (N/A )  Patient Location: PACU  Anesthesia Type:General  Level of Consciousness: sedated  Airway & Oxygen Therapy: Patient Spontanous Breathing and Patient connected to nasal cannula oxygen  Post-op Assessment: Report given to RN and Post -op Vital signs reviewed and stable  Post vital signs: Reviewed and stable  Last Vitals:  Vitals Value Taken Time  BP 102/64 04/23/2018 10:04 AM  Temp 36.1 C 04/23/2018 10:04 AM  Pulse 50 04/23/2018 10:04 AM  Resp 11 04/23/2018 10:04 AM  SpO2 100 % 04/23/2018 10:04 AM    Last Pain:  Vitals:   04/23/18 1002  TempSrc: Tympanic  PainSc: Asleep         Complications: No apparent anesthesia complications

## 2018-04-24 ENCOUNTER — Encounter: Payer: Self-pay | Admitting: Gastroenterology

## 2018-04-24 LAB — SURGICAL PATHOLOGY

## 2018-05-18 ENCOUNTER — Emergency Department
Admission: EM | Admit: 2018-05-18 | Discharge: 2018-05-18 | Disposition: A | Discharge status: Home / Self Care | Payer: BLUE CROSS/BLUE SHIELD | Attending: Emergency Medicine | Admitting: Emergency Medicine

## 2018-05-18 ENCOUNTER — Emergency Department: Payer: BLUE CROSS/BLUE SHIELD

## 2018-05-18 ENCOUNTER — Encounter: Payer: Self-pay | Admitting: Emergency Medicine

## 2018-05-18 ENCOUNTER — Other Ambulatory Visit: Payer: Self-pay

## 2018-05-18 DIAGNOSIS — I2510 Atherosclerotic heart disease of native coronary artery without angina pectoris: Secondary | ICD-10-CM | POA: Insufficient documentation

## 2018-05-18 DIAGNOSIS — Z87891 Personal history of nicotine dependence: Secondary | ICD-10-CM | POA: Insufficient documentation

## 2018-05-18 DIAGNOSIS — R079 Chest pain, unspecified: Secondary | ICD-10-CM | POA: Diagnosis not present

## 2018-05-18 DIAGNOSIS — I10 Essential (primary) hypertension: Secondary | ICD-10-CM | POA: Insufficient documentation

## 2018-05-18 DIAGNOSIS — K859 Acute pancreatitis without necrosis or infection, unspecified: Secondary | ICD-10-CM | POA: Insufficient documentation

## 2018-05-18 DIAGNOSIS — R1013 Epigastric pain: Secondary | ICD-10-CM | POA: Diagnosis present

## 2018-05-18 DIAGNOSIS — R1011 Right upper quadrant pain: Secondary | ICD-10-CM | POA: Insufficient documentation

## 2018-05-18 DIAGNOSIS — K8500 Idiopathic acute pancreatitis without necrosis or infection: Secondary | ICD-10-CM

## 2018-05-18 LAB — BASIC METABOLIC PANEL
Anion gap: 6 (ref 5–15)
BUN: 8 mg/dL (ref 6–20)
CHLORIDE: 106 mmol/L (ref 98–111)
CO2: 28 mmol/L (ref 22–32)
Calcium: 8.8 mg/dL — ABNORMAL LOW (ref 8.9–10.3)
Creatinine, Ser: 0.78 mg/dL (ref 0.61–1.24)
GFR calc Af Amer: >60 mL/min (ref >60)
GFR calc non Af Amer: >60 mL/min (ref >60)
Glucose, Bld: 85 mg/dL (ref 70–99)
POTASSIUM: 3.7 mmol/L (ref 3.5–5.1)
SODIUM: 140 mmol/L (ref 135–145)

## 2018-05-18 LAB — CBC
HEMATOCRIT: 42.2 % (ref 40.0–52.0)
Hemoglobin: 14.1 g/dL (ref 13.0–18.0)
MCH: 28.9 pg (ref 26.0–34.0)
MCHC: 33.5 g/dL (ref 32.0–36.0)
MCV: 86.1 fL (ref 80.0–100.0)
Platelets: 227 10*3/uL (ref 150–440)
RBC: 4.9 MIL/uL (ref 4.40–5.90)
RDW: 15.3 % — ABNORMAL HIGH (ref 11.5–14.5)
WBC: 3 10*3/uL — AB (ref 3.8–10.6)

## 2018-05-18 LAB — HEPATIC FUNCTION PANEL
ALBUMIN: 3.5 g/dL (ref 3.5–5.0)
ALK PHOS: 57 U/L (ref 38–126)
ALT: 16 U/L (ref 0–44)
AST: 18 U/L (ref 15–41)
BILIRUBIN INDIRECT: 0.5 mg/dL (ref 0.3–0.9)
Bilirubin, Direct: 0.1 mg/dL (ref 0.0–0.2)
TOTAL PROTEIN: 6.9 g/dL (ref 6.5–8.1)
Total Bilirubin: 0.6 mg/dL (ref 0.3–1.2)

## 2018-05-18 LAB — TROPONIN I
Troponin I: <0.03 ng/mL (ref <0.03)
Troponin I: <0.03 ng/mL (ref <0.03)

## 2018-05-18 LAB — LIPASE, BLOOD: Lipase: 133 U/L — ABNORMAL HIGH (ref 11–51)

## 2018-05-18 MED ORDER — OXYCODONE-ACETAMINOPHEN 5-325 MG PO TABS
1.0000 | ORAL_TABLET | Freq: Three times a day (TID) | ORAL | 0 refills | Status: DC | PRN
Start: 2018-05-18 — End: 2018-07-24

## 2018-05-18 MED ORDER — MORPHINE SULFATE (PF) 4 MG/ML IV SOLN
4.0000 mg | Freq: Once | INTRAVENOUS | Status: AC
  Administered 2018-05-18: 4 mg via INTRAVENOUS
  Filled 2018-05-18: qty 1

## 2018-05-18 NOTE — ED Provider Notes (Signed)
[facility] Emergency Department Provider Note       Time seen: ----------------------------------------- 7:55 AM on 05/18/2018 -----------------------------------------   I have reviewed the triage vital signs and the nursing notes.  HISTORY   Chief Complaint Abdominal Pain and Chest Pain    HPI Jeffrey Prince is a 52 y.o. male with a history of dysrhythmia, GERD, hypertension, coronary artery disease who presents to the ED for epigastric pain that radiates into his chest with shortness of breath, weakness, dizziness and diaphoresis.  Patient states the chest is sore, particular on the left side.  He has not seen his doctor for same.  He reports he had bypass surgery in January of this year.  He denies any recent illness or other complaints.  Past Medical History:  Diagnosis Date  . Acne   . Anginal pain ([place])   . Blind left eye    Since birth  . Chorea   . Diverticulosis   . Dysrhythmia   . ED (erectile dysfunction)   . GERD (gastroesophageal reflux disease)   . Hemorrhoids   . Hypertension   . Weight loss    Abnormal    Patient Active Problem List   Diagnosis Date Noted  . Benign neoplasm of cecum   . Polyp of sigmoid colon   . Abnormal computed tomography of large intestine   . NSTEMI (non-ST elevated myocardial infarction) ([place]) 11/19/2017  . Coronary artery disease 11/03/2017  . ACS (acute coronary syndrome) ([place]) 10/31/2017  . Chest pain 10/30/2017  . Anal fissure   . Family history of Huntington's chorea 07/01/2017  . Unintentional weight loss 05/16/2015  . Diarrhea 05/16/2015  . GERD (gastroesophageal reflux disease) 05/16/2015  . Diffuse abdominal pain 05/16/2015  . Dyslipidemia (high LDL; low HDL) 01/25/2015  . Decreased visual acuity 01/24/2015  . Erectile dysfunction 01/24/2015  . Current every day smoker 01/24/2015  . 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 Hemorrhoids 03/16/2014    Past Surgical History:   Procedure Laterality Date  . BOTOX INJECTION N/A 10/09/2016   Procedure: BOTOX INJECTION;  Surgeon: Jules Husbands, MD;  Location: [facility] ORS;  Service: General;  Laterality: N/A;  . BOTOX INJECTION N/A 07/23/2017   Procedure: BOTOX INJECTION;  Surgeon: Jules Husbands, MD;  Location: [facility] ORS;  Service: General;  Laterality: N/A;  . CARDIAC SURGERY    . COLONOSCOPY     with polyp removal  . COLONOSCOPY WITH PROPOFOL N/A 04/23/2018   Procedure: COLONOSCOPY WITH PROPOFOL;  Surgeon: Virgel Manifold, MD;  Location: [facility] ENDOSCOPY;  Service: Endoscopy;  Laterality: N/A;  . CORONARY ARTERY BYPASS GRAFT N/A 11/03/2017   Procedure: CORONARY ARTERY BYPASS GRAFTING (CABG) x four, using left internal mammry artery and right leg greater saphenous vein harvested endoscopically (LIMA to LAD, SVG to OM1, SVG to RAMUS INTERMEDIATE, SVG to PDA)  ;  Surgeon: Grace Isaac, MD;  Location: [place];  Service: Open Heart Surgery;  Laterality: N/A;  . EVALUATION UNDER ANESTHESIA WITH ANAL FISSUROTOMY N/A 10/09/2016   Procedure: EXAM UNDER ANESTHESIA WITH ANAL FISSUROTOMY chemical sphincterotomy w BOtox.;  Surgeon: Jules Husbands, MD;  Location: [facility] ORS;  Service: General;  Laterality: N/A;  Lithotomy, make sure botox is in the room  . EVALUATION UNDER ANESTHESIA WITH ANAL FISSUROTOMY N/A 07/23/2017   Procedure: EXAM UNDER ANESTHESIA WITH ANAL FISSUROTOMY, botox injection;  Surgeon: Jules Husbands, MD;  Location: [facility] ORS;  Service: General;  Laterality: N/A;  . HEMORRHOID SURGERY    .  LEFT HEART CATH AND CORONARY ANGIOGRAPHY Right 10/30/2017   Procedure: LEFT HEART CATH AND CORONARY ANGIOGRAPHY;  Surgeon: Dionisio David, MD;  Location: [place] [facility];  Service: Cardiovascular;  Laterality: Right;  . LEFT HEART CATH AND CORONARY ANGIOGRAPHY Right 11/20/2017   Procedure: LEFT HEART CATH AND CORONARY ANGIOGRAPHY;  Surgeon: Dionisio David, MD;  Location: [place] [facility];  Service: Cardiovascular;  Laterality:  Right;  . ROTATOR CUFF REPAIR  1991  . TEE WITHOUT CARDIOVERSION N/A 11/03/2017   Procedure: TRANSESOPHAGEAL ECHOCARDIOGRAM (TEE);  Surgeon: Grace Isaac, MD;  Location: [place];  Service: Open Heart Surgery;  Laterality: N/A;    Allergies Bee venom; Codeine; and Sulfa antibiotics  Social History Social History   Tobacco Use  . Smoking status: Former Smoker    Packs/day: 0.50    Years: 20.00    Pack years: 10.00    Types: Cigarettes    Last attempt to quit: 06/16/2017    Years since quitting: 0.9  . Smokeless tobacco: Never Used  Substance Use Topics  . Alcohol use: No    Alcohol/week: 0.0 oz  . Drug use: No   Review of Systems Constitutional: Negative for fever. Cardiovascular: Positive for chest pain Respiratory: Negative for shortness of breath. Gastrointestinal: Negative for abdominal pain, vomiting and diarrhea. Genitourinary: Negative for dysuria. Musculoskeletal: Negative for back pain. Skin: Positive for diaphoresis Neurological: Negative for headaches, positive for weakness and dizziness  All systems negative/normal/unremarkable except as stated in the HPI  ____________________________________________   PHYSICAL EXAM:  VITAL SIGNS: ED Triage Vitals  Enc Vitals Group     BP 05/18/18 0747 134/86     Pulse Rate 05/18/18 0747 (!) 52     Resp 05/18/18 0747 16     Temp 05/18/18 0747 98.5 F (36.9 C)     Temp Source 05/18/18 0747 Oral     SpO2 05/18/18 0747 100 %     Weight 05/18/18 0747 130 lb (59 kg)     Height 05/18/18 0747 5\' 10"  (1.778 m)     Head Circumference --      Peak Flow --      Pain Score 05/18/18 0752 7     Pain Loc --      Pain Edu? --      Excl. in [place]? --    Constitutional: Alert and oriented. Well appearing and in no distress. Eyes: Conjunctivae are normal. Normal extraocular movements. ENT   Head: Normocephalic and atraumatic.   Nose: No congestion/rhinnorhea.   Mouth/Throat: Mucous membranes are moist.   Neck: No  stridor. Cardiovascular: Normal rate, regular rhythm. No murmurs, rubs, or gallops. Respiratory: Normal respiratory effort without tachypnea nor retractions. Breath sounds are clear and equal bilaterally. No wheezes/rales/rhonchi. Gastrointestinal: Soft and nontender. Normal bowel sounds Musculoskeletal: Nontender with normal range of motion in extremities. No lower extremity tenderness nor edema.  Significant left chest wall tenderness is noted Neurologic:  Normal speech and language. No gross focal neurologic deficits are appreciated.  Choreiform movement is noted Skin:  Skin is warm, dry and intact. No rash noted. Psychiatric: Mood and affect are normal. Speech and behavior are normal.  ____________________________________________  EKG: Interpreted by me.  Sinus bradycardia with rate of 52 bpm, normal PR interval, normal QRS, normal QT  ____________________________________________  ED COURSE:  As part of my medical decision making, I reviewed the following data within the [place] History obtained from family if available, nursing notes, old chart and ekg,  as well as notes from prior ED visits. Patient presented for chest pain with weakness and dizziness, we will assess with labs and imaging as indicated at this time.   Procedures ____________________________________________   LABS (pertinent positives/negatives)  Labs Reviewed  BASIC METABOLIC PANEL - Abnormal; Notable for the following components:      Result Value   Calcium 8.8 (*)    All other components within normal limits  CBC - Abnormal; Notable for the following components:   WBC 3.0 (*)    RDW 15.3 (*)    All other components within normal limits  LIPASE, BLOOD - Abnormal; Notable for the following components:   Lipase 133 (*)    All other components within normal limits  TROPONIN I  HEPATIC FUNCTION PANEL  TROPONIN I    RADIOLOGY  Acute abdominal series IMPRESSION: COPD.  No evidence of  bowel obstruction or free air. RUQ US IMPRESSION: Study within normal limits.  ____________________________________________  DIFFERENTIAL DIAGNOSIS   Musculoskeletal pain, GERD, MI, PE, pneumothorax  FINAL ASSESSMENT AND PLAN  Pancreatitis   Plan: The patient had presented for chest pain and weakness. Patient's labs did reveal elevated lipase suggestive of pancreatitis but no other acute process. Patient's imaging was unremarkable including acute abdominal series and right upper quadrant ultrasound.  He will be discharged with pain medicine and will be referred to GI for close outpatient follow-up.   Laurence Aly, MD   Note: This note was generated in part or whole with voice recognition software. Voice recognition is usually quite accurate but there are transcription errors that can and very often do occur. I apologize for any typographical errors that were not detected and corrected.     Earleen Newport, MD 05/18/18 1100

## 2018-05-18 NOTE — ED Triage Notes (Signed)
Pt to ed with c/o constant epigastric pain that radiates into his central chest area. +sob, +weakness, +dizziness, +diaphoresis.  Pt states PMD dr. Humphrey Rolls and has not been seen there for same.

## 2018-05-18 NOTE — ED Notes (Signed)
Pt using restroom, having a BM.

## 2018-05-21 ENCOUNTER — Ambulatory Visit: Payer: Self-pay | Admitting: General Surgery

## 2018-05-22 ENCOUNTER — Encounter: Payer: Self-pay | Admitting: Emergency Medicine

## 2018-05-22 ENCOUNTER — Other Ambulatory Visit: Payer: Self-pay

## 2018-05-22 ENCOUNTER — Emergency Department
Admission: EM | Admit: 2018-05-22 | Discharge: 2018-05-22 | Disposition: A | Discharge status: Home / Self Care | Payer: BLUE CROSS/BLUE SHIELD | Attending: Emergency Medicine | Admitting: Emergency Medicine

## 2018-05-22 ENCOUNTER — Emergency Department: Payer: BLUE CROSS/BLUE SHIELD

## 2018-05-22 DIAGNOSIS — R1013 Epigastric pain: Secondary | ICD-10-CM | POA: Diagnosis not present

## 2018-05-22 DIAGNOSIS — R079 Chest pain, unspecified: Secondary | ICD-10-CM | POA: Diagnosis present

## 2018-05-22 DIAGNOSIS — Z87891 Personal history of nicotine dependence: Secondary | ICD-10-CM | POA: Diagnosis not present

## 2018-05-22 DIAGNOSIS — Z79899 Other long term (current) drug therapy: Secondary | ICD-10-CM | POA: Diagnosis not present

## 2018-05-22 DIAGNOSIS — I10 Essential (primary) hypertension: Secondary | ICD-10-CM | POA: Insufficient documentation

## 2018-05-22 DIAGNOSIS — Z951 Presence of aortocoronary bypass graft: Secondary | ICD-10-CM | POA: Diagnosis not present

## 2018-05-22 DIAGNOSIS — I252 Old myocardial infarction: Secondary | ICD-10-CM | POA: Diagnosis not present

## 2018-05-22 LAB — HEPATIC FUNCTION PANEL
ALT: 17 U/L (ref 0–44)
AST: 22 U/L (ref 15–41)
Albumin: 3.7 g/dL (ref 3.5–5.0)
Alkaline Phosphatase: 58 U/L (ref 38–126)
BILIRUBIN DIRECT: 0.2 mg/dL (ref 0.0–0.2)
BILIRUBIN INDIRECT: 0.5 mg/dL (ref 0.3–0.9)
BILIRUBIN TOTAL: 0.7 mg/dL (ref 0.3–1.2)
Total Protein: 7.6 g/dL (ref 6.5–8.1)

## 2018-05-22 LAB — BASIC METABOLIC PANEL
ANION GAP: 7 (ref 5–15)
BUN: 10 mg/dL (ref 6–20)
CALCIUM: 9 mg/dL (ref 8.9–10.3)
CO2: 27 mmol/L (ref 22–32)
Chloride: 105 mmol/L (ref 98–111)
Creatinine, Ser: 0.72 mg/dL (ref 0.61–1.24)
GFR calc Af Amer: >60 mL/min (ref >60)
GLUCOSE: 85 mg/dL (ref 70–99)
Potassium: 3.4 mmol/L — ABNORMAL LOW (ref 3.5–5.1)
Sodium: 139 mmol/L (ref 135–145)

## 2018-05-22 LAB — TROPONIN I

## 2018-05-22 LAB — LIPASE, BLOOD: Lipase: 35 U/L (ref 11–51)

## 2018-05-22 LAB — CBC
HCT: 39.8 % — ABNORMAL LOW (ref 40.0–52.0)
Hemoglobin: 13.8 g/dL (ref 13.0–18.0)
MCH: 29.6 pg (ref 26.0–34.0)
MCHC: 34.6 g/dL (ref 32.0–36.0)
MCV: 85.6 fL (ref 80.0–100.0)
PLATELETS: 217 10*3/uL (ref 150–440)
RBC: 4.65 MIL/uL (ref 4.40–5.90)
RDW: 15 % — ABNORMAL HIGH (ref 11.5–14.5)
WBC: 4.1 10*3/uL (ref 3.8–10.6)

## 2018-05-22 MED ORDER — OXYCODONE HCL 5 MG PO TABS
5.0000 mg | ORAL_TABLET | Freq: Once | ORAL | Status: AC
  Administered 2018-05-22: 5 mg via ORAL
  Filled 2018-05-22: qty 1

## 2018-05-22 MED ORDER — ACETAMINOPHEN 500 MG PO TABS
1000.0000 mg | ORAL_TABLET | Freq: Once | ORAL | Status: AC
  Administered 2018-05-22: 1000 mg via ORAL
  Filled 2018-05-22: qty 2

## 2018-05-22 NOTE — ED Notes (Signed)
Pt states he is here for "nodules" on left chest wall under skin. Pt states he was here " a couple of days ago" for same and given a prescription for percocet that is helping pain. When asked about chest pain all pt eludes to is "it's been there off and on" pt provides no further information regarding chest pain.

## 2018-05-22 NOTE — ED Triage Notes (Addendum)
Pt arrived via POV with reports of chest pain that started today while sitting. Pt states the chest pain is on the left side radiating to left arm and to back.  Pt reports nausea, shortness of breath. Denies dizziness or lightheadedness.   Pt is former smoker.Pt also reports he has nodules on the left side of his chest as well.

## 2018-05-22 NOTE — Discharge Instructions (Signed)

## 2018-05-22 NOTE — ED Provider Notes (Signed)
[facility] Emergency Department Provider Note  ____________________________________________  Time seen: Approximately 7:41 PM  I have reviewed the triage vital signs and the nursing notes.   HISTORY  Chief Complaint Chest Pain  Level 5 caveat:  Portions of the history and physical were unable to be obtained due to poor historian   HPI Jeffrey Prince is a 52 y.o. male with a history of CAD status post CABG, diverticulosis, GERD, hypertension who presents for evaluation of abdominal pain.  Patient was seen here 5 days ago and diagnosed with acute pancreatitis.  He is complaining of the same pain that is located in the epigastric region, sharp, 7 out of 10, radiating to his back and up to the left side of his chest.  Patient keeps complaining of bowel nodules in his abdomen but there are no nodules that he can show me.  He is unable to explain what he means by that.  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  He has had no nausea or vomiting, no fever or chills.  He reports that the Percocet is helping.  He has not been following the bland diet recommendations by Dr. Jimmye Norman for his recently diagnosed pancreatitis.  He denies drinking alcohol over the last 2 years.  He has no shortness of breath.  Past Medical History:  Diagnosis Date  . Acne   . Anginal pain ([place])   . Blind left eye    Since birth  . Chorea   . Diverticulosis   . Dysrhythmia   . ED (erectile dysfunction)   . GERD (gastroesophageal reflux disease)   . Hemorrhoids   . Hypertension   . Weight loss    Abnormal    Patient Active Problem List   Diagnosis Date Noted  . Benign neoplasm of cecum   . Polyp of sigmoid colon   . Abnormal computed tomography of large intestine   . NSTEMI (non-ST elevated myocardial infarction) ([place]) 11/19/2017  . Coronary artery disease 11/03/2017  . ACS (acute coronary syndrome) ([place]) 10/31/2017  . Chest pain  10/30/2017  . Anal fissure   . Family history of Huntington's chorea 07/01/2017  . Unintentional weight loss 05/16/2015  . Diarrhea 05/16/2015  . GERD (gastroesophageal reflux disease) 05/16/2015  . Diffuse abdominal pain 05/16/2015  . Dyslipidemia (high LDL; low HDL) 01/25/2015  . Decreased visual acuity 01/24/2015  . Erectile dysfunction 01/24/2015  . Current every day smoker 01/24/2015  . 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 Hemorrhoids 03/16/2014    Past Surgical History:  Procedure Laterality Date  . BOTOX INJECTION N/A 10/09/2016   Procedure: BOTOX INJECTION;  Surgeon: Jules Husbands, MD;  Location: [facility] ORS;  Service: General;  Laterality: N/A;  . BOTOX INJECTION N/A 07/23/2017   Procedure: BOTOX INJECTION;  Surgeon: Jules Husbands, MD;  Location: [facility] ORS;  Service: General;  Laterality: N/A;  . CARDIAC SURGERY    . COLONOSCOPY     with polyp removal  . COLONOSCOPY WITH PROPOFOL N/A 04/23/2018   Procedure: COLONOSCOPY WITH PROPOFOL;  Surgeon: Virgel Manifold, MD;  Location: [facility] ENDOSCOPY;  Service: Endoscopy;  Laterality: N/A;  . CORONARY ARTERY BYPASS GRAFT N/A 11/03/2017   Procedure: CORONARY ARTERY BYPASS GRAFTING (CABG) x four, using left internal mammry artery and right leg greater saphenous vein harvested endoscopically (LIMA to LAD, SVG to OM1, SVG to RAMUS INTERMEDIATE, SVG to PDA)  ;  Surgeon: Grace Isaac, MD;  Location:  [place] [facility];  Service: Open Heart Surgery;  Laterality: N/A;  . EVALUATION UNDER ANESTHESIA WITH ANAL FISSUROTOMY N/A 10/09/2016   Procedure: EXAM UNDER ANESTHESIA WITH ANAL FISSUROTOMY chemical sphincterotomy w BOtox.;  Surgeon: Jules Husbands, MD;  Location: [facility] ORS;  Service: General;  Laterality: N/A;  Lithotomy, make sure botox is in the room  . EVALUATION UNDER ANESTHESIA WITH ANAL FISSUROTOMY N/A 07/23/2017   Procedure: EXAM UNDER ANESTHESIA WITH ANAL FISSUROTOMY, botox injection;  Surgeon: Jules Husbands, MD;  Location: [facility] ORS;   Service: General;  Laterality: N/A;  . HEMORRHOID SURGERY    . LEFT HEART CATH AND CORONARY ANGIOGRAPHY Right 10/30/2017   Procedure: LEFT HEART CATH AND CORONARY ANGIOGRAPHY;  Surgeon: Dionisio David, MD;  Location: [place] [facility];  Service: Cardiovascular;  Laterality: Right;  . LEFT HEART CATH AND CORONARY ANGIOGRAPHY Right 11/20/2017   Procedure: LEFT HEART CATH AND CORONARY ANGIOGRAPHY;  Surgeon: Dionisio David, MD;  Location: [place] [facility];  Service: Cardiovascular;  Laterality: Right;  . ROTATOR CUFF REPAIR  1991  . TEE WITHOUT CARDIOVERSION N/A 11/03/2017   Procedure: TRANSESOPHAGEAL ECHOCARDIOGRAM (TEE);  Surgeon: Grace Isaac, MD;  Location: [facility];  Service: Open Heart Surgery;  Laterality: N/A;    Prior to Admission medications   Medication Sig Start Date End Date Taking? Authorizing Provider  aspirin EC 325 MG EC tablet Take 1 tablet (325 mg total) by mouth daily. Patient not taking: Reported on 04/10/2018 11/08/17   Elgie Collard, PA-C  atorvastatin (LIPITOR) 40 MG tablet Take 1 tablet (40 mg total) by mouth daily. Patient not taking: Reported on 04/10/2018 11/08/17   Elgie Collard, PA-C  diphenoxylate-atropine (LOMOTIL) 2.5-0.025 MG tablet Take 1 tablet by mouth 4 (four) times daily as needed for diarrhea or loose stools. Patient not taking: Reported on 04/10/2018 03/16/18 03/16/19  Orbie Pyo, MD  furosemide (LASIX) 40 MG tablet Take 1 tablet (40 mg total) by mouth daily for 5 days. Patient not taking: Reported on 11/19/2017 11/08/17 11/13/17  Elgie Collard, PA-C  hydrocortisone (PROCTOZONE-HC) 2.5 % rectal cream  10/30/16   [provider]  hyoscyamine (ANASPAZ) 0.125 MG TBDP disintergrating tablet Place 0.125 mg under the tongue every 4 (four) hours as needed for cramping.     [provider]  isosorbide mononitrate (IMDUR) 30 MG 24 hr tablet Take 1 tablet (30 mg total) by mouth daily. 11/20/17   Henreitta Leber, MD  lidocaine (LIDODERM)  5 % Place 1 patch onto the skin every 12 (twelve) hours. Remove & Discard patch within 12 hours or as directed by MD Patient not taking: Reported on 04/23/2018 04/03/18 04/03/19  Merlyn Lot, MD  metoprolol tartrate (LOPRESSOR) 25 MG tablet Take 0.5 tablets (12.5 mg total) by mouth 2 (two) times daily. Patient not taking: Reported on 04/23/2018 11/08/17   Elgie Collard, PA-C  naproxen (NAPROSYN) 500 MG tablet Take 1 tablet (500 mg total) by mouth 2 (two) times daily with a meal. Patient not taking: Reported on 04/23/2018 12/26/17   Lavonia Drafts, MD  oxyCODONE-acetaminophen (PERCOCET) 5-325 MG tablet Take 1 tablet by mouth every 8 (eight) hours as needed. 05/18/18   Earleen Newport, MD  polyethylene glycol-electrolytes (NULYTELY/GOLYTELY) 420 g solution As directed 04/10/18   Virgel Manifold, MD  potassium chloride SA (K-DUR,KLOR-CON) 20 MEQ tablet Take 1 tablet (20 mEq total) by mouth daily for 5 days. 11/08/17 11/19/17  Elgie Collard, PA-C  REPATHA 140 MG/ML SOSY  02/16/18   [provider]  simethicone (MYLICON) 80 MG chewable tablet Chew 1 tablet (80 mg total) by mouth 4 (four) times daily as needed for flatulence. Patient not taking: Reported on 04/10/2018 11/08/17   Elgie Collard, PA-C  traMADol (ULTRAM) 50 MG tablet Take 1 tablet (50 mg total) by mouth every 6 (six) hours as needed. 04/03/18 04/03/19  Merlyn Lot, MD    Allergies Bee venom; Codeine; and Sulfa antibiotics  Family History  Problem Relation Age of Onset  . Breast cancer Mother   . Huntington's disease Father   . Cancer Paternal Uncle        Colon  . Diabetes Son   . Cancer Maternal Grandfather   . Cancer Paternal Grandmother   . Heart disease Paternal Grandmother   . Huntington's disease Paternal Grandmother     Social History Social History   Tobacco Use  . Smoking status: Former Smoker    Packs/day: 0.50    Years: 20.00    Pack years: 10.00    Types: Cigarettes    Last attempt to quit:  06/16/2017    Years since quitting: 0.9  . Smokeless tobacco: Never Used  Substance Use Topics  . Alcohol use: No    Alcohol/week: 0.0 oz  . Drug use: No    Review of Systems  Constitutional: Negative for fever. Eyes: Negative for visual changes. ENT: Negative for sore throat. Neck: No neck pain  Cardiovascular: + L sided chest pain. Respiratory: Negative for shortness of breath. Gastrointestinal: + upper abdominal pain. No vomiting or diarrhea. Genitourinary: Negative for dysuria. Musculoskeletal: Negative for back pain. Skin: Negative for rash. Neurological: Negative for headaches, weakness or numbness. Psych: No SI or HI  ____________________________________________   PHYSICAL EXAM:  VITAL SIGNS: ED Triage Vitals  Enc Vitals Group     BP 05/22/18 1842 (!) 152/91     Pulse Rate 05/22/18 1842 (!) 57     Resp 05/22/18 1842 16     Temp 05/22/18 1842 98.3 F (36.8 C)     Temp Source 05/22/18 1842 Oral     SpO2 05/22/18 1842 100 %     Weight 05/22/18 1843 130 lb (59 kg)     Height 05/22/18 1843 5\' 10"  (1.778 m)     Head Circumference --      Peak Flow --      Pain Score 05/22/18 1847 7     Pain Loc --      Pain Edu? --      Excl. in [place]? --     Constitutional: Alert and oriented, patient is in no distress.  HEENT:      Head: Normocephalic and atraumatic.         Eyes: Conjunctivae are normal. Sclera is non-icteric.       Mouth/Throat: Mucous membranes are moist.       Neck: Supple with no signs of meningismus. Cardiovascular: Regular rate and rhythm. No murmurs, gallops, or rubs. 2+ symmetrical distal pulses are present in all extremities. No JVD. Respiratory: Normal respiratory effort. Lungs are clear to auscultation bilaterally. No wheezes, crackles, or rhonchi.  Gastrointestinal: Soft, epigastric and LUQ ttp, non distended with positive bowel sounds. No rebound or guarding. Musculoskeletal: Nontender with normal range of motion in all extremities. No edema,  cyanosis, or erythema of extremities. Neurologic: Normal speech and language. Face is symmetric. Moving all extremities. No gross focal neurologic deficits are appreciated. Skin: Skin is warm, dry and intact. No rash noted.  Psychiatric: Mood and affect are normal. Speech and behavior are normal.  ____________________________________________   LABS (all labs ordered are listed, but only abnormal results are displayed)  Labs Reviewed  BASIC METABOLIC PANEL - Abnormal; Notable for the following components:      Result Value   Potassium 3.4 (*)    All other components within normal limits  CBC - Abnormal; Notable for the following components:   HCT 39.8 (*)    RDW 15.0 (*)    All other components within normal limits  TROPONIN I  LIPASE, BLOOD  HEPATIC FUNCTION PANEL  TROPONIN I   ____________________________________________  EKG  ED ECG REPORT I, Rudene Re, the attending physician, personally viewed and interpreted this ECG.  Sinus bradycardia, rate of 53, normal intervals, normal axis, no ST elevations or depression. Unchanged from prior ____________________________________________  RADIOLOGY  I have personally reviewed the images performed during this visit and I agree with the Radiologist's read.   Interpretation by Radiologist:  Dg Chest 2 View  Result Date: 05/22/2018 CLINICAL DATA:  Chest pain. EXAM: CHEST - 2 VIEW COMPARISON:  None. FINDINGS: The heart size and mediastinal contours are within normal limits. Both lungs are clear. Hyperinflation consistent with COPD. The visualized skeletal structures are unremarkable.Median sternotomy for CABG. LEFT shoulder surgery. Old rib fractures. IMPRESSION: No active cardiopulmonary disease.  COPD.  Stable chest. Electronically Signed   By: Staci Righter M.D.   On: 05/22/2018 19:12   ____________________________________________   PROCEDURES  Procedure(s) performed: None Procedures Critical Care performed:   None ____________________________________________   INITIAL IMPRESSION / ASSESSMENT AND PLAN / ED COURSE  52 y.o. male with a history of CAD status post CABG, diverticulosis, GERD, hypertension who presents for evaluation of epigastric abdominal pain radiating to his back and up his left chest.  Patient reports that this pain is the same that brought him to the emergency room 4 days ago when he was diagnosed with pancreatitis.  Patient is a poor historian and keeps rubbing the left side of his chest and his abdomen asking me what the nodules on his abdomen are.  There are no nodules seen on exam were palpable.  I asked patient to point me to one of these nodules and is unable to do so and then he says "why am I hurting then if there are no nodules". I try to explain to him that he is hurting because he was diagnosed with pancreatitis and he is not taking the pain medications and following the bland diet instructions they were given to him 4 days ago.  He continues to have mild epigastric tenderness with no acute abdomen on exam.  His vitals are within normal limits.  His EKG shows no evidence of ischemia or dysrhythmias.  His first troponin is negative. Will repeat lipase and LFTs. Patient had negative RUQ [place] 4 days ago with no evidence of gallstone pancreatitis.  Is not a drinker.  Pancreatitis is idiopathic at this time.  Will give Tylenol and oxycodone for pain control which seems to be helping at home.  Low suspicion for ACS with continuous symptoms for several days, negative EKG and first troponin negative as well.     _________________________ 11:17 PM on 05/22/2018 -----------------------------------------  Work-up for patient was unremarkable with troponin x2-, chest x-ray negative, abdominal labs now showing normalization of lipase with no signs of sepsis.  Again discussed with the patient the importance of bland diet and pain control at home.  Also discussed close follow-up  with primary care  doctor.  Discussed return precautions with patient.   As part of my medical decision making, I reviewed the following data within the [place] notes reviewed and incorporated, Labs reviewed , EKG interpreted , Old EKG reviewed, Old chart reviewed, Radiograph reviewed , Notes from prior ED visits and [place] Controlled Substance Database    Pertinent labs & imaging results that were available during my care of the patient were reviewed by me and considered in my medical decision making (see chart for details).    ____________________________________________   FINAL CLINICAL IMPRESSION(S) / ED DIAGNOSES  Final diagnoses:  Epigastric abdominal pain      NEW MEDICATIONS STARTED DURING THIS VISIT:  ED Discharge Orders    None       Note:  This document was prepared using Dragon voice recognition software and may include unintentional dictation errors.    Alfred Levins, [place], MD 05/22/18 [phone]

## 2018-05-22 NOTE — ED Notes (Signed)
Pt has removed monitoring equipment

## 2018-05-25 ENCOUNTER — Other Ambulatory Visit: Payer: Self-pay

## 2018-05-25 ENCOUNTER — Emergency Department
Admission: EM | Admit: 2018-05-25 | Discharge: 2018-05-25 | Disposition: A | Discharge status: Home / Self Care | Payer: BLUE CROSS/BLUE SHIELD | Attending: Emergency Medicine | Admitting: Emergency Medicine

## 2018-05-25 ENCOUNTER — Encounter: Payer: Self-pay | Admitting: *Deleted

## 2018-05-25 DIAGNOSIS — I10 Essential (primary) hypertension: Secondary | ICD-10-CM | POA: Insufficient documentation

## 2018-05-25 DIAGNOSIS — Z79899 Other long term (current) drug therapy: Secondary | ICD-10-CM | POA: Insufficient documentation

## 2018-05-25 DIAGNOSIS — Z951 Presence of aortocoronary bypass graft: Secondary | ICD-10-CM | POA: Diagnosis not present

## 2018-05-25 DIAGNOSIS — Z7982 Long term (current) use of aspirin: Secondary | ICD-10-CM | POA: Insufficient documentation

## 2018-05-25 DIAGNOSIS — Z87891 Personal history of nicotine dependence: Secondary | ICD-10-CM | POA: Diagnosis not present

## 2018-05-25 DIAGNOSIS — L989 Disorder of the skin and subcutaneous tissue, unspecified: Secondary | ICD-10-CM | POA: Diagnosis not present

## 2018-05-25 DIAGNOSIS — I2510 Atherosclerotic heart disease of native coronary artery without angina pectoris: Secondary | ICD-10-CM | POA: Diagnosis not present

## 2018-05-25 DIAGNOSIS — R21 Rash and other nonspecific skin eruption: Secondary | ICD-10-CM | POA: Diagnosis present

## 2018-05-25 DIAGNOSIS — R238 Other skin changes: Secondary | ICD-10-CM

## 2018-05-25 LAB — URINALYSIS, COMPLETE (UACMP) WITH MICROSCOPIC
Bacteria, UA: NONE SEEN
Bilirubin Urine: NEGATIVE
GLUCOSE, UA: NEGATIVE mg/dL
KETONES UR: NEGATIVE mg/dL
LEUKOCYTES UA: NEGATIVE
NITRITE: NEGATIVE
PH: 5 (ref 5.0–8.0)
PROTEIN: NEGATIVE mg/dL
Specific Gravity, Urine: 1.014 (ref 1.005–1.030)
Squamous Epithelial / LPF: NONE SEEN (ref 0–5)

## 2018-05-25 LAB — CBC
HCT: 41.1 % (ref 40.0–52.0)
HEMOGLOBIN: 14.3 g/dL (ref 13.0–18.0)
MCH: 29.4 pg (ref 26.0–34.0)
MCHC: 34.7 g/dL (ref 32.0–36.0)
MCV: 85 fL (ref 80.0–100.0)
PLATELETS: 232 10*3/uL (ref 150–440)
RBC: 4.84 MIL/uL (ref 4.40–5.90)
RDW: 15.2 % — ABNORMAL HIGH (ref 11.5–14.5)
WBC: 3.1 10*3/uL — AB (ref 3.8–10.6)

## 2018-05-25 LAB — COMPREHENSIVE METABOLIC PANEL
ALK PHOS: 55 U/L (ref 38–126)
ALT: 15 U/L (ref 0–44)
AST: 18 U/L (ref 15–41)
Albumin: 3.8 g/dL (ref 3.5–5.0)
Anion gap: 5 (ref 5–15)
BUN: 10 mg/dL (ref 6–20)
CALCIUM: 9 mg/dL (ref 8.9–10.3)
CO2: 29 mmol/L (ref 22–32)
CREATININE: 0.78 mg/dL (ref 0.61–1.24)
Chloride: 109 mmol/L (ref 98–111)
Glucose, Bld: 98 mg/dL (ref 70–99)
Potassium: 3.8 mmol/L (ref 3.5–5.1)
SODIUM: 143 mmol/L (ref 135–145)
Total Bilirubin: 0.7 mg/dL (ref 0.3–1.2)
Total Protein: 7.8 g/dL (ref 6.5–8.1)

## 2018-05-25 LAB — LIPASE, BLOOD: Lipase: 35 U/L (ref 11–51)

## 2018-05-25 MED ORDER — TRIAMCINOLONE ACETONIDE 0.5 % EX OINT: 1.0000 "application " | TOPICAL_OINTMENT | Freq: Two times a day (BID) | CUTANEOUS | 0 refills | Status: AC

## 2018-05-25 NOTE — ED Provider Notes (Signed)
[facility] Emergency Department Provider Note  ____________________________________________  Time seen: Approximately 3:05 PM  I have reviewed the triage vital signs and the nursing notes.   HISTORY  Chief Complaint Abdominal Pain   HPI Jeffrey Prince is a 52 y.o. male who presents for evaluation of a skin irritation and rash.  Patient is complaining of pain and burning sensation of the skin in his mid abdomen which has been present intermittently for the last 3 weeks.  He has noted some nodules on the skin and has been picking on them. He reports that the nodules are also painful. No redness, no fever, no intra-abdominal pain, dysuria, hematuria, diarrhea, constipation.   Past Medical History:  Diagnosis Date  . Acne   . Anginal pain ([place])   . Blind left eye    Since birth  . Chorea   . Diverticulosis   . Dysrhythmia   . ED (erectile dysfunction)   . GERD (gastroesophageal reflux disease)   . Hemorrhoids   . Hypertension   . Weight loss    Abnormal    Patient Active Problem List   Diagnosis Date Noted  . Benign neoplasm of cecum   . Polyp of sigmoid colon   . Abnormal computed tomography of large intestine   . NSTEMI (non-ST elevated myocardial infarction) ([place]) 11/19/2017  . Coronary artery disease 11/03/2017  . ACS (acute coronary syndrome) ([place]) 10/31/2017  . Chest pain 10/30/2017  . Anal fissure   . Family history of Huntington's chorea 07/01/2017  . Unintentional weight loss 05/16/2015  . Diarrhea 05/16/2015  . GERD (gastroesophageal reflux disease) 05/16/2015  . Diffuse abdominal pain 05/16/2015  . Dyslipidemia (high LDL; low HDL) 01/25/2015  . Decreased visual acuity 01/24/2015  . Erectile dysfunction 01/24/2015  . Current every day smoker 01/24/2015  . 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 Hemorrhoids 03/16/2014    Past Surgical History:  Procedure Laterality Date  . BOTOX INJECTION N/A 10/09/2016   Procedure:  BOTOX INJECTION;  Surgeon: Jules Husbands, MD;  Location: [facility];  Service: General;  Laterality: N/A;  . BOTOX INJECTION N/A 07/23/2017   Procedure: BOTOX INJECTION;  Surgeon: Jules Husbands, MD;  Location: [facility];  Service: General;  Laterality: N/A;  . CARDIAC SURGERY    . COLONOSCOPY     with polyp removal  . COLONOSCOPY WITH PROPOFOL N/A 04/23/2018   Procedure: COLONOSCOPY WITH PROPOFOL;  Surgeon: Virgel Manifold, MD;  Location: [facility];  Service: Endoscopy;  Laterality: N/A;  . CORONARY ARTERY BYPASS GRAFT N/A 11/03/2017   Procedure: CORONARY ARTERY BYPASS GRAFTING (CABG) x four, using left internal mammry artery and right leg greater saphenous vein harvested endoscopically (LIMA to LAD, SVG to OM1, SVG to RAMUS INTERMEDIATE, SVG to PDA)  ;  Surgeon: Grace Isaac, MD;  Location: [place];  Service: Open Heart Surgery;  Laterality: N/A;  . EVALUATION UNDER ANESTHESIA WITH ANAL FISSUROTOMY N/A 10/09/2016   Procedure: EXAM UNDER ANESTHESIA WITH ANAL FISSUROTOMY chemical sphincterotomy w BOtox.;  Surgeon: Jules Husbands, MD;  Location: [facility];  Service: General;  Laterality: N/A;  Lithotomy, make sure botox is in the room  . EVALUATION UNDER ANESTHESIA WITH ANAL FISSUROTOMY N/A 07/23/2017   Procedure: EXAM UNDER ANESTHESIA WITH ANAL FISSUROTOMY, botox injection;  Surgeon: Jules Husbands, MD;  Location: [facility];  Service: General;  Laterality: N/A;  . HEMORRHOID SURGERY    . LEFT HEART CATH AND CORONARY ANGIOGRAPHY Right 10/30/2017   Procedure:  LEFT HEART CATH AND CORONARY ANGIOGRAPHY;  Surgeon: Dionisio David, MD;  Location: [place] [facility];  Service: Cardiovascular;  Laterality: Right;  . LEFT HEART CATH AND CORONARY ANGIOGRAPHY Right 11/20/2017   Procedure: LEFT HEART CATH AND CORONARY ANGIOGRAPHY;  Surgeon: Dionisio David, MD;  Location: [place] [facility];  Service: Cardiovascular;  Laterality: Right;  . ROTATOR CUFF REPAIR  1991  . TEE WITHOUT CARDIOVERSION N/A  11/03/2017   Procedure: TRANSESOPHAGEAL ECHOCARDIOGRAM (TEE);  Surgeon: Grace Isaac, MD;  Location: [place];  Service: Open Heart Surgery;  Laterality: N/A;    Prior to Admission medications   Medication Sig Start Date End Date Taking? Authorizing Provider  aspirin EC 325 MG EC tablet Take 1 tablet (325 mg total) by mouth daily. Patient not taking: Reported on 04/10/2018 11/08/17   Elgie Collard, PA-C  atorvastatin (LIPITOR) 40 MG tablet Take 1 tablet (40 mg total) by mouth daily. Patient not taking: Reported on 04/10/2018 11/08/17   Elgie Collard, PA-C  diphenoxylate-atropine (LOMOTIL) 2.5-0.025 MG tablet Take 1 tablet by mouth 4 (four) times daily as needed for diarrhea or loose stools. Patient not taking: Reported on 04/10/2018 03/16/18 03/16/19  Orbie Pyo, MD  furosemide (LASIX) 40 MG tablet Take 1 tablet (40 mg total) by mouth daily for 5 days. Patient not taking: Reported on 11/19/2017 11/08/17 11/13/17  Elgie Collard, PA-C  hydrocortisone (PROCTOZONE-HC) 2.5 % rectal cream  10/30/16   [provider]  hyoscyamine (ANASPAZ) 0.125 MG TBDP disintergrating tablet Place 0.125 mg under the tongue every 4 (four) hours as needed for cramping.     [provider]  isosorbide mononitrate (IMDUR) 30 MG 24 hr tablet Take 1 tablet (30 mg total) by mouth daily. 11/20/17   Henreitta Leber, MD  lidocaine (LIDODERM) 5 % Place 1 patch onto the skin every 12 (twelve) hours. Remove & Discard patch within 12 hours or as directed by MD Patient not taking: Reported on 04/23/2018 04/03/18 04/03/19  Merlyn Lot, MD  metoprolol tartrate (LOPRESSOR) 25 MG tablet Take 0.5 tablets (12.5 mg total) by mouth 2 (two) times daily. Patient not taking: Reported on 04/23/2018 11/08/17   Elgie Collard, PA-C  naproxen (NAPROSYN) 500 MG tablet Take 1 tablet (500 mg total) by mouth 2 (two) times daily with a meal. Patient not taking: Reported on 04/23/2018 12/26/17   Lavonia Drafts, MD    oxyCODONE-acetaminophen (PERCOCET) 5-325 MG tablet Take 1 tablet by mouth every 8 (eight) hours as needed. 05/18/18   Earleen Newport, MD  polyethylene glycol-electrolytes (NULYTELY/GOLYTELY) 420 g solution As directed 04/10/18   Virgel Manifold, MD  potassium chloride SA (K-DUR,KLOR-CON) 20 MEQ tablet Take 1 tablet (20 mEq total) by mouth daily for 5 days. 11/08/17 11/19/17  Elgie Collard, PA-C  REPATHA 140 MG/ML SOSY  02/16/18   [provider]  simethicone (MYLICON) 80 MG chewable tablet Chew 1 tablet (80 mg total) by mouth 4 (four) times daily as needed for flatulence. Patient not taking: Reported on 04/10/2018 11/08/17   Elgie Collard, PA-C  traMADol (ULTRAM) 50 MG tablet Take 1 tablet (50 mg total) by mouth every 6 (six) hours as needed. 04/03/18 04/03/19  Merlyn Lot, MD  triamcinolone ointment (KENALOG) 0.5 % Apply 1 application topically 2 (two) times daily. 05/25/18   Rudene Re, MD    Allergies Bee venom; Codeine; and Sulfa antibiotics  Family History  Problem Relation Age of Onset  . Breast cancer Mother   .  Huntington's disease Father   . Cancer Paternal Uncle        Colon  . Diabetes Son   . Cancer Maternal Grandfather   . Cancer Paternal Grandmother   . Heart disease Paternal Grandmother   . Huntington's disease Paternal Grandmother     Social History Social History   Tobacco Use  . Smoking status: Former Smoker    Packs/day: 0.50    Years: 20.00    Pack years: 10.00    Types: Cigarettes    Last attempt to quit: 06/16/2017    Years since quitting: 0.9  . Smokeless tobacco: Never Used  Substance Use Topics  . Alcohol use: No    Alcohol/week: 0.0 oz  . Drug use: No    Review of Systems  Constitutional: Negative for fever. Eyes: Negative for visual changes. ENT: Negative for sore throat. Neck: No neck pain  Cardiovascular: Negative for chest pain. Respiratory: Negative for shortness of breath. Gastrointestinal: Negative for  abdominal pain, vomiting or diarrhea. Genitourinary: Negative for dysuria. Musculoskeletal: Negative for back pain. Skin: + skin irritation and rash. Neurological: Negative for headaches, weakness or numbness. Psych: No SI or HI  ____________________________________________   PHYSICAL EXAM:  VITAL SIGNS: ED Triage Vitals [05/25/18 1358]  Enc Vitals Group     BP (!) 152/87     Pulse Rate 60     Resp 16     Temp 98.6 F (37 C)     Temp Source Oral     SpO2 100 %     Weight 130 lb (59 kg)     Height 5\' 10"  (1.778 m)     Head Circumference      Peak Flow      Pain Score 8     Pain Loc      Pain Edu?      Excl. in [place]?     Constitutional: Alert and oriented. Well appearing and in no apparent distress. HEENT:      Head: Normocephalic and atraumatic.         Eyes: Conjunctivae are normal. Sclera is non-icteric.       Mouth/Throat: Mucous membranes are moist.       Neck: Supple with no signs of meningismus. Cardiovascular: Regular rate and rhythm. No murmurs, gallops, or rubs. 2+ symmetrical distal pulses are present in all extremities. No JVD. Respiratory: Normal respiratory effort. Lungs are clear to auscultation bilaterally. No wheezes, crackles, or rhonchi.  Gastrointestinal: Soft, non tender, and non distended with positive bowel sounds. No rebound or guarding. Musculoskeletal: Nontender with normal range of motion in all extremities. No edema, cyanosis, or erythema of extremities. Skin: There are a few small skin colored papules located in the abdominal wall with no surrounding erythema, pus, or warmth. There excoriations over them.  Psychiatric: Mood and affect are normal. Speech and behavior are normal.  ____________________________________________   LABS (all labs ordered are listed, but only abnormal results are displayed)  Labs Reviewed  CBC - Abnormal; Notable for the following components:      Result Value   WBC 3.1 (*)    RDW 15.2 (*)    All other  components within normal limits  LIPASE, BLOOD  COMPREHENSIVE METABOLIC PANEL  URINALYSIS, COMPLETE (UACMP) WITH MICROSCOPIC   ____________________________________________  EKG  none  ____________________________________________  RADIOLOGY  none  ____________________________________________   PROCEDURES  Procedure(s) performed: None Procedures Critical Care performed:  None ____________________________________________   INITIAL IMPRESSION / ASSESSMENT AND PLAN / ED COURSE  52 y.o. male who presents for evaluation of a skin irritation and rash.  Patient with skin colored small papules over the abdominal wall with some excoriations over it.  No signs of cellulitis, abscess, purulent discharge.  Unclear etiology at this time but consistent with dermatosis.  Will start patient on topical steroid and refer to dermatology.      As part of my medical decision making, I reviewed the following data within the [place] notes reviewed and incorporated, Labs reviewed , Old chart reviewed, Notes from prior ED visits and [place] Controlled Substance Database    Pertinent labs & imaging results that were available during my care of the patient were reviewed by me and considered in my medical decision making (see chart for details).    ____________________________________________   FINAL CLINICAL IMPRESSION(S) / ED DIAGNOSES  Final diagnoses:  Skin irritation  Dermatosis      NEW MEDICATIONS STARTED DURING THIS VISIT:  ED Discharge Orders        Ordered    triamcinolone ointment (KENALOG) 0.5 %  2 times daily     05/25/18 1503       Note:  This document was prepared using Dragon voice recognition software and may include unintentional dictation errors.    Alfred Levins, [place], MD 05/25/18 [phone]

## 2018-05-25 NOTE — ED Notes (Addendum)
Patient points to mid abd above the navel.  Says it hurts really bad.  Hurts when skin is touched.  Has dry skin.  Patient in nad.  He has difficulty communicating, but is able to tell us it is the skin, not inside.  Says he has follow up appointments scheduled.

## 2018-05-25 NOTE — ED Notes (Signed)
Topaz pad not working for pt to sign. Pt verbally understood discharge instructions and follow up care.

## 2018-05-25 NOTE — Discharge Instructions (Signed)
Apply the topical steroid twice a day over the abdominal skin area that is painful. Follow up with Dermatology in 5-7 days.

## 2018-05-25 NOTE — ED Triage Notes (Signed)
Pt to ED reporting lower abd pain intermittently for the past three weeks. No nausea or vomiting but intermittent diarrhea also reported. No changes in urine and no fevers reported. Tenderness noted upon palpation. No signs of a hernia at this time.

## 2018-05-28 ENCOUNTER — Ambulatory Visit: Payer: BLUE CROSS/BLUE SHIELD | Admitting: Gastroenterology

## 2018-05-28 ENCOUNTER — Emergency Department (HOSPITAL_COMMUNITY)
Admission: EM | Admit: 2018-05-28 | Discharge: 2018-05-28 | Disposition: A | Discharge status: Home / Self Care | Payer: BLUE CROSS/BLUE SHIELD | Attending: Emergency Medicine | Admitting: Emergency Medicine

## 2018-05-28 ENCOUNTER — Encounter (HOSPITAL_COMMUNITY): Payer: Self-pay | Admitting: *Deleted

## 2018-05-28 DIAGNOSIS — I10 Essential (primary) hypertension: Secondary | ICD-10-CM | POA: Insufficient documentation

## 2018-05-28 DIAGNOSIS — R109 Unspecified abdominal pain: Secondary | ICD-10-CM | POA: Insufficient documentation

## 2018-05-28 DIAGNOSIS — Z951 Presence of aortocoronary bypass graft: Secondary | ICD-10-CM | POA: Diagnosis not present

## 2018-05-28 DIAGNOSIS — I252 Old myocardial infarction: Secondary | ICD-10-CM | POA: Diagnosis not present

## 2018-05-28 DIAGNOSIS — G8929 Other chronic pain: Secondary | ICD-10-CM | POA: Insufficient documentation

## 2018-05-28 DIAGNOSIS — I2510 Atherosclerotic heart disease of native coronary artery without angina pectoris: Secondary | ICD-10-CM | POA: Diagnosis not present

## 2018-05-28 DIAGNOSIS — Z87891 Personal history of nicotine dependence: Secondary | ICD-10-CM | POA: Diagnosis not present

## 2018-05-28 LAB — URINALYSIS, ROUTINE W REFLEX MICROSCOPIC
BILIRUBIN URINE: NEGATIVE
Bacteria, UA: NONE SEEN
GLUCOSE, UA: NEGATIVE mg/dL
Ketones, ur: NEGATIVE mg/dL
Leukocytes, UA: NEGATIVE
Nitrite: NEGATIVE
PH: 6 (ref 5.0–8.0)
Protein, ur: NEGATIVE mg/dL
SPECIFIC GRAVITY, URINE: 1.008 (ref 1.005–1.030)

## 2018-05-28 LAB — COMPREHENSIVE METABOLIC PANEL
ALBUMIN: 3.7 g/dL (ref 3.5–5.0)
ALK PHOS: 62 U/L (ref 38–126)
ALT: 20 U/L (ref 0–44)
AST: 23 U/L (ref 15–41)
Anion gap: 9 (ref 5–15)
BILIRUBIN TOTAL: 0.9 mg/dL (ref 0.3–1.2)
BUN: 5 mg/dL — AB (ref 6–20)
CO2: 26 mmol/L (ref 22–32)
CREATININE: 0.78 mg/dL (ref 0.61–1.24)
Calcium: 9.2 mg/dL (ref 8.9–10.3)
Chloride: 105 mmol/L (ref 98–111)
GFR calc Af Amer: >60 mL/min (ref >60)
GFR calc non Af Amer: >60 mL/min (ref >60)
GLUCOSE: 91 mg/dL (ref 70–99)
Potassium: 3.8 mmol/L (ref 3.5–5.1)
SODIUM: 140 mmol/L (ref 135–145)
TOTAL PROTEIN: 7.5 g/dL (ref 6.5–8.1)

## 2018-05-28 LAB — CBC
HCT: 44.3 % (ref 39.0–52.0)
Hemoglobin: 14.2 g/dL (ref 13.0–17.0)
MCH: 28.2 pg (ref 26.0–34.0)
MCHC: 32.1 g/dL (ref 30.0–36.0)
MCV: 87.9 fL (ref 78.0–100.0)
PLATELETS: 238 10*3/uL (ref 150–400)
RBC: 5.04 MIL/uL (ref 4.22–5.81)
RDW: 13.5 % (ref 11.5–15.5)
WBC: 3.7 10*3/uL — ABNORMAL LOW (ref 4.0–10.5)

## 2018-05-28 LAB — LIPASE, BLOOD: Lipase: 48 U/L (ref 11–51)

## 2018-05-28 MED ORDER — DICYCLOMINE HCL 20 MG PO TABS: 20.0000 mg | ORAL_TABLET | Freq: Two times a day (BID) | ORAL | 0 refills | Status: DC

## 2018-05-28 NOTE — ED Triage Notes (Signed)
Pt in c/o lower abdominal pain for the last few days, denies n/v

## 2018-05-28 NOTE — Discharge Instructions (Signed)
You were seen in the emergency department for abdominal pain that has been ongoing for several weeks.  Your lab work and urine tests were normal.  I have reviewed your recent ER and GI visits and recent imaging has also been reassuring.  At this time the cause of your pain is unclear.  We recommend you follow-up with GI as scheduled on the 31st for more discussion of your chronic abdominal pain.  In regards to the skin dryness, lesions, follow-up with dermatology.

## 2018-05-28 NOTE — ED Provider Notes (Signed)
[place] EMERGENCY DEPARTMENT Provider Note   CSN: [id number] Arrival date & time: 05/28/18  1001     History   Chief Complaint Chief Complaint  Patient presents with  . Abdominal Pain    HPI Jeffrey Prince is a 52 y.o. male with history of GERD, hypertension, diverticulosis, documented history of Jeffrey Prince although patient is unaware of this, chronic abdominal pain here for evaluation of abdominal pain.  Onset for several weeks. Patient is a difficult historian and frequently states "I do not know how to describe it".  Abdominal pain is diffuse, most significant around umbilicus.  Associated with subjective fevers, chills, diarrhea described as looser stools onset last night.  No hematochezia, melena, nausea, vomiting.  Also reports ongoing, tender dry hyperpigemented nodules to abdomen for several weeks. Chart shows patient has had multiple ER visits for chronic abdominal pain, documented having multiple GI providers at different centers.  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  Colonoscopy on 04/23/18 notable for benign cecum polyp otherwise normal.   HPI  Past Medical History:  Diagnosis Date  . Acne   . Anginal pain ([place])   . Blind left eye    Since birth  . Chorea   . Diverticulosis   . Dysrhythmia   . ED (erectile dysfunction)   . GERD (gastroesophageal reflux disease)   . Hemorrhoids   . Hypertension   . Weight loss    Abnormal    Patient Active Problem List   Diagnosis Date Noted  . Benign neoplasm of cecum   . Polyp of sigmoid colon   . Abnormal computed tomography of large intestine   . NSTEMI (non-ST elevated myocardial infarction) ([place]) 11/19/2017  . Coronary artery disease 11/03/2017  . ACS (acute coronary syndrome) ([place]) 10/31/2017  . Chest pain 10/30/2017  . Anal fissure   . Family history of Huntington's chorea 07/01/2017  . Unintentional weight loss  05/16/2015  . Diarrhea 05/16/2015  . GERD (gastroesophageal reflux disease) 05/16/2015  . Diffuse abdominal pain 05/16/2015  . Dyslipidemia (high LDL; low HDL) 01/25/2015  . Decreased visual acuity 01/24/2015  . Erectile dysfunction 01/24/2015  . Current every day smoker 01/24/2015  . 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 Hemorrhoids 03/16/2014    Past Surgical History:  Procedure Laterality Date  . BOTOX INJECTION N/A 10/09/2016   Procedure: BOTOX INJECTION;  Surgeon: Jules Husbands, MD;  Location: [facility];  Service: General;  Laterality: N/A;  . BOTOX INJECTION N/A 07/23/2017   Procedure: BOTOX INJECTION;  Surgeon: Jules Husbands, MD;  Location: [facility];  Service: General;  Laterality: N/A;  . CARDIAC SURGERY    . COLONOSCOPY     with polyp removal  . COLONOSCOPY WITH PROPOFOL N/A 04/23/2018   Procedure: COLONOSCOPY WITH PROPOFOL;  Surgeon: Virgel Manifold, MD;  Location: [facility];  Service: Endoscopy;  Laterality: N/A;  . CORONARY ARTERY BYPASS GRAFT N/A 11/03/2017   Procedure: CORONARY ARTERY BYPASS GRAFTING (CABG) x four, using left internal mammry artery and right leg greater saphenous vein harvested endoscopically (LIMA to LAD, SVG to OM1, SVG to RAMUS INTERMEDIATE, SVG to PDA)  ;  Surgeon: Grace Isaac, MD;  Location: [facility];  Service: Open Heart Surgery;  Laterality: N/A;  . EVALUATION UNDER ANESTHESIA WITH ANAL FISSUROTOMY N/A 10/09/2016   Procedure: EXAM UNDER ANESTHESIA WITH ANAL FISSUROTOMY chemical sphincterotomy w BOtox.;  Surgeon: Jules Husbands, MD;  Location: [facility];  Service: General;  Laterality: N/A;  Lithotomy, make sure botox is in the room  . EVALUATION UNDER ANESTHESIA WITH ANAL FISSUROTOMY N/A 07/23/2017   Procedure: EXAM UNDER ANESTHESIA WITH ANAL FISSUROTOMY, botox injection;  Surgeon: Jules Husbands, MD;  Location: [facility];  Service: General;  Laterality: N/A;  . HEMORRHOID SURGERY    . LEFT HEART CATH AND CORONARY ANGIOGRAPHY Right  10/30/2017   Procedure: LEFT HEART CATH AND CORONARY ANGIOGRAPHY;  Surgeon: Dionisio David, MD;  Location: [facility];  Service: Cardiovascular;  Laterality: Right;  . LEFT HEART CATH AND CORONARY ANGIOGRAPHY Right 11/20/2017   Procedure: LEFT HEART CATH AND CORONARY ANGIOGRAPHY;  Surgeon: Dionisio David, MD;  Location: [facility];  Service: Cardiovascular;  Laterality: Right;  . ROTATOR CUFF REPAIR  1991  . TEE WITHOUT CARDIOVERSION N/A 11/03/2017   Procedure: TRANSESOPHAGEAL ECHOCARDIOGRAM (TEE);  Surgeon: Grace Isaac, MD;  Location: [facility];  Service: Open Heart Surgery;  Laterality: N/A;        Home Medications    Prior to Admission medications   Medication Sig Start Date End Date Taking? Authorizing Provider  oxyCODONE-acetaminophen (PERCOCET) 5-325 MG tablet Take 1 tablet by mouth every 8 (eight) hours as needed. 05/18/18  Yes Earleen Newport, MD  aspirin EC 325 MG EC tablet Take 1 tablet (325 mg total) by mouth daily. Patient not taking: Reported on 05/28/2018 11/08/17   Elgie Collard, PA-C  atorvastatin (LIPITOR) 40 MG tablet Take 1 tablet (40 mg total) by mouth daily. Patient not taking: Reported on 05/28/2018 11/08/17   Elgie Collard, PA-C  dicyclomine (BENTYL) 20 MG tablet Take 1 tablet (20 mg total) by mouth 2 (two) times daily. 05/28/18   Kinnie Feil, PA-C  diphenoxylate-atropine (LOMOTIL) 2.5-0.025 MG tablet Take 1 tablet by mouth 4 (four) times daily as needed for diarrhea or loose stools. Patient not taking: Reported on 04/10/2018 03/16/18 03/16/19  Orbie Pyo, MD  furosemide (LASIX) 40 MG tablet Take 1 tablet (40 mg total) by mouth daily for 5 days. Patient not taking: Reported on 05/28/2018 11/08/17 11/13/17  Elgie Collard, PA-C  isosorbide mononitrate (IMDUR) 30 MG 24 hr tablet Take 1 tablet (30 mg total) by mouth daily. Patient not taking: Reported on 05/28/2018 11/20/17   Henreitta Leber, MD  lidocaine (LIDODERM) 5 % Place 1 patch  onto the skin every 12 (twelve) hours. Remove & Discard patch within 12 hours or as directed by MD Patient not taking: Reported on 05/28/2018 04/03/18 04/03/19  Merlyn Lot, MD  metoprolol tartrate (LOPRESSOR) 25 MG tablet Take 0.5 tablets (12.5 mg total) by mouth 2 (two) times daily. Patient not taking: Reported on 05/28/2018 11/08/17   Elgie Collard, PA-C  naproxen (NAPROSYN) 500 MG tablet Take 1 tablet (500 mg total) by mouth 2 (two) times daily with a meal. Patient not taking: Reported on 05/28/2018 12/26/17   Lavonia Drafts, MD  polyethylene glycol-electrolytes (NULYTELY/GOLYTELY) 420 g solution As directed Patient not taking: Reported on 05/28/2018 04/10/18   Virgel Manifold, MD  potassium chloride SA (K-DUR,KLOR-CON) 20 MEQ tablet Take 1 tablet (20 mEq total) by mouth daily for 5 days. Patient not taking: Reported on 05/28/2018 11/08/17 11/19/17  Elgie Collard, PA-C  simethicone (MYLICON) 80 MG chewable tablet Chew 1 tablet (80 mg total) by mouth 4 (four) times daily as needed for flatulence. Patient not taking: Reported on 04/10/2018 11/08/17   Elgie Collard, PA-C  traMADol (ULTRAM) 50 MG tablet Take 1 tablet (50 mg total) by mouth every 6 (six) hours as needed. Patient not taking: Reported on 05/28/2018 04/03/18 04/03/19  Merlyn Lot, MD  triamcinolone ointment (KENALOG) 0.5 % Apply 1 application topically 2 (two) times daily. Patient not taking: Reported on 05/28/2018 05/25/18   Rudene Re, MD    Family History Family History  Problem Relation Age of Onset  . Breast cancer Mother   . Huntington's disease Father   . Cancer Paternal Uncle        Colon  . Diabetes Son   . Cancer Maternal Grandfather   . Cancer Paternal Grandmother   . Heart disease Paternal Grandmother   . Huntington's disease Paternal Grandmother     Social History Social History   Tobacco Use  . Smoking status: Former Smoker    Packs/day: 0.50    Years: 20.00    Pack years: 10.00    Types:  Cigarettes    Last attempt to quit: 06/16/2017    Years since quitting: 0.9  . Smokeless tobacco: Never Used  Substance Use Topics  . Alcohol use: No    Alcohol/week: 0.0 oz  . Drug use: No     Allergies   Bee venom; Codeine; and Sulfa antibiotics   Review of Systems Review of Systems  Constitutional: Positive for chills and fever.  Gastrointestinal: Positive for abdominal pain and diarrhea.  All other systems reviewed and are negative.    Physical Exam Updated Vital Signs BP (!) 138/93 (BP Location: Right Arm)   Pulse (!) 52   Temp 98.2 F (36.8 C) (Oral)   Resp 18   SpO2 100%   Physical Exam  Constitutional: He is oriented to person, place, and time. He appears well-developed and well-nourished. No distress.  Non toxic.  HENT:  Head: Normocephalic and atraumatic.  Nose: Nose normal.  Moist mucous membranes   Eyes: Pupils are equal, round, and reactive to light. Conjunctivae and EOM are normal.  Neck: Normal range of motion.  Cardiovascular: Normal rate, regular rhythm and normal heart sounds.  Pulmonary/Chest: Effort normal and breath sounds normal.  Abdominal: Soft. Bowel sounds are normal. There is generalized tenderness and tenderness in the suprapubic area. There is guarding.  Nonfocal, generalized tenderness most significant at periumbilical and epigastric regions.  Mild guarding noted with deep palpation of the suprapubic region.  No CVA tenderness.  Soft.  No distention.  Active bowel sounds.  Negative Murphy's and McBurney's.  Musculoskeletal: Normal range of motion.  Neurological: He is alert and oriented to person, place, and time.  Skin: Skin is warm and dry. Capillary refill takes less than 2 seconds.  Skin over anterior, upper thighs appear dry, hyperpigmented with excoriations.  Multiple, noninflamed hyperpigmented papules throughout lower abdominal area.  Patient is actively scratching at these lesions.  No surrounding erythema, edema, warmth,  fluctuance or tenderness.  Psychiatric: He has a normal mood and affect. His behavior is normal. Judgment and thought content normal.  Nursing note and vitals reviewed.    ED Treatments / Results  Labs (all labs ordered are listed, but only abnormal results are displayed) Labs Reviewed  COMPREHENSIVE METABOLIC PANEL - Abnormal; Notable for the following components:      Result Value   BUN 5 (*)    All other components within normal limits  CBC - Abnormal; Notable for the following components:   WBC 3.7 (*)    All other components within normal limits  URINALYSIS, ROUTINE W  REFLEX MICROSCOPIC - Abnormal; Notable for the following components:   Hgb urine dipstick SMALL (*)    All other components within normal limits  LIPASE, BLOOD    EKG None  Radiology No results found.  Procedures Procedures (including critical care time)  Medications Ordered in ED Medications - No data to display   Initial Impression / Assessment and Plan / ED Course  I have reviewed the triage vital signs and the nursing notes.  Pertinent labs & imaging results that were available during my care of the patient were reviewed by me and considered in my medical decision making (see chart for details).     52 year old male with history of chronic abdominal pain, proctocolitis is here for evaluation of diffuse abdominal pain, looser stools, subjective fevers and chills onset last night.  His exam is nonfocal.  No peritonitis.  Chart review shows patient has had work-up for abdominal pain by multiple GI providers at different centers, has had recurrent ER visits for similar.  Last had a colonoscopy 1 month ago that was benign.  Lab work today is unremarkable.  Urinalysis without signs of infection.  Considered possible viral gastroenteritis versus a diverticulitis however he has no fever, nausea, vomiting, blood in stools.  Given extensive, recent work-up and nonfocal exam today we will defer further emergent  CT scan today.  Additionally, patient has GI follow-up established and has an appointment in 6 days.  Encouraged pt to f/u with GI appointment.  He reported ongoing dry, pruritic, hyperpigment papules/nodules on abdomen and upper thighs for several weeks as well, has been referred to surgery for this but will give dermatology appointment. Discussed return precautions.   Final Clinical Impressions(s) / ED Diagnoses   Final diagnoses:  Chronic abdominal pain    ED Discharge Orders        Ordered    dicyclomine (BENTYL) 20 MG tablet  2 times daily     05/28/18 1311       Kinnie Feil, [place] 05/28/18 1505    Little, Wenda Over[place], MD 05/29/18 [phone]

## 2018-06-03 ENCOUNTER — Ambulatory Visit: Payer: BLUE CROSS/BLUE SHIELD | Admitting: Gastroenterology

## 2018-06-07 ENCOUNTER — Emergency Department
Admission: EM | Admit: 2018-06-07 | Discharge: 2018-06-07 | Disposition: A | Discharge status: Home / Self Care | Payer: Medicaid Other | Attending: Emergency Medicine | Admitting: Emergency Medicine

## 2018-06-07 ENCOUNTER — Encounter: Payer: Self-pay | Admitting: Emergency Medicine

## 2018-06-07 ENCOUNTER — Other Ambulatory Visit: Payer: Self-pay

## 2018-06-07 DIAGNOSIS — B958 Unspecified staphylococcus as the cause of diseases classified elsewhere: Secondary | ICD-10-CM | POA: Diagnosis not present

## 2018-06-07 DIAGNOSIS — Z951 Presence of aortocoronary bypass graft: Secondary | ICD-10-CM | POA: Diagnosis not present

## 2018-06-07 DIAGNOSIS — Z87891 Personal history of nicotine dependence: Secondary | ICD-10-CM | POA: Diagnosis not present

## 2018-06-07 DIAGNOSIS — R21 Rash and other nonspecific skin eruption: Secondary | ICD-10-CM | POA: Insufficient documentation

## 2018-06-07 DIAGNOSIS — I10 Essential (primary) hypertension: Secondary | ICD-10-CM | POA: Insufficient documentation

## 2018-06-07 DIAGNOSIS — Z79899 Other long term (current) drug therapy: Secondary | ICD-10-CM | POA: Insufficient documentation

## 2018-06-07 DIAGNOSIS — Z7982 Long term (current) use of aspirin: Secondary | ICD-10-CM | POA: Diagnosis not present

## 2018-06-07 DIAGNOSIS — Z955 Presence of coronary angioplasty implant and graft: Secondary | ICD-10-CM | POA: Diagnosis not present

## 2018-06-07 DIAGNOSIS — I252 Old myocardial infarction: Secondary | ICD-10-CM | POA: Insufficient documentation

## 2018-06-07 MED ORDER — CEPHALEXIN 500 MG PO CAPS: 500.0000 mg | ORAL_CAPSULE | Freq: Three times a day (TID) | ORAL | 0 refills | Status: AC

## 2018-06-07 NOTE — ED Provider Notes (Signed)
[facility] Emergency Department Provider Note  ____________________________________________  Time seen: Approximately 11:21 PM  I have reviewed the triage vital signs and the nursing notes.   HISTORY  Chief Complaint Rash    HPI Jeffrey Prince is a 52 y.o. male presents to the emergency department with a rash.  Patient reports that rash is localized to shoulders, chest and abdomen reportedly for the past week.  Patient describes rash as pruritic.  Patient was diagnosed with dermatosis on 05/25/2018 at [facility] and prescribed topical steroids.  Patient reports that medication did not relieve his symptoms.  Patient  states that rash on his abdomen is "sometimes painful."  Patient states "I do not know how to describe this".  Patient has been seen in the recent past by multiple ED providers in multiple ED's for GI complaints.  Patient denies recent nausea, vomiting or fever. No alleviating measures have been attempted.    Past Medical History:  Diagnosis Date  . Acne   . Anginal pain ([place])   . Blind left eye    Since birth  . Chorea   . Diverticulosis   . Dysrhythmia   . ED (erectile dysfunction)   . GERD (gastroesophageal reflux disease)   . Hemorrhoids   . Hypertension   . Weight loss    Abnormal    Patient Active Problem List   Diagnosis Date Noted  . Benign neoplasm of cecum   . Polyp of sigmoid colon   . Abnormal computed tomography of large intestine   . NSTEMI (non-ST elevated myocardial infarction) ([place]) 11/19/2017  . Coronary artery disease 11/03/2017  . ACS (acute coronary syndrome) ([place]) 10/31/2017  . Chest pain 10/30/2017  . Anal fissure   . Family history of Huntington's chorea 07/01/2017  . Unintentional weight loss 05/16/2015  . Diarrhea 05/16/2015  . GERD (gastroesophageal reflux disease) 05/16/2015  . Diffuse abdominal pain 05/16/2015  . Dyslipidemia (high LDL; low HDL) 01/25/2015  . Decreased visual acuity 01/24/2015  .  Erectile dysfunction 01/24/2015  . Current every day smoker 01/24/2015  . 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 Hemorrhoids 03/16/2014    Past Surgical History:  Procedure Laterality Date  . BOTOX INJECTION N/A 10/09/2016   Procedure: BOTOX INJECTION;  Surgeon: Jules Husbands, MD;  Location: [facility];  Service: General;  Laterality: N/A;  . BOTOX INJECTION N/A 07/23/2017   Procedure: BOTOX INJECTION;  Surgeon: Jules Husbands, MD;  Location: [facility];  Service: General;  Laterality: N/A;  . CARDIAC SURGERY    . COLONOSCOPY     with polyp removal  . COLONOSCOPY WITH PROPOFOL N/A 04/23/2018   Procedure: COLONOSCOPY WITH PROPOFOL;  Surgeon: Virgel Manifold, MD;  Location: [facility];  Service: Endoscopy;  Laterality: N/A;  . CORONARY ARTERY BYPASS GRAFT N/A 11/03/2017   Procedure: CORONARY ARTERY BYPASS GRAFTING (CABG) x four, using left internal mammry artery and right leg greater saphenous vein harvested endoscopically (LIMA to LAD, SVG to OM1, SVG to RAMUS INTERMEDIATE, SVG to PDA)  ;  Surgeon: Grace Isaac, MD;  Location: [facility];  Service: Open Heart Surgery;  Laterality: N/A;  . EVALUATION UNDER ANESTHESIA WITH ANAL FISSUROTOMY N/A 10/09/2016   Procedure: EXAM UNDER ANESTHESIA WITH ANAL FISSUROTOMY chemical sphincterotomy w BOtox.;  Surgeon: Jules Husbands, MD;  Location: [facility];  Service: General;  Laterality: N/A;  Lithotomy, make sure botox is in the room  . EVALUATION UNDER ANESTHESIA WITH ANAL FISSUROTOMY N/A 07/23/2017   Procedure: EXAM UNDER  ANESTHESIA WITH ANAL FISSUROTOMY, botox injection;  Surgeon: Jules Husbands, MD;  Location: [facility];  Service: General;  Laterality: N/A;  . HEMORRHOID SURGERY    . LEFT HEART CATH AND CORONARY ANGIOGRAPHY Right 10/30/2017   Procedure: LEFT HEART CATH AND CORONARY ANGIOGRAPHY;  Surgeon: Dionisio David, MD;  Location: [facility];  Service: Cardiovascular;  Laterality: Right;  . LEFT HEART CATH AND CORONARY ANGIOGRAPHY  Right 11/20/2017   Procedure: LEFT HEART CATH AND CORONARY ANGIOGRAPHY;  Surgeon: Dionisio David, MD;  Location: [facility];  Service: Cardiovascular;  Laterality: Right;  . ROTATOR CUFF REPAIR  1991  . TEE WITHOUT CARDIOVERSION N/A 11/03/2017   Procedure: TRANSESOPHAGEAL ECHOCARDIOGRAM (TEE);  Surgeon: Grace Isaac, MD;  Location: [facility];  Service: Open Heart Surgery;  Laterality: N/A;    Prior to Admission medications   Medication Sig Start Date End Date Taking? Authorizing Provider  aspirin EC 325 MG EC tablet Take 1 tablet (325 mg total) by mouth daily. Patient not taking: Reported on 05/28/2018 11/08/17   Elgie Collard, PA-C  atorvastatin (LIPITOR) 40 MG tablet Take 1 tablet (40 mg total) by mouth daily. Patient not taking: Reported on 05/28/2018 11/08/17   Elgie Collard, PA-C  cephALEXin (KEFLEX) 500 MG capsule Take 1 capsule (500 mg total) by mouth 3 (three) times daily for 10 days. 06/07/18 06/17/18  Lannie Fields, PA-C  dicyclomine (BENTYL) 20 MG tablet Take 1 tablet (20 mg total) by mouth 2 (two) times daily. 05/28/18   Kinnie Feil, PA-C  diphenoxylate-atropine (LOMOTIL) 2.5-0.025 MG tablet Take 1 tablet by mouth 4 (four) times daily as needed for diarrhea or loose stools. Patient not taking: Reported on 04/10/2018 03/16/18 03/16/19  Orbie Pyo, MD  furosemide (LASIX) 40 MG tablet Take 1 tablet (40 mg total) by mouth daily for 5 days. Patient not taking: Reported on 05/28/2018 11/08/17 11/13/17  Elgie Collard, PA-C  isosorbide mononitrate (IMDUR) 30 MG 24 hr tablet Take 1 tablet (30 mg total) by mouth daily. Patient not taking: Reported on 05/28/2018 11/20/17   Henreitta Leber, MD  lidocaine (LIDODERM) 5 % Place 1 patch onto the skin every 12 (twelve) hours. Remove & Discard patch within 12 hours or as directed by MD Patient not taking: Reported on 05/28/2018 04/03/18 04/03/19  Merlyn Lot, MD  metoprolol tartrate (LOPRESSOR) 25 MG tablet Take 0.5 tablets  (12.5 mg total) by mouth 2 (two) times daily. Patient not taking: Reported on 05/28/2018 11/08/17   Elgie Collard, PA-C  naproxen (NAPROSYN) 500 MG tablet Take 1 tablet (500 mg total) by mouth 2 (two) times daily with a meal. Patient not taking: Reported on 05/28/2018 12/26/17   Lavonia Drafts, MD  oxyCODONE-acetaminophen (PERCOCET) 5-325 MG tablet Take 1 tablet by mouth every 8 (eight) hours as needed. 05/18/18   Earleen Newport, MD  polyethylene glycol-electrolytes (NULYTELY/GOLYTELY) 420 g solution As directed Patient not taking: Reported on 05/28/2018 04/10/18   Virgel Manifold, MD  potassium chloride SA (K-DUR,KLOR-CON) 20 MEQ tablet Take 1 tablet (20 mEq total) by mouth daily for 5 days. Patient not taking: Reported on 05/28/2018 11/08/17 11/19/17  Elgie Collard, PA-C  simethicone (MYLICON) 80 MG chewable tablet Chew 1 tablet (80 mg total) by mouth 4 (four) times daily as needed for flatulence. Patient not taking: Reported on 04/10/2018 11/08/17   Elgie Collard, PA-C  traMADol (ULTRAM) 50 MG tablet Take 1 tablet (50 mg total) by mouth  every 6 (six) hours as needed. Patient not taking: Reported on 05/28/2018 04/03/18 04/03/19  Merlyn Lot, MD  triamcinolone ointment (KENALOG) 0.5 % Apply 1 application topically 2 (two) times daily. Patient not taking: Reported on 05/28/2018 05/25/18   Rudene Re, MD    Allergies Bee venom; Codeine; and Sulfa antibiotics  Family History  Problem Relation Age of Onset  . Breast cancer Mother   . Huntington's disease Father   . Cancer Paternal Uncle        Colon  . Diabetes Son   . Cancer Maternal Grandfather   . Cancer Paternal Grandmother   . Heart disease Paternal Grandmother   . Huntington's disease Paternal Grandmother     Social History Social History   Tobacco Use  . Smoking status: Former Smoker    Packs/day: 0.50    Years: 20.00    Pack years: 10.00    Types: Cigarettes    Last attempt to quit: 06/16/2017    Years since  quitting: 0.9  . Smokeless tobacco: Never Used  Substance Use Topics  . Alcohol use: No    Alcohol/week: 0.0 oz  . Drug use: No     Review of Systems  Constitutional: No fever/chills Eyes: No visual changes. No discharge ENT: No upper respiratory complaints. Cardiovascular: no chest pain. Respiratory: no cough. No SOB. Gastrointestinal: No abdominal pain.  No nausea, no vomiting.  No diarrhea.  No constipation. Genitourinary: Negative for dysuria. No hematuria Musculoskeletal: Negative for musculoskeletal pain. Skin: Patient has rash. Neurological: Negative for headaches, focal weakness or numbness.   ____________________________________________   PHYSICAL EXAM:  VITAL SIGNS: ED Triage Vitals  Enc Vitals Group     BP 06/07/18 1815 (!) 170/88     Pulse Rate 06/07/18 1815 (!) 107     Resp 06/07/18 1815 16     Temp 06/07/18 1815 98 F (36.7 C)     Temp Source 06/07/18 1815 Oral     SpO2 06/07/18 1815 99 %     Weight 06/07/18 1813 130 lb (59 kg)     Height 06/07/18 1813 5\' 6"  (1.676 m)     Head Circumference --      Peak Flow --      Pain Score 06/07/18 1813 0     Pain Loc --      Pain Edu? --      Excl. in [place]? --      Constitutional: Alert and oriented. Well appearing and in no acute distress. Eyes: Conjunctivae are normal. PERRL. EOMI. Head: Atraumatic. Cardiovascular: Normal rate, regular rhythm. Normal S1 and S2.  Good peripheral circulation. Respiratory: Normal respiratory effort without tachypnea or retractions. Lungs CTAB. Good air entry to the bases with no decreased or absent breath sounds. Gastrointestinal: Bowel sounds 4 quadrants. Soft and nontender to palpation. No guarding or rigidity. No palpable masses. No distention. No CVA tenderness. Musculoskeletal: Full range of motion to all extremities. No gross deformities appreciated. Neurologic:  Normal speech and language. No gross focal neurologic deficits are appreciated.  Skin:   Patient has diffuse,  papular rash in irregular distribution over the shoulders and sporadic papular rash of the chest and abdomen.  There are small purulent vesicles. No cellulitis.  Psychiatric: Mood and affect are normal. Speech and behavior are normal. Patient exhibits appropriate insight and judgement.   ____________________________________________   LABS (all labs ordered are listed, but only abnormal results are displayed)  Labs Reviewed - No data to display ____________________________________________  EKG  ____________________________________________  RADIOLOGY   No results found.  ____________________________________________    PROCEDURES  Procedure(s) performed:    Procedures    Medications - No data to display   ____________________________________________   INITIAL IMPRESSION / ASSESSMENT AND PLAN / ED COURSE  Pertinent labs & imaging results that were available during my care of the patient were reviewed by me and considered in my medical decision making (see chart for details).  Review of the [place] CSRS was performed in accordance of the [place] prior to dispensing any controlled drugs.      Assessment and plan Rash Patient presents to the emergency department with complaint of rash.  On physical exam, a papular rash is visualized over the shoulders, chest and abdomen.  In some regions, purulent vesicles could be visualized.  Patient was treated empirically with Keflex for likely staph infection.  Patient was advised to follow-up with dermatology if rash persists.  Vital signs were reassuring prior to discharge.  All patient questions were answered.    ____________________________________________  FINAL CLINICAL IMPRESSION(S) / ED DIAGNOSES  Final diagnoses:  Staph infection      NEW MEDICATIONS STARTED DURING THIS VISIT:  ED Discharge Orders        Ordered    cephALEXin (KEFLEX) 500 MG capsule  3 times daily     06/07/18 2005          This chart was  dictated using voice recognition software/Dragon. Despite best efforts to proofread, errors can occur which can change the meaning. Any change was purely unintentional.    Lannie Fields, PA-C 06/07/18 2329    Nance Pear, MD 06/11/18 [phone]

## 2018-06-07 NOTE — ED Triage Notes (Signed)
Rash to chest x 1 week

## 2018-06-07 NOTE — ED Notes (Signed)
Pt reports rash to his body for the last couple of weeks. Pt reports they itch but they hurt more than they itch. Pt describes the pain as a stinging prickly pain. Denies new soaps, foods, clothes or other things. Denies all other sx's.

## 2018-06-10 ENCOUNTER — Encounter: Payer: Self-pay | Admitting: *Deleted

## 2018-06-16 ENCOUNTER — Ambulatory Visit: Payer: Self-pay | Admitting: General Surgery

## 2018-06-19 ENCOUNTER — Encounter: Payer: Self-pay | Admitting: General Surgery

## 2018-06-23 ENCOUNTER — Emergency Department: Payer: Medicaid Other

## 2018-06-23 ENCOUNTER — Encounter: Payer: Self-pay | Admitting: Emergency Medicine

## 2018-06-23 ENCOUNTER — Emergency Department
Admission: EM | Admit: 2018-06-23 | Discharge: 2018-06-23 | Disposition: A | Discharge status: Home / Self Care | Payer: Medicaid Other | Attending: Emergency Medicine | Admitting: Emergency Medicine

## 2018-06-23 DIAGNOSIS — I10 Essential (primary) hypertension: Secondary | ICD-10-CM | POA: Insufficient documentation

## 2018-06-23 DIAGNOSIS — R1010 Upper abdominal pain, unspecified: Secondary | ICD-10-CM | POA: Insufficient documentation

## 2018-06-23 DIAGNOSIS — Z87891 Personal history of nicotine dependence: Secondary | ICD-10-CM | POA: Insufficient documentation

## 2018-06-23 DIAGNOSIS — R079 Chest pain, unspecified: Secondary | ICD-10-CM | POA: Diagnosis not present

## 2018-06-23 DIAGNOSIS — R109 Unspecified abdominal pain: Secondary | ICD-10-CM

## 2018-06-23 DIAGNOSIS — I2510 Atherosclerotic heart disease of native coronary artery without angina pectoris: Secondary | ICD-10-CM | POA: Insufficient documentation

## 2018-06-23 LAB — HEPATIC FUNCTION PANEL
ALT: 28 U/L (ref 0–44)
AST: 29 U/L (ref 15–41)
Albumin: 3.8 g/dL (ref 3.5–5.0)
Alkaline Phosphatase: 55 U/L (ref 38–126)
BILIRUBIN DIRECT: 0.2 mg/dL (ref 0.0–0.2)
BILIRUBIN TOTAL: 1.1 mg/dL (ref 0.3–1.2)
Indirect Bilirubin: 0.9 mg/dL (ref 0.3–0.9)
Total Protein: 7.5 g/dL (ref 6.5–8.1)

## 2018-06-23 LAB — BASIC METABOLIC PANEL
ANION GAP: 9 (ref 5–15)
BUN: 9 mg/dL (ref 6–20)
CHLORIDE: 96 mmol/L — AB (ref 98–111)
CO2: 28 mmol/L (ref 22–32)
Calcium: 9.2 mg/dL (ref 8.9–10.3)
Creatinine, Ser: 0.81 mg/dL (ref 0.61–1.24)
GFR calc non Af Amer: >60 mL/min (ref >60)
Glucose, Bld: 110 mg/dL — ABNORMAL HIGH (ref 70–99)
POTASSIUM: 3.2 mmol/L — AB (ref 3.5–5.1)
Sodium: 133 mmol/L — ABNORMAL LOW (ref 135–145)

## 2018-06-23 LAB — CBC
HCT: 45.1 % (ref 40.0–52.0)
HEMOGLOBIN: 15.8 g/dL (ref 13.0–18.0)
MCH: 29.8 pg (ref 26.0–34.0)
MCHC: 35.1 g/dL (ref 32.0–36.0)
MCV: 85 fL (ref 80.0–100.0)
Platelets: 267 10*3/uL (ref 150–440)
RBC: 5.3 MIL/uL (ref 4.40–5.90)
RDW: 14.7 % — ABNORMAL HIGH (ref 11.5–14.5)
WBC: 3.6 10*3/uL — ABNORMAL LOW (ref 3.8–10.6)

## 2018-06-23 LAB — TROPONIN I: Troponin I: <0.03 ng/mL (ref <0.03)

## 2018-06-23 LAB — LIPASE, BLOOD: Lipase: 25 U/L (ref 11–51)

## 2018-06-23 MED ORDER — KETOROLAC TROMETHAMINE 30 MG/ML IJ SOLN
30.0000 mg | Freq: Once | INTRAMUSCULAR | Status: AC
  Administered 2018-06-23: 30 mg via INTRAVENOUS
  Filled 2018-06-23: qty 1

## 2018-06-23 MED ORDER — ONDANSETRON HCL 4 MG/2ML IJ SOLN
4.0000 mg | Freq: Once | INTRAMUSCULAR | Status: AC
  Administered 2018-06-23: 4 mg via INTRAVENOUS
  Filled 2018-06-23: qty 2

## 2018-06-23 MED ORDER — SODIUM CHLORIDE 0.9 % IV BOLUS
1000.0000 mL | Freq: Once | INTRAVENOUS | Status: AC
Start: 2018-06-23 — End: 2018-06-23
  Administered 2018-06-23: 1000 mL via INTRAVENOUS

## 2018-06-23 MED ORDER — IOPAMIDOL (ISOVUE-300) INJECTION 61%
30.0000 mL | Freq: Once | INTRAVENOUS | Status: AC | PRN
  Administered 2018-06-23: 30 mL via ORAL

## 2018-06-23 MED ORDER — ONDANSETRON 4 MG PO TBDP: 4.0000 mg | ORAL_TABLET | Freq: Three times a day (TID) | ORAL | 0 refills | Status: DC | PRN

## 2018-06-23 MED ORDER — IOPAMIDOL (ISOVUE-300) INJECTION 61%
85.0000 mL | Freq: Once | INTRAVENOUS | Status: AC | PRN
  Administered 2018-06-23: 85 mL via INTRAVENOUS

## 2018-06-23 NOTE — ED Triage Notes (Signed)
Pt reports was here yesterday for another complaint and didn't say anything about this.

## 2018-06-23 NOTE — ED Triage Notes (Signed)
Pt reports CP and abd pain intermittently for a month.

## 2018-06-23 NOTE — ED Provider Notes (Addendum)
[facility] Emergency Department Provider Note  Time seen: 12:42 PM  I have reviewed the triage vital signs and the nursing notes.   HISTORY  Chief Complaint Chest Pain and Abdominal Pain    HPI Jeffrey Prince is a 52 y.o. male with a past medical history of gastric reflux, hypertension, and STEMI, presents to the emergency department for chest pain and abdominal pain.  According to the patient for the past 1 month he has been experiencing pain across his upper abdomen/lower chest.  Also states for the past 2 days nausea and vomiting unable to keep down fluids.  Denies diarrhea, last bowel movement was 2 days ago.  Denies fever.  Describes the pain as moderate 8/10 in severity across the upper abdomen currently.  Denies alcohol use.   Past Medical History:  Diagnosis Date  . Acne   . Anginal pain ([place])   . Blind left eye    Since birth  . Chorea   . Diverticulosis   . Dysrhythmia   . ED (erectile dysfunction)   . GERD (gastroesophageal reflux disease)   . Hemorrhoids   . Hypertension   . Weight loss    Abnormal    Patient Active Problem List   Diagnosis Date Noted  . Benign neoplasm of cecum   . Polyp of sigmoid colon   . Abnormal computed tomography of large intestine   . NSTEMI (non-ST elevated myocardial infarction) ([place]) 11/19/2017  . Coronary artery disease 11/03/2017  . ACS (acute coronary syndrome) ([place]) 10/31/2017  . Chest pain 10/30/2017  . Anal fissure   . Family history of Huntington's chorea 07/01/2017  . Unintentional weight loss 05/16/2015  . Diarrhea 05/16/2015  . GERD (gastroesophageal reflux disease) 05/16/2015  . Diffuse abdominal pain 05/16/2015  . Dyslipidemia (high LDL; low HDL) 01/25/2015  . Decreased visual acuity 01/24/2015  . Erectile dysfunction 01/24/2015  . Current every day smoker 01/24/2015  . 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 Hemorrhoids 03/16/2014    Past Surgical History:  Procedure  Laterality Date  . BOTOX INJECTION N/A 10/09/2016   Procedure: BOTOX INJECTION;  Surgeon: Jules Husbands, MD;  Location: [facility] ORS;  Service: General;  Laterality: N/A;  . BOTOX INJECTION N/A 07/23/2017   Procedure: BOTOX INJECTION;  Surgeon: Jules Husbands, MD;  Location: [facility];  Service: General;  Laterality: N/A;  . CARDIAC SURGERY    . COLONOSCOPY     with polyp removal  . COLONOSCOPY WITH PROPOFOL N/A 04/23/2018   Procedure: COLONOSCOPY WITH PROPOFOL;  Surgeon: Virgel Manifold, MD;  Location: [facility];  Service: Endoscopy;  Laterality: N/A;  . CORONARY ARTERY BYPASS GRAFT N/A 11/03/2017   Procedure: CORONARY ARTERY BYPASS GRAFTING (CABG) x four, using left internal mammry artery and right leg greater saphenous vein harvested endoscopically (LIMA to LAD, SVG to OM1, SVG to RAMUS INTERMEDIATE, SVG to PDA)  ;  Surgeon: Grace Isaac, MD;  Location: [place];  Service: Open Heart Surgery;  Laterality: N/A;  . EVALUATION UNDER ANESTHESIA WITH ANAL FISSUROTOMY N/A 10/09/2016   Procedure: EXAM UNDER ANESTHESIA WITH ANAL FISSUROTOMY chemical sphincterotomy w BOtox.;  Surgeon: Jules Husbands, MD;  Location: [facility];  Service: General;  Laterality: N/A;  Lithotomy, make sure botox is in the room  . EVALUATION UNDER ANESTHESIA WITH ANAL FISSUROTOMY N/A 07/23/2017   Procedure: EXAM UNDER ANESTHESIA WITH ANAL FISSUROTOMY, botox injection;  Surgeon: Jules Husbands, MD;  Location: [facility];  Service: General;  Laterality: N/A;  .  HEMORRHOID SURGERY    . LEFT HEART CATH AND CORONARY ANGIOGRAPHY Right 10/30/2017   Procedure: LEFT HEART CATH AND CORONARY ANGIOGRAPHY;  Surgeon: Dionisio David, MD;  Location: [place] [facility];  Service: Cardiovascular;  Laterality: Right;  . LEFT HEART CATH AND CORONARY ANGIOGRAPHY Right 11/20/2017   Procedure: LEFT HEART CATH AND CORONARY ANGIOGRAPHY;  Surgeon: Dionisio David, MD;  Location: [place] [facility];  Service: Cardiovascular;  Laterality: Right;  .  ROTATOR CUFF REPAIR  1991  . TEE WITHOUT CARDIOVERSION N/A 11/03/2017   Procedure: TRANSESOPHAGEAL ECHOCARDIOGRAM (TEE);  Surgeon: Grace Isaac, MD;  Location: [place];  Service: Open Heart Surgery;  Laterality: N/A;    Prior to Admission medications   Medication Sig Start Date End Date Taking? Authorizing Provider  aspirin EC 325 MG EC tablet Take 1 tablet (325 mg total) by mouth daily. Patient not taking: Reported on 06/23/2018 11/08/17   Elgie Collard, PA-C  atorvastatin (LIPITOR) 40 MG tablet Take 1 tablet (40 mg total) by mouth daily. Patient not taking: Reported on 06/23/2018 11/08/17   Elgie Collard, PA-C  dicyclomine (BENTYL) 20 MG tablet Take 1 tablet (20 mg total) by mouth 2 (two) times daily. Patient not taking: Reported on 06/23/2018 05/28/18   Kinnie Feil, PA-C  diphenoxylate-atropine (LOMOTIL) 2.5-0.025 MG tablet Take 1 tablet by mouth 4 (four) times daily as needed for diarrhea or loose stools. Patient not taking: Reported on 04/10/2018 03/16/18 03/16/19  Orbie Pyo, MD  furosemide (LASIX) 40 MG tablet Take 1 tablet (40 mg total) by mouth daily for 5 days. Patient not taking: Reported on 05/28/2018 11/08/17 11/13/17  Elgie Collard, PA-C  isosorbide mononitrate (IMDUR) 30 MG 24 hr tablet Take 1 tablet (30 mg total) by mouth daily. Patient not taking: Reported on 06/23/2018 11/20/17   Henreitta Leber, MD  lidocaine (LIDODERM) 5 % Place 1 patch onto the skin every 12 (twelve) hours. Remove & Discard patch within 12 hours or as directed by MD Patient not taking: Reported on 05/28/2018 04/03/18 04/03/19  Merlyn Lot, MD  metoprolol tartrate (LOPRESSOR) 25 MG tablet Take 0.5 tablets (12.5 mg total) by mouth 2 (two) times daily. Patient not taking: Reported on 05/28/2018 11/08/17   Elgie Collard, PA-C  naproxen (NAPROSYN) 500 MG tablet Take 1 tablet (500 mg total) by mouth 2 (two) times daily with a meal. Patient not taking: Reported on 06/23/2018 12/26/17   Lavonia Drafts,  MD  oxyCODONE-acetaminophen (PERCOCET) 5-325 MG tablet Take 1 tablet by mouth every 8 (eight) hours as needed. Patient not taking: Reported on 06/23/2018 05/18/18   Earleen Newport, MD  polyethylene glycol-electrolytes (NULYTELY/GOLYTELY) 420 g solution As directed Patient not taking: Reported on 05/28/2018 04/10/18   Virgel Manifold, MD  potassium chloride SA (K-DUR,KLOR-CON) 20 MEQ tablet Take 1 tablet (20 mEq total) by mouth daily for 5 days. Patient not taking: Reported on 05/28/2018 11/08/17 11/19/17  Elgie Collard, PA-C  simethicone (MYLICON) 80 MG chewable tablet Chew 1 tablet (80 mg total) by mouth 4 (four) times daily as needed for flatulence. Patient not taking: Reported on 04/10/2018 11/08/17   Elgie Collard, PA-C  traMADol (ULTRAM) 50 MG tablet Take 1 tablet (50 mg total) by mouth every 6 (six) hours as needed. Patient not taking: Reported on 05/28/2018 04/03/18 04/03/19  Merlyn Lot, MD  triamcinolone ointment (KENALOG) 0.5 % Apply 1 application topically 2 (two) times daily. Patient not taking: Reported on 05/28/2018 05/25/18  Rudene Re, MD    Allergies  Allergen Reactions  . Bee Venom Swelling    SWELLING REACTION UNSPECIFIED  SEVERITY RATED HIGH FROM PMH  . Codeine Diarrhea, Nausea Only and Rash  . Sulfa Antibiotics Nausea And Vomiting    INCLUDES SPECIFICALLY SULFASALAZINE    Family History  Problem Relation Age of Onset  . Breast cancer Mother   . Huntington's disease Father   . Cancer Paternal Uncle        Colon  . Diabetes Son   . Cancer Maternal Grandfather   . Cancer Paternal Grandmother   . Heart disease Paternal Grandmother   . Huntington's disease Paternal Grandmother     Social History Social History   Tobacco Use  . Smoking status: Former Smoker    Packs/day: 0.50    Years: 20.00    Pack years: 10.00    Types: Cigarettes    Last attempt to quit: 06/16/2017    Years since quitting: 1.0  . Smokeless tobacco: Never Used  Substance Use  Topics  . Alcohol use: No    Alcohol/week: 0.0 standard drinks  . Drug use: No    Review of Systems Constitutional: Negative for fever. Cardiovascular: Mild lower chest pain Respiratory: Negative for shortness of breath. Gastrointestinal: Moderate upper abdominal pain positive for nausea vomiting.  2 days of constipation. Genitourinary: Negative for urinary compaints Musculoskeletal: Negative for musculoskeletal complaints Skin: Negative for skin complaints  Neurological: Negative for headache All other ROS negative  ____________________________________________   PHYSICAL EXAM:  VITAL SIGNS: ED Triage Vitals  Enc Vitals Group     BP 06/23/18 0922 (!) 124/98     Pulse Rate 06/23/18 0922 92     Resp 06/23/18 0922 18     Temp 06/23/18 0922 98 F (36.7 C)     Temp Source 06/23/18 0922 Oral     SpO2 06/23/18 0922 100 %     Weight 06/23/18 0921 128 lb (58.1 kg)     Height 06/23/18 0921 5\' 1"  (1.549 m)     Head Circumference --      Peak Flow --      Pain Score 06/23/18 0920 8     Pain Loc --      Pain Edu? --      Excl. in [place]? --     Constitutional: Alert and oriented. Well appearing and in no distress. Eyes: Normal exam ENT   Head: Normocephalic and atraumatic.   Mouth/Throat: Mucous membranes are moist. Cardiovascular: Normal rate, regular rhythm. No murmur Respiratory: Normal respiratory effort without tachypnea nor retractions. Breath sounds are clear  Gastrointestinal: Soft, mild epigastric tenderness.  No rebound guarding or distention Musculoskeletal: Nontender with normal range of motion in all extremities.  Neurologic:  Normal speech and language. No gross focal neurologic deficits Skin:  Skin is warm, dry and intact.  Psychiatric: Mood and affect are normal.   ____________________________________________    EKG  EKG reviewed and interpreted by myself shows normal sinus rhythm at 91 bpm with a narrow QRS, normal axis, normal intervals, nonspecific  but no concerning ST changes.  ____________________________________________    RADIOLOGY  Chest x-ray shows chronic changes no acute abnormality.  ____________________________________________   INITIAL IMPRESSION / ASSESSMENT AND PLAN / ED COURSE  Pertinent labs & imaging results that were available during my care of the patient were reviewed by me and considered in my medical decision making (see chart for details).  She presents to the emergency department for lower chest/upper  abdominal discomfort intermittent over the past 1 month and now with vomiting x2 days.  Patient's basic labs including troponin are normal.  We will add on hepatic function panel and lipase.  Given the patient's complaint of pain we will proceed with CT imaging to further evaluate.  Patient agreeable to plan of care.  CT scan is pending.  Labs are resulted normal/baseline for the patient.    CT scan is negative.  Patient will be discharged from the emergency department.  ____________________________________________   FINAL CLINICAL IMPRESSION(S) / ED DIAGNOSES  chest pain abdominal pain   Harvest Dark, MD 06/23/18 1505    Harvest Dark, MD 06/23/18 [phone]

## 2018-06-28 ENCOUNTER — Other Ambulatory Visit: Payer: Self-pay

## 2018-06-28 ENCOUNTER — Emergency Department
Admission: EM | Admit: 2018-06-28 | Discharge: 2018-06-28 | Disposition: A | Discharge status: Home / Self Care | Payer: Medicaid Other | Attending: Emergency Medicine | Admitting: Emergency Medicine

## 2018-06-28 ENCOUNTER — Encounter: Payer: Self-pay | Admitting: *Deleted

## 2018-06-28 DIAGNOSIS — R112 Nausea with vomiting, unspecified: Secondary | ICD-10-CM | POA: Diagnosis present

## 2018-06-28 DIAGNOSIS — I2510 Atherosclerotic heart disease of native coronary artery without angina pectoris: Secondary | ICD-10-CM | POA: Diagnosis not present

## 2018-06-28 DIAGNOSIS — I252 Old myocardial infarction: Secondary | ICD-10-CM | POA: Insufficient documentation

## 2018-06-28 DIAGNOSIS — I10 Essential (primary) hypertension: Secondary | ICD-10-CM | POA: Diagnosis not present

## 2018-06-28 DIAGNOSIS — E785 Hyperlipidemia, unspecified: Secondary | ICD-10-CM | POA: Insufficient documentation

## 2018-06-28 DIAGNOSIS — Z87891 Personal history of nicotine dependence: Secondary | ICD-10-CM | POA: Insufficient documentation

## 2018-06-28 LAB — COMPREHENSIVE METABOLIC PANEL
ALK PHOS: 51 U/L (ref 38–126)
ALT: 22 U/L (ref 0–44)
ANION GAP: 5 (ref 5–15)
AST: 25 U/L (ref 15–41)
Albumin: 3.7 g/dL (ref 3.5–5.0)
BILIRUBIN TOTAL: 1.2 mg/dL (ref 0.3–1.2)
BUN: 9 mg/dL (ref 6–20)
CALCIUM: 8.5 mg/dL — AB (ref 8.9–10.3)
CO2: 29 mmol/L (ref 22–32)
Chloride: 102 mmol/L (ref 98–111)
Creatinine, Ser: 0.99 mg/dL (ref 0.61–1.24)
GFR calc non Af Amer: >60 mL/min (ref >60)
Glucose, Bld: 143 mg/dL — ABNORMAL HIGH (ref 70–99)
Potassium: 3.2 mmol/L — ABNORMAL LOW (ref 3.5–5.1)
SODIUM: 136 mmol/L (ref 135–145)
TOTAL PROTEIN: 6.9 g/dL (ref 6.5–8.1)

## 2018-06-28 LAB — CBC
HEMATOCRIT: 41.2 % (ref 40.0–52.0)
HEMOGLOBIN: 14.7 g/dL (ref 13.0–18.0)
MCH: 30.1 pg (ref 26.0–34.0)
MCHC: 35.7 g/dL (ref 32.0–36.0)
MCV: 84.3 fL (ref 80.0–100.0)
Platelets: 284 10*3/uL (ref 150–440)
RBC: 4.89 MIL/uL (ref 4.40–5.90)
RDW: 14.2 % (ref 11.5–14.5)
WBC: 3.2 10*3/uL — ABNORMAL LOW (ref 3.8–10.6)

## 2018-06-28 LAB — LIPASE, BLOOD: Lipase: 25 U/L (ref 11–51)

## 2018-06-28 MED ORDER — LORAZEPAM 2 MG/ML IJ SOLN
0.5000 mg | Freq: Once | INTRAMUSCULAR | Status: AC
  Administered 2018-06-28: 0.5 mg via INTRAVENOUS

## 2018-06-28 MED ORDER — ONDANSETRON HCL 4 MG/2ML IJ SOLN
4.0000 mg | Freq: Once | INTRAMUSCULAR | Status: AC
  Administered 2018-06-28: 4 mg via INTRAVENOUS
  Filled 2018-06-28: qty 2

## 2018-06-28 MED ORDER — GI COCKTAIL ~~LOC~~
30.0000 mL | Freq: Once | ORAL | Status: AC
  Administered 2018-06-28: 30 mL via ORAL
  Filled 2018-06-28: qty 30

## 2018-06-28 MED ORDER — ONDANSETRON 4 MG PO TBDP: 4.0000 mg | ORAL_TABLET | Freq: Three times a day (TID) | ORAL | 0 refills | Status: DC | PRN

## 2018-06-28 MED ORDER — METOCLOPRAMIDE HCL 10 MG PO TABS: 10.0000 mg | ORAL_TABLET | Freq: Four times a day (QID) | ORAL | 0 refills | Status: DC | PRN

## 2018-06-28 MED ORDER — LORAZEPAM 2 MG/ML IJ SOLN
INTRAMUSCULAR | Status: AC
  Administered 2018-06-28: 0.5 mg via INTRAVENOUS
  Filled 2018-06-28: qty 1

## 2018-06-28 MED ORDER — SODIUM CHLORIDE 0.9 % IV BOLUS
1000.0000 mL | Freq: Once | INTRAVENOUS | Status: AC
  Administered 2018-06-28: 1000 mL via INTRAVENOUS

## 2018-06-28 NOTE — ED Provider Notes (Addendum)
[facility] Emergency Department Provider Note  Time seen: 5:12 PM  I have reviewed the triage vital signs and the nursing notes.   HISTORY  Chief Complaint Emesis    HPI Jeffrey Prince is a 52 y.o. male with a past medical history of gastric reflux, hypertension, presents to the emergency department for nausea vomiting.  According to the patient for the past 3 days he has been very nauseated and vomiting is not been able to eat or drink nearly anything per patient.  Denies any fever.  Denies any abdominal pain.  Patient was seen in the emergency department 5 days ago for abdominal pain and chest pain, I personally saw the patient at that time had a normal work-up with the patient was discharged home.  States he was able to make an appointment with GI medicine with Dr. Enriqueta Shutter but is not until September.  States because he was vomiting today he came back to the emergency department for evaluation.   Past Medical History:  Diagnosis Date  . Acne   . Anginal pain ([place])   . Blind left eye    Since birth  . Chorea   . Diverticulosis   . Dysrhythmia   . ED (erectile dysfunction)   . GERD (gastroesophageal reflux disease)   . Hemorrhoids   . Hypertension   . Weight loss    Abnormal    Patient Active Problem List   Diagnosis Date Noted  . Benign neoplasm of cecum   . Polyp of sigmoid colon   . Abnormal computed tomography of large intestine   . NSTEMI (non-ST elevated myocardial infarction) ([place]) 11/19/2017  . Coronary artery disease 11/03/2017  . ACS (acute coronary syndrome) ([place]) 10/31/2017  . Chest pain 10/30/2017  . Anal fissure   . Family history of Huntington's chorea 07/01/2017  . Unintentional weight loss 05/16/2015  . Diarrhea 05/16/2015  . GERD (gastroesophageal reflux disease) 05/16/2015  . Diffuse abdominal pain 05/16/2015  . Dyslipidemia (high LDL; low HDL) 01/25/2015  . Decreased visual acuity 01/24/2015  . Erectile dysfunction  01/24/2015  . Current every day smoker 01/24/2015  . 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 Hemorrhoids 03/16/2014    Past Surgical History:  Procedure Laterality Date  . BOTOX INJECTION N/A 10/09/2016   Procedure: BOTOX INJECTION;  Surgeon: Jules Husbands, MD;  Location: [facility];  Service: General;  Laterality: N/A;  . BOTOX INJECTION N/A 07/23/2017   Procedure: BOTOX INJECTION;  Surgeon: Jules Husbands, MD;  Location: [facility];  Service: General;  Laterality: N/A;  . CARDIAC SURGERY    . COLONOSCOPY     with polyp removal  . COLONOSCOPY WITH PROPOFOL N/A 04/23/2018   Procedure: COLONOSCOPY WITH PROPOFOL;  Surgeon: Virgel Manifold, MD;  Location: [facility];  Service: Endoscopy;  Laterality: N/A;  . CORONARY ARTERY BYPASS GRAFT N/A 11/03/2017   Procedure: CORONARY ARTERY BYPASS GRAFTING (CABG) x four, using left internal mammry artery and right leg greater saphenous vein harvested endoscopically (LIMA to LAD, SVG to OM1, SVG to RAMUS INTERMEDIATE, SVG to PDA)  ;  Surgeon: Grace Isaac, MD;  Location: [place];  Service: Open Heart Surgery;  Laterality: N/A;  . EVALUATION UNDER ANESTHESIA WITH ANAL FISSUROTOMY N/A 10/09/2016   Procedure: EXAM UNDER ANESTHESIA WITH ANAL FISSUROTOMY chemical sphincterotomy w BOtox.;  Surgeon: Jules Husbands, MD;  Location: [facility];  Service: General;  Laterality: N/A;  Lithotomy, make sure botox is in the room  . EVALUATION UNDER  ANESTHESIA WITH ANAL FISSUROTOMY N/A 07/23/2017   Procedure: EXAM UNDER ANESTHESIA WITH ANAL FISSUROTOMY, botox injection;  Surgeon: Jules Husbands, MD;  Location: [facility];  Service: General;  Laterality: N/A;  . HEMORRHOID SURGERY    . LEFT HEART CATH AND CORONARY ANGIOGRAPHY Right 10/30/2017   Procedure: LEFT HEART CATH AND CORONARY ANGIOGRAPHY;  Surgeon: Dionisio David, MD;  Location: [place] [facility];  Service: Cardiovascular;  Laterality: Right;  . LEFT HEART CATH AND CORONARY ANGIOGRAPHY Right 11/20/2017    Procedure: LEFT HEART CATH AND CORONARY ANGIOGRAPHY;  Surgeon: Dionisio David, MD;  Location: [place] [facility];  Service: Cardiovascular;  Laterality: Right;  . ROTATOR CUFF REPAIR  1991  . TEE WITHOUT CARDIOVERSION N/A 11/03/2017   Procedure: TRANSESOPHAGEAL ECHOCARDIOGRAM (TEE);  Surgeon: Grace Isaac, MD;  Location: [place];  Service: Open Heart Surgery;  Laterality: N/A;    Prior to Admission medications   Medication Sig Start Date End Date Taking? Authorizing Provider  aspirin EC 325 MG EC tablet Take 1 tablet (325 mg total) by mouth daily. Patient not taking: Reported on 06/23/2018 11/08/17   Elgie Collard, PA-C  atorvastatin (LIPITOR) 40 MG tablet Take 1 tablet (40 mg total) by mouth daily. Patient not taking: Reported on 06/23/2018 11/08/17   Elgie Collard, PA-C  dicyclomine (BENTYL) 20 MG tablet Take 1 tablet (20 mg total) by mouth 2 (two) times daily. Patient not taking: Reported on 06/23/2018 05/28/18   Kinnie Feil, PA-C  diphenoxylate-atropine (LOMOTIL) 2.5-0.025 MG tablet Take 1 tablet by mouth 4 (four) times daily as needed for diarrhea or loose stools. Patient not taking: Reported on 04/10/2018 03/16/18 03/16/19  Orbie Pyo, MD  furosemide (LASIX) 40 MG tablet Take 1 tablet (40 mg total) by mouth daily for 5 days. Patient not taking: Reported on 05/28/2018 11/08/17 11/13/17  Elgie Collard, PA-C  isosorbide mononitrate (IMDUR) 30 MG 24 hr tablet Take 1 tablet (30 mg total) by mouth daily. Patient not taking: Reported on 06/23/2018 11/20/17   Henreitta Leber, MD  lidocaine (LIDODERM) 5 % Place 1 patch onto the skin every 12 (twelve) hours. Remove & Discard patch within 12 hours or as directed by MD Patient not taking: Reported on 05/28/2018 04/03/18 04/03/19  Merlyn Lot, MD  metoprolol tartrate (LOPRESSOR) 25 MG tablet Take 0.5 tablets (12.5 mg total) by mouth 2 (two) times daily. Patient not taking: Reported on 05/28/2018 11/08/17   Elgie Collard, PA-C   naproxen (NAPROSYN) 500 MG tablet Take 1 tablet (500 mg total) by mouth 2 (two) times daily with a meal. Patient not taking: Reported on 06/23/2018 12/26/17   Lavonia Drafts, MD  ondansetron (ZOFRAN ODT) 4 MG disintegrating tablet Take 1 tablet (4 mg total) by mouth every 8 (eight) hours as needed for nausea or vomiting. 06/23/18   Harvest Dark, MD  oxyCODONE-acetaminophen (PERCOCET) 5-325 MG tablet Take 1 tablet by mouth every 8 (eight) hours as needed. Patient not taking: Reported on 06/23/2018 05/18/18   Earleen Newport, MD  polyethylene glycol-electrolytes (NULYTELY/GOLYTELY) 420 g solution As directed Patient not taking: Reported on 05/28/2018 04/10/18   Virgel Manifold, MD  potassium chloride SA (K-DUR,KLOR-CON) 20 MEQ tablet Take 1 tablet (20 mEq total) by mouth daily for 5 days. Patient not taking: Reported on 05/28/2018 11/08/17 11/19/17  Elgie Collard, PA-C  simethicone (MYLICON) 80 MG chewable tablet Chew 1 tablet (80 mg total) by mouth 4 (four) times daily as needed for flatulence.  Patient not taking: Reported on 04/10/2018 11/08/17   Elgie Collard, PA-C  traMADol (ULTRAM) 50 MG tablet Take 1 tablet (50 mg total) by mouth every 6 (six) hours as needed. Patient not taking: Reported on 05/28/2018 04/03/18 04/03/19  Merlyn Lot, MD  triamcinolone ointment (KENALOG) 0.5 % Apply 1 application topically 2 (two) times daily. Patient not taking: Reported on 05/28/2018 05/25/18   Rudene Re, MD    Allergies  Allergen Reactions  . Bee Venom Swelling    SWELLING REACTION UNSPECIFIED  SEVERITY RATED HIGH FROM PMH  . Codeine Diarrhea, Nausea Only and Rash  . Sulfa Antibiotics Nausea And Vomiting    INCLUDES SPECIFICALLY SULFASALAZINE    Family History  Problem Relation Age of Onset  . Breast cancer Mother   . Huntington's disease Father   . Cancer Paternal Uncle        Colon  . Diabetes Son   . Cancer Maternal Grandfather   . Cancer Paternal Grandmother   . Heart  disease Paternal Grandmother   . Huntington's disease Paternal Grandmother     Social History Social History   Tobacco Use  . Smoking status: Former Smoker    Packs/day: 0.50    Years: 20.00    Pack years: 10.00    Types: Cigarettes    Last attempt to quit: 06/16/2017    Years since quitting: 1.0  . Smokeless tobacco: Never Used  Substance Use Topics  . Alcohol use: No    Alcohol/week: 0.0 standard drinks  . Drug use: No    Review of Systems Constitutional: Negative for fever. Cardiovascular: Negative for chest pain. Respiratory: Negative for shortness of breath. Gastrointestinal: Denies abdominal pain.  Positive for nausea and vomiting. Genitourinary: Negative for urinary compaints Musculoskeletal: Negative for musculoskeletal complaints Skin: Negative for skin complaints  Neurological: Negative for headache All other ROS negative  ____________________________________________   PHYSICAL EXAM:  VITAL SIGNS: ED Triage Vitals  Enc Vitals Group     BP 06/28/18 1659 138/88     Pulse Rate 06/28/18 1659 69     Resp --      Temp 06/28/18 1659 98 F (36.7 C)     Temp Source 06/28/18 1659 Oral     SpO2 06/28/18 1658 95 %     Weight 06/28/18 1701 128 lb (58.1 kg)     Height 06/28/18 1701 5\' 10"  (1.778 m)     Head Circumference --      Peak Flow --      Pain Score 06/28/18 1701 7     Pain Loc --      Pain Edu? --      Excl. in [place]? --    Constitutional: Alert and oriented. Well appearing and in no distress. Eyes: Normal exam ENT   Head: Normocephalic and atraumatic.   Mouth/Throat: Mucous membranes are moist. Cardiovascular: Normal rate, regular rhythm. No murmur Respiratory: Normal respiratory effort without tachypnea nor retractions. Breath sounds are clear  Gastrointestinal: Soft and nontender. No distention.  Thin abdomen. Musculoskeletal: Nontender with normal range of motion in all extremities.  Neurologic:  Normal speech and language. No gross focal  neurologic deficits  Skin:  Skin is warm, dry and intact.  Psychiatric: Mood and affect are normal. Speech and behavior are normal.   ____________________________________________   INITIAL IMPRESSION / ASSESSMENT AND PLAN / ED COURSE  Pertinent labs & imaging results that were available during my care of the patient were reviewed by me and considered in my medical  decision making (see chart for details).  Patient presents to the emergency department with concerns of her nausea vomiting over the past 2 to 3 days as well as intermittent loose stool today as well.  Denies any abdominal pain.  States he feels a sensation of generalized fatigue and weakness, believes he is dehydrated.  I saw the patient 5 days ago in the emergency department that time he is complaining of chest abdominal pain, had a largely negative work-up including lab work and a CT scan of the abdomen/pelvis.  Overall the patient appears well with a nontender abdomen reassuring vitals.  We will check labs, IV hydrate and treat nausea in the emergency department.  Patient agreeable to plan of care.  Patient's labs are resulted largely within normal limits.  No significant abnormality.  He is feeling better after IV fluids and nausea medication.  We will discharge from the emergency department with PCP follow-up.  EKG reviewed and interpreted by myself shows normal sinus rhythm at 61 bpm with a narrow QRS, normal axis, normal intervals, likely LVH, nonspecific ST changes.   ____________________________________________   FINAL CLINICAL IMPRESSION(S) / ED DIAGNOSES  Nausea vomiting    Harvest Dark, MD 06/28/18 2135    Harvest Dark, MD 06/28/18 2229

## 2018-06-28 NOTE — ED Notes (Signed)
Pt tremulous, report hx of Huntington's, requests medicine for hx of "reflux", EDP notified, orders rx'd

## 2018-06-28 NOTE — ED Triage Notes (Signed)
Pt arrived via ems c/o nausea vomiting diarrhea   Over last couple  Days . Vomited last  Two loose stools over last 24 hours. Decreased appetite  Weak and gets  Shortness of breath on exertion

## 2018-06-28 NOTE — ED Notes (Signed)
Pt slightly restless   Asking for something for indigestion  Remains   awake  Iv infusing well side rails are elevated

## 2018-06-28 NOTE — ED Notes (Signed)
Attempted to obtain urine sample  Pt unable  To urinate

## 2018-06-28 NOTE — ED Triage Notes (Signed)
Pt seen er 5 days ago  Had ct scan

## 2018-06-28 NOTE — ED Notes (Signed)
Pt in room appears to be dry heaving, or trying to cough hard, given glass of ice water, no vomiting noted since arrival to ED. Pt very soft spoken. Encouraged patient to try to lay back and relax.

## 2018-07-12 ENCOUNTER — Encounter: Payer: Self-pay | Admitting: Emergency Medicine

## 2018-07-12 ENCOUNTER — Emergency Department
Admission: EM | Admit: 2018-07-12 | Discharge: 2018-07-12 | Disposition: A | Discharge status: Home / Self Care | Payer: Medicaid Other | Attending: Emergency Medicine | Admitting: Emergency Medicine

## 2018-07-12 ENCOUNTER — Other Ambulatory Visit: Payer: Self-pay

## 2018-07-12 DIAGNOSIS — I10 Essential (primary) hypertension: Secondary | ICD-10-CM | POA: Diagnosis not present

## 2018-07-12 DIAGNOSIS — I252 Old myocardial infarction: Secondary | ICD-10-CM | POA: Diagnosis not present

## 2018-07-12 DIAGNOSIS — E785 Hyperlipidemia, unspecified: Secondary | ICD-10-CM | POA: Insufficient documentation

## 2018-07-12 DIAGNOSIS — Z87891 Personal history of nicotine dependence: Secondary | ICD-10-CM | POA: Insufficient documentation

## 2018-07-12 DIAGNOSIS — R112 Nausea with vomiting, unspecified: Secondary | ICD-10-CM | POA: Diagnosis not present

## 2018-07-12 LAB — COMPREHENSIVE METABOLIC PANEL
ALBUMIN: 4.2 g/dL (ref 3.5–5.0)
ALK PHOS: 59 U/L (ref 38–126)
ALT: 136 U/L — AB (ref 0–44)
AST: 82 U/L — AB (ref 15–41)
Anion gap: 5 (ref 5–15)
BILIRUBIN TOTAL: 0.9 mg/dL (ref 0.3–1.2)
BUN: 10 mg/dL (ref 6–20)
CO2: 28 mmol/L (ref 22–32)
CREATININE: 0.71 mg/dL (ref 0.61–1.24)
Calcium: 9.5 mg/dL (ref 8.9–10.3)
Chloride: 99 mmol/L (ref 98–111)
GFR calc Af Amer: >60 mL/min (ref >60)
GFR calc non Af Amer: >60 mL/min (ref >60)
GLUCOSE: 96 mg/dL (ref 70–99)
POTASSIUM: 4.8 mmol/L (ref 3.5–5.1)
Sodium: 132 mmol/L — ABNORMAL LOW (ref 135–145)
TOTAL PROTEIN: 8 g/dL (ref 6.5–8.1)

## 2018-07-12 LAB — URINALYSIS, COMPLETE (UACMP) WITH MICROSCOPIC
Bacteria, UA: NONE SEEN
Bilirubin Urine: NEGATIVE
GLUCOSE, UA: NEGATIVE mg/dL
Hgb urine dipstick: NEGATIVE
Ketones, ur: NEGATIVE mg/dL
Leukocytes, UA: NEGATIVE
Nitrite: NEGATIVE
PH: 6 (ref 5.0–8.0)
Protein, ur: NEGATIVE mg/dL
Specific Gravity, Urine: 1.005 (ref 1.005–1.030)
WBC, UA: NONE SEEN WBC/hpf (ref 0–5)

## 2018-07-12 LAB — CBC
HEMATOCRIT: 45.6 % (ref 40.0–52.0)
Hemoglobin: 15.8 g/dL (ref 13.0–18.0)
MCH: 29.8 pg (ref 26.0–34.0)
MCHC: 34.7 g/dL (ref 32.0–36.0)
MCV: 85.9 fL (ref 80.0–100.0)
Platelets: 308 10*3/uL (ref 150–440)
RBC: 5.3 MIL/uL (ref 4.40–5.90)
RDW: 14.3 % (ref 11.5–14.5)
WBC: 3.1 10*3/uL — ABNORMAL LOW (ref 3.8–10.6)

## 2018-07-12 LAB — LIPASE, BLOOD: Lipase: 36 U/L (ref 11–51)

## 2018-07-12 MED ORDER — METOCLOPRAMIDE HCL 5 MG/ML IJ SOLN
10.0000 mg | Freq: Once | INTRAMUSCULAR | Status: AC
  Administered 2018-07-12: 10 mg via INTRAVENOUS
  Filled 2018-07-12: qty 2

## 2018-07-12 MED ORDER — SODIUM CHLORIDE 0.9 % IV BOLUS
1000.0000 mL | Freq: Once | INTRAVENOUS | Status: AC
  Administered 2018-07-12: 1000 mL via INTRAVENOUS

## 2018-07-12 MED ORDER — PROMETHAZINE HCL 12.5 MG PO TABS: 12.5000 mg | ORAL_TABLET | Freq: Four times a day (QID) | ORAL | 0 refills | Status: AC | PRN

## 2018-07-12 NOTE — ED Triage Notes (Addendum)
Pt presents via EMS with c/o nausea and vomiting; seen here on 06/28/18 for same symptoms and says the medicine he was given for nausea has not been working; pt reports some abd pain; denies urinary s/s; pt says he feels wheezy-lungs clear in triage; pt reports decreased appetite

## 2018-07-12 NOTE — ED Notes (Signed)
Patient IV infiltrated. Swelling noted to left upper arm. Dr. Owens Shark notified. Ice pack applied.

## 2018-07-12 NOTE — ED Provider Notes (Signed)
[facility] Emergency Department Provider Note   First MD Initiated Contact with Patient 07/12/18 [phone]     (approximate)  I have reviewed the triage vital signs and the nursing notes.   HISTORY  Chief Complaint Emesis    HPI Jeffrey Prince is a 52 y.o. male with below list of chronic medical conditions including chronic abdominal pain and nausea and vomiting returns to the emergency department with continued nausea and emesis.  Patient states that he had 2 episodes of nonbloody nonbilious emesis today.  Patient denies any abdominal pain at present.  Patient denies any diarrhea constipation.  Patient denies any urinary symptoms.  Patient denies any fever afebrile on presentation.   Past Medical History:  Diagnosis Date  . Acne   . Anginal pain ([place])   . Blind left eye    Since birth  . Chorea   . Diverticulosis   . Dysrhythmia   . ED (erectile dysfunction)   . GERD (gastroesophageal reflux disease)   . Hemorrhoids   . Hypertension   . Weight loss    Abnormal    Patient Active Problem List   Diagnosis Date Noted  . Benign neoplasm of cecum   . Polyp of sigmoid colon   . Abnormal computed tomography of large intestine   . NSTEMI (non-ST elevated myocardial infarction) ([place]) 11/19/2017  . Coronary artery disease 11/03/2017  . ACS (acute coronary syndrome) ([place]) 10/31/2017  . Chest pain 10/30/2017  . Anal fissure   . Family history of Huntington's chorea 07/01/2017  . Unintentional weight loss 05/16/2015  . Diarrhea 05/16/2015  . GERD (gastroesophageal reflux disease) 05/16/2015  . Diffuse abdominal pain 05/16/2015  . Dyslipidemia (high LDL; low HDL) 01/25/2015  . Decreased visual acuity 01/24/2015  . Erectile dysfunction 01/24/2015  . Current every day smoker 01/24/2015  . 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 Hemorrhoids 03/16/2014    Past Surgical History:  Procedure Laterality Date  . BOTOX INJECTION N/A 10/09/2016   Procedure: BOTOX INJECTION;  Surgeon: Jules Husbands, MD;  Location: [facility];  Service: General;  Laterality: N/A;  . BOTOX INJECTION N/A 07/23/2017   Procedure: BOTOX INJECTION;  Surgeon: Jules Husbands, MD;  Location: [facility];  Service: General;  Laterality: N/A;  . CARDIAC SURGERY    . COLONOSCOPY     with polyp removal  . COLONOSCOPY WITH PROPOFOL N/A 04/23/2018   Procedure: COLONOSCOPY WITH PROPOFOL;  Surgeon: Virgel Manifold, MD;  Location: [facility];  Service: Endoscopy;  Laterality: N/A;  . CORONARY ARTERY BYPASS GRAFT N/A 11/03/2017   Procedure: CORONARY ARTERY BYPASS GRAFTING (CABG) x four, using left internal mammry artery and right leg greater saphenous vein harvested endoscopically (LIMA to LAD, SVG to OM1, SVG to RAMUS INTERMEDIATE, SVG to PDA)  ;  Surgeon: Grace Isaac, MD;  Location: [place];  Service: Open Heart Surgery;  Laterality: N/A;  . EVALUATION UNDER ANESTHESIA WITH ANAL FISSUROTOMY N/A 10/09/2016   Procedure: EXAM UNDER ANESTHESIA WITH ANAL FISSUROTOMY chemical sphincterotomy w BOtox.;  Surgeon: Jules Husbands, MD;  Location: [facility];  Service: General;  Laterality: N/A;  Lithotomy, make sure botox is in the room  . EVALUATION UNDER ANESTHESIA WITH ANAL FISSUROTOMY N/A 07/23/2017   Procedure: EXAM UNDER ANESTHESIA WITH ANAL FISSUROTOMY, botox injection;  Surgeon: Jules Husbands, MD;  Location: [facility];  Service: General;  Laterality: N/A;  . HEMORRHOID SURGERY    . LEFT HEART CATH AND CORONARY ANGIOGRAPHY Right 10/30/2017  Procedure: LEFT HEART CATH AND CORONARY ANGIOGRAPHY;  Surgeon: Dionisio David, MD;  Location: [place] [facility];  Service: Cardiovascular;  Laterality: Right;  . LEFT HEART CATH AND CORONARY ANGIOGRAPHY Right 11/20/2017   Procedure: LEFT HEART CATH AND CORONARY ANGIOGRAPHY;  Surgeon: Dionisio David, MD;  Location: [place] [facility];  Service: Cardiovascular;  Laterality: Right;  . ROTATOR CUFF REPAIR  1991  . TEE WITHOUT  CARDIOVERSION N/A 11/03/2017   Procedure: TRANSESOPHAGEAL ECHOCARDIOGRAM (TEE);  Surgeon: Grace Isaac, MD;  Location: [place];  Service: Open Heart Surgery;  Laterality: N/A;    Prior to Admission medications   Medication Sig Start Date End Date Taking? Authorizing Provider  aspirin EC 325 MG EC tablet Take 1 tablet (325 mg total) by mouth daily. Patient not taking: Reported on 06/23/2018 11/08/17   Elgie Collard, PA-C  atorvastatin (LIPITOR) 40 MG tablet Take 1 tablet (40 mg total) by mouth daily. Patient not taking: Reported on 06/23/2018 11/08/17   Elgie Collard, PA-C  dicyclomine (BENTYL) 20 MG tablet Take 1 tablet (20 mg total) by mouth 2 (two) times daily. Patient not taking: Reported on 06/23/2018 05/28/18   Kinnie Feil, PA-C  diphenoxylate-atropine (LOMOTIL) 2.5-0.025 MG tablet Take 1 tablet by mouth 4 (four) times daily as needed for diarrhea or loose stools. Patient not taking: Reported on 04/10/2018 03/16/18 03/16/19  Orbie Pyo, MD  furosemide (LASIX) 40 MG tablet Take 1 tablet (40 mg total) by mouth daily for 5 days. Patient not taking: Reported on 05/28/2018 11/08/17 11/13/17  Elgie Collard, PA-C  isosorbide mononitrate (IMDUR) 30 MG 24 hr tablet Take 1 tablet (30 mg total) by mouth daily. Patient not taking: Reported on 06/23/2018 11/20/17   Henreitta Leber, MD  lidocaine (LIDODERM) 5 % Place 1 patch onto the skin every 12 (twelve) hours. Remove & Discard patch within 12 hours or as directed by MD Patient not taking: Reported on 05/28/2018 04/03/18 04/03/19  Merlyn Lot, MD  metoCLOPramide (REGLAN) 10 MG tablet Take 1 tablet (10 mg total) by mouth every 6 (six) hours as needed for nausea. 06/28/18   Harvest Dark, MD  metoprolol tartrate (LOPRESSOR) 25 MG tablet Take 0.5 tablets (12.5 mg total) by mouth 2 (two) times daily. Patient not taking: Reported on 05/28/2018 11/08/17   Elgie Collard, PA-C  naproxen (NAPROSYN) 500 MG tablet Take 1 tablet (500 mg total) by  mouth 2 (two) times daily with a meal. Patient not taking: Reported on 06/23/2018 12/26/17   Lavonia Drafts, MD  ondansetron (ZOFRAN ODT) 4 MG disintegrating tablet Take 1 tablet (4 mg total) by mouth every 8 (eight) hours as needed for nausea or vomiting. 06/28/18   Harvest Dark, MD  oxyCODONE-acetaminophen (PERCOCET) 5-325 MG tablet Take 1 tablet by mouth every 8 (eight) hours as needed. Patient not taking: Reported on 06/23/2018 05/18/18   Earleen Newport, MD  polyethylene glycol-electrolytes (NULYTELY/GOLYTELY) 420 g solution As directed Patient not taking: Reported on 05/28/2018 04/10/18   Virgel Manifold, MD  potassium chloride SA (K-DUR,KLOR-CON) 20 MEQ tablet Take 1 tablet (20 mEq total) by mouth daily for 5 days. Patient not taking: Reported on 05/28/2018 11/08/17 11/19/17  Elgie Collard, PA-C  simethicone (MYLICON) 80 MG chewable tablet Chew 1 tablet (80 mg total) by mouth 4 (four) times daily as needed for flatulence. Patient not taking: Reported on 04/10/2018 11/08/17   Elgie Collard, PA-C  traMADol (ULTRAM) 50 MG tablet Take 1 tablet (50  mg total) by mouth every 6 (six) hours as needed. Patient not taking: Reported on 05/28/2018 04/03/18 04/03/19  Merlyn Lot, MD  triamcinolone ointment (KENALOG) 0.5 % Apply 1 application topically 2 (two) times daily. Patient not taking: Reported on 05/28/2018 05/25/18   Rudene Re, MD    Allergies Bee venom; Codeine; and Sulfa antibiotics  Family History  Problem Relation Age of Onset  . Breast cancer Mother   . Huntington's disease Father   . Cancer Paternal Uncle        Colon  . Diabetes Son   . Cancer Maternal Grandfather   . Cancer Paternal Grandmother   . Heart disease Paternal Grandmother   . Huntington's disease Paternal Grandmother     Social History Social History   Tobacco Use  . Smoking status: Former Smoker    Packs/day: 0.50    Years: 20.00    Pack years: 10.00    Types: Cigarettes    Last attempt to  quit: 06/16/2017    Years since quitting: 1.0  . Smokeless tobacco: Never Used  Substance Use Topics  . Alcohol use: No    Alcohol/week: 0.0 standard drinks  . Drug use: No    Review of Systems Constitutional: No fever/chills Eyes: No visual changes. ENT: No sore throat. Cardiovascular: Denies chest pain. Respiratory: Denies shortness of breath. Gastrointestinal: Positive nausea vomiting no diarrhea.  No constipation. Genitourinary: Negative for dysuria. Musculoskeletal: Negative for neck pain.  Negative for back pain. Integumentary: Negative for rash. Neurological: Negative for headaches, focal weakness or numbness.  ____________________________________________   PHYSICAL EXAM:  VITAL SIGNS: ED Triage Vitals  Enc Vitals Group     BP 07/12/18 0142 133/87     Pulse Rate 07/12/18 0142 69     Resp 07/12/18 0142 16     Temp 07/12/18 0142 98.6 F (37 C)     Temp Source 07/12/18 0142 Oral     SpO2 07/12/18 0142 100 %     Weight 07/12/18 0145 58.6 kg (129 lb 3 oz)     Height 07/12/18 0145 1.778 m (5\' 10" )     Head Circumference --      Peak Flow --      Pain Score 07/12/18 0144 6     Pain Loc --      Pain Edu? --      Excl. in [place]? --     Constitutional: Alert and oriented. Well appearing and in no acute distress. Eyes: Conjunctivae are normal. Head: Atraumatic. Mouth/Throat: Mucous membranes are moist.  Oropharynx non-erythematous. Neck: No stridor. Cardiovascular: Normal rate, regular rhythm. Good peripheral circulation. Grossly normal heart sounds. Respiratory: Normal respiratory effort.  No retractions. Lungs CTAB. Gastrointestinal: Soft and nontender. No distention.  Musculoskeletal: No lower extremity tenderness nor edema. No gross deformities of extremities. Neurologic:  Normal speech and language. No gross focal neurologic deficits are appreciated.  Skin:  Skin is warm, dry and intact. No rash noted. Psychiatric: Mood and affect are normal. Speech and behavior  are normal.  ____________________________________________   LABS (all labs ordered are listed, but only abnormal results are displayed)  Labs Reviewed  COMPREHENSIVE METABOLIC PANEL - Abnormal; Notable for the following components:      Result Value   Sodium 132 (*)    AST 82 (*)    ALT 136 (*)    All other components within normal limits  CBC - Abnormal; Notable for the following components:   WBC 3.1 (*)    All other components  within normal limits  URINALYSIS, COMPLETE (UACMP) WITH MICROSCOPIC - Abnormal; Notable for the following components:   Color, Urine YELLOW (*)    APPearance CLEAR (*)    All other components within normal limits  LIPASE, BLOOD       Procedures   ____________________________________________   INITIAL IMPRESSION / ASSESSMENT AND PLAN / ED COURSE  As part of my medical decision making, I reviewed the following data within the electronic MEDICAL RECORD NUMBER   52 year old male presenting with above-stated history and physical exam of painless vomiting.  Reviewed the patient's chart from 11 visits in the last 6 months.  CT scan recently performed on 06/23/2018 was unremarkable for the same complaint.  Patient has an upcoming appointment with Dr. Bonna Gains gastroenterology.  Laboratory data today unremarkable.  Patient given Reglan in the emergency department no vomiting. ____________________________________________  FINAL CLINICAL IMPRESSION(S) / ED DIAGNOSES  Final diagnoses:  Non-intractable vomiting with nausea, unspecified vomiting type     MEDICATIONS GIVEN DURING THIS VISIT:  Medications  metoCLOPramide (REGLAN) injection 10 mg (10 mg Intravenous Given 07/12/18 0247)  sodium chloride 0.9 % bolus 1,000 mL (0 mLs Intravenous Stopped 07/12/18 0316)     ED Discharge Orders    None       Note:  This document was prepared using Dragon voice recognition software and may include unintentional dictation errors.    Gregor Hams,  MD 07/12/18 [phone]

## 2018-07-12 NOTE — ED Notes (Signed)
Patient to waiting room via wheelchair by EMS.  Per EMS patient with complaint of nausea.  VS:  HR - 70; BP - 144/98; cbg 137; EMS reported clear lung sounds.

## 2018-07-16 ENCOUNTER — Encounter: Payer: BLUE CROSS/BLUE SHIELD | Admitting: General Surgery

## 2018-07-16 ENCOUNTER — Encounter: Payer: Self-pay | Admitting: General Surgery

## 2018-07-16 NOTE — Progress Notes (Signed)
This encounter was created in error - please disregard.

## 2018-07-16 NOTE — Progress Notes (Deleted)
Patient ID: Jeffrey Prince, male   DOB: May 05, 1966, 52 y.o.   MRN: [id number]  Chief Complaint  Patient presents with  . Other    HPI Jeffrey Prince is a 52 y.o. male here today to discuss ct order by Dr. Bonna Gains. Patient has been seen in the ER for upper abdomen pain. He states the pain has been off and on for three months now. nausea and vomiting. Denise any fever or chills.  Uncle, Edd Arbour is present at visit.   HPI  Past Medical History:  Diagnosis Date  . Acne   . Anginal pain ([place])   . Blind left eye    Since birth  . Chorea   . Diverticulosis   . Dysrhythmia   . ED (erectile dysfunction)   . GERD (gastroesophageal reflux disease)   . Hemorrhoids   . Hypertension   . Weight loss    Abnormal    Past Surgical History:  Procedure Laterality Date  . BOTOX INJECTION N/A 10/09/2016   Procedure: BOTOX INJECTION;  Surgeon: Jules Husbands, MD;  Location: [facility];  Service: General;  Laterality: N/A;  . BOTOX INJECTION N/A 07/23/2017   Procedure: BOTOX INJECTION;  Surgeon: Jules Husbands, MD;  Location: [facility];  Service: General;  Laterality: N/A;  . CARDIAC SURGERY    . COLONOSCOPY     with polyp removal  . COLONOSCOPY WITH PROPOFOL N/A 04/23/2018   Procedure: COLONOSCOPY WITH PROPOFOL;  Surgeon: Virgel Manifold, MD;  Location: [facility];  Service: Endoscopy;  Laterality: N/A;  . CORONARY ARTERY BYPASS GRAFT N/A 11/03/2017   Procedure: CORONARY ARTERY BYPASS GRAFTING (CABG) x four, using left internal mammry artery and right leg greater saphenous vein harvested endoscopically (LIMA to LAD, SVG to OM1, SVG to RAMUS INTERMEDIATE, SVG to PDA)  ;  Surgeon: Grace Isaac, MD;  Location: [place];  Service: Open Heart Surgery;  Laterality: N/A;  . EVALUATION UNDER ANESTHESIA WITH ANAL FISSUROTOMY N/A 10/09/2016   Procedure: EXAM UNDER ANESTHESIA WITH ANAL FISSUROTOMY chemical sphincterotomy w BOtox.;  Surgeon: Jules Husbands, MD;  Location: [facility];  Service:  General;  Laterality: N/A;  Lithotomy, make sure botox is in the room  . EVALUATION UNDER ANESTHESIA WITH ANAL FISSUROTOMY N/A 07/23/2017   Procedure: EXAM UNDER ANESTHESIA WITH ANAL FISSUROTOMY, botox injection;  Surgeon: Jules Husbands, MD;  Location: [facility];  Service: General;  Laterality: N/A;  . HEMORRHOID SURGERY    . LEFT HEART CATH AND CORONARY ANGIOGRAPHY Right 10/30/2017   Procedure: LEFT HEART CATH AND CORONARY ANGIOGRAPHY;  Surgeon: Dionisio David, MD;  Location: [place] [facility];  Service: Cardiovascular;  Laterality: Right;  . LEFT HEART CATH AND CORONARY ANGIOGRAPHY Right 11/20/2017   Procedure: LEFT HEART CATH AND CORONARY ANGIOGRAPHY;  Surgeon: Dionisio David, MD;  Location: [place] [facility];  Service: Cardiovascular;  Laterality: Right;  . ROTATOR CUFF REPAIR  1991  . TEE WITHOUT CARDIOVERSION N/A 11/03/2017   Procedure: TRANSESOPHAGEAL ECHOCARDIOGRAM (TEE);  Surgeon: Grace Isaac, MD;  Location: [place];  Service: Open Heart Surgery;  Laterality: N/A;    Family History  Problem Relation Age of Onset  . Breast cancer Mother   . Huntington's disease Father   . Cancer Paternal Uncle        Colon  . Diabetes Son   . Cancer Maternal Grandfather   . Cancer Paternal Grandmother   . Heart disease Paternal Grandmother   . Huntington's disease Paternal Grandmother  Social History Social History   Tobacco Use  . Smoking status: Former Smoker    Packs/day: 0.50    Years: 20.00    Pack years: 10.00    Types: Cigarettes    Last attempt to quit: 06/16/2017    Years since quitting: 1.0  . Smokeless tobacco: Never Used  Substance Use Topics  . Alcohol use: No    Alcohol/week: 0.0 standard drinks  . Drug use: No    Allergies  Allergen Reactions  . Bee Venom Swelling    SWELLING REACTION UNSPECIFIED  SEVERITY RATED HIGH FROM PMH  . Codeine Diarrhea, Nausea Only and Rash  . Sulfa Antibiotics Nausea And Vomiting    INCLUDES SPECIFICALLY SULFASALAZINE     Current Outpatient Medications  Medication Sig Dispense Refill  . aspirin EC 325 MG EC tablet Take 1 tablet (325 mg total) by mouth daily. 30 tablet 0  . atorvastatin (LIPITOR) 40 MG tablet Take 1 tablet (40 mg total) by mouth daily. 30 tablet 0  . dicyclomine (BENTYL) 20 MG tablet Take 1 tablet (20 mg total) by mouth 2 (two) times daily. 20 tablet 0  . diphenoxylate-atropine (LOMOTIL) 2.5-0.025 MG tablet Take 1 tablet by mouth 4 (four) times daily as needed for diarrhea or loose stools. 15 tablet 0  . isosorbide mononitrate (IMDUR) 30 MG 24 hr tablet Take 1 tablet (30 mg total) by mouth daily. 30 tablet 1  . lidocaine (LIDODERM) 5 % Place 1 patch onto the skin every 12 (twelve) hours. Remove & Discard patch within 12 hours or as directed by MD 10 patch 0  . metoCLOPramide (REGLAN) 10 MG tablet Take 1 tablet (10 mg total) by mouth every 6 (six) hours as needed for nausea. 20 tablet 0  . metoprolol tartrate (LOPRESSOR) 25 MG tablet Take 0.5 tablets (12.5 mg total) by mouth 2 (two) times daily. 30 tablet 1  . naproxen (NAPROSYN) 500 MG tablet Take 1 tablet (500 mg total) by mouth 2 (two) times daily with a meal. 20 tablet 2  . ondansetron (ZOFRAN ODT) 4 MG disintegrating tablet Take 1 tablet (4 mg total) by mouth every 8 (eight) hours as needed for nausea or vomiting. 20 tablet 0  . oxyCODONE-acetaminophen (PERCOCET) 5-325 MG tablet Take 1 tablet by mouth every 8 (eight) hours as needed. 20 tablet 0  . polyethylene glycol-electrolytes (NULYTELY/GOLYTELY) 420 g solution As directed 4000 mL 0  . promethazine (PHENERGAN) 12.5 MG tablet Take 1 tablet (12.5 mg total) by mouth every 6 (six) hours as needed for nausea or vomiting. 30 tablet 0  . simethicone (MYLICON) 80 MG chewable tablet Chew 1 tablet (80 mg total) by mouth 4 (four) times daily as needed for flatulence. 30 tablet 0  . traMADol (ULTRAM) 50 MG tablet Take 1 tablet (50 mg total) by mouth every 6 (six) hours as needed. 10 tablet 0  .  triamcinolone ointment (KENALOG) 0.5 % Apply 1 application topically 2 (two) times daily. 30 g 0  . furosemide (LASIX) 40 MG tablet Take 1 tablet (40 mg total) by mouth daily for 5 days. (Patient not taking: Reported on 05/28/2018) 5 tablet 0  . potassium chloride SA (K-DUR,KLOR-CON) 20 MEQ tablet Take 1 tablet (20 mEq total) by mouth daily for 5 days. (Patient not taking: Reported on 05/28/2018) 5 tablet 0   No current facility-administered medications for this visit.     Review of Systems Review of Systems  Constitutional: Negative.   Respiratory: Negative.   Cardiovascular: Negative.  Gastrointestinal: Positive for abdominal pain, nausea and vomiting. Negative for diarrhea.    Blood pressure 116/89, pulse 68, temperature 97.7 F (36.5 C), temperature source Skin, resp. rate 12, height 5\' 10"  (1.778 m), weight 129 lb (58.5 kg).  Physical Exam Physical Exam  Constitutional: He is oriented to person, place, and time. He appears well-developed and well-nourished.  Eyes: Conjunctivae are normal. No scleral icterus.  Neck: Normal range of motion.  Cardiovascular: Normal rate, regular rhythm and normal heart sounds.  Pulmonary/Chest: Effort normal and breath sounds normal.  Lymphadenopathy:    He has no cervical adenopathy.  Neurological: He is alert and oriented to person, place, and time.  Skin: Skin is warm and dry.    Data Reviewed ***  Assessment    ***    Plan    ***       Jeffrey Prince 07/16/2018, 2:36 PM

## 2018-07-20 ENCOUNTER — Ambulatory Visit (INDEPENDENT_AMBULATORY_CARE_PROVIDER_SITE_OTHER): Payer: Self-pay | Admitting: Surgery

## 2018-07-20 ENCOUNTER — Telehealth: Payer: Self-pay | Admitting: Surgery

## 2018-07-20 ENCOUNTER — Other Ambulatory Visit
Admission: RE | Admit: 2018-07-20 | Discharge: 2018-07-20 | Disposition: A | Discharge status: Home / Self Care | Payer: Medicaid Other | Source: Ambulatory Visit | Attending: Surgery | Admitting: Surgery

## 2018-07-20 ENCOUNTER — Encounter: Payer: Self-pay | Admitting: Surgery

## 2018-07-20 VITALS — BP 98/74 | HR 72 | Temp 97.3°F | Resp 18 | Ht 70.0 in | Wt 101.8 lb

## 2018-07-20 DIAGNOSIS — D708 Other neutropenia: Secondary | ICD-10-CM

## 2018-07-20 DIAGNOSIS — R590 Localized enlarged lymph nodes: Secondary | ICD-10-CM

## 2018-07-20 DIAGNOSIS — R634 Abnormal weight loss: Secondary | ICD-10-CM

## 2018-07-20 LAB — CBC WITH DIFFERENTIAL/PLATELET
Basophils Absolute: 0 10*3/uL (ref 0–0.1)
Basophils Relative: 1 %
Eosinophils Absolute: 0 10*3/uL (ref 0–0.7)
Eosinophils Relative: 1 %
HCT: 50 % (ref 40.0–52.0)
Hemoglobin: 17.2 g/dL (ref 13.0–18.0)
LYMPHS ABS: 1 10*3/uL (ref 1.0–3.6)
Lymphocytes Relative: 38 %
MCH: 29.8 pg (ref 26.0–34.0)
MCHC: 34.4 g/dL (ref 32.0–36.0)
MCV: 86.7 fL (ref 80.0–100.0)
Monocytes Absolute: 0.2 10*3/uL (ref 0.2–1.0)
Monocytes Relative: 9 %
Neutro Abs: 1.4 10*3/uL (ref 1.4–6.5)
Neutrophils Relative %: 51 %
Platelets: 285 10*3/uL (ref 150–440)
RBC: 5.77 MIL/uL (ref 4.40–5.90)
RDW: 14.8 % — ABNORMAL HIGH (ref 11.5–14.5)
WBC: 2.8 10*3/uL — AB (ref 3.8–10.6)

## 2018-07-20 LAB — COMPREHENSIVE METABOLIC PANEL
ALT: 70 U/L — ABNORMAL HIGH (ref 0–44)
AST: 50 U/L — AB (ref 15–41)
Albumin: 4.1 g/dL (ref 3.5–5.0)
Alkaline Phosphatase: 56 U/L (ref 38–126)
Anion gap: 9 (ref 5–15)
BUN: 20 mg/dL (ref 6–20)
CHLORIDE: 103 mmol/L (ref 98–111)
CO2: 27 mmol/L (ref 22–32)
Calcium: 9.8 mg/dL (ref 8.9–10.3)
Creatinine, Ser: 0.86 mg/dL (ref 0.61–1.24)
GFR calc Af Amer: >60 mL/min (ref >60)
Glucose, Bld: 107 mg/dL — ABNORMAL HIGH (ref 70–99)
Potassium: 4.5 mmol/L (ref 3.5–5.1)
Sodium: 139 mmol/L (ref 135–145)
Total Bilirubin: 1 mg/dL (ref 0.3–1.2)
Total Protein: 7.9 g/dL (ref 6.5–8.1)

## 2018-07-20 LAB — PROTIME-INR
INR: 1.06
PROTHROMBIN TIME: 13.7 s (ref 11.4–15.2)

## 2018-07-20 LAB — APTT: aPTT: 32 seconds (ref 24–36)

## 2018-07-20 NOTE — Progress Notes (Signed)
Outpatient Surgical Follow Up  07/20/2018  Jeffrey Prince is an 52 y.o. male.   Chief Complaint  Patient presents with  . Follow-up    abdominal pain    HPI:  Jeffrey Prince is a 52 year old male known to me with a history of anal fissure.  Over the last year he is health has declined significantly.  He did have a CABG performed December 2018 and has had significant weight loss over the last several months.  Now he comes in with inguinal lymphadenopathy and he is very frustrated. He has some difficulty finding words and elaborating story.  He is accompanied by his uncle.  He has had a colonoscopy that I have personally reviewed without any major issues.  He continues to have some abdominal pain and nausea.  A CT scan performed last month that I have personally reviewed showed no evidence of any acute disease.  He did reports some night sweats, chills. Does have lymphadenopathy in both inguinal areas.   Past Medical History:  Diagnosis Date  . Acne   . Anginal pain ([place])   . Blind left eye    Since birth  . Chorea   . Diverticulosis   . Dysrhythmia   . ED (erectile dysfunction)   . GERD (gastroesophageal reflux disease)   . Hemorrhoids   . Hypertension   . Weight loss    Abnormal    Past Surgical History:  Procedure Laterality Date  . BOTOX INJECTION N/A 10/09/2016   Procedure: BOTOX INJECTION;  Surgeon: Jules Husbands, MD;  Location: [facility];  Service: General;  Laterality: N/A;  . BOTOX INJECTION N/A 07/23/2017   Procedure: BOTOX INJECTION;  Surgeon: Jules Husbands, MD;  Location: [facility];  Service: General;  Laterality: N/A;  . CARDIAC SURGERY    . COLONOSCOPY     with polyp removal  . COLONOSCOPY WITH PROPOFOL N/A 04/23/2018   Procedure: COLONOSCOPY WITH PROPOFOL;  Surgeon: Virgel Manifold, MD;  Location: [facility];  Service: Endoscopy;  Laterality: N/A;  . CORONARY ARTERY BYPASS GRAFT N/A 11/03/2017   Procedure: CORONARY ARTERY BYPASS GRAFTING (CABG) x four,  using left internal mammry artery and right leg greater saphenous vein harvested endoscopically (LIMA to LAD, SVG to OM1, SVG to RAMUS INTERMEDIATE, SVG to PDA)  ;  Surgeon: Grace Isaac, MD;  Location: [facility];  Service: Open Heart Surgery;  Laterality: N/A;  . EVALUATION UNDER ANESTHESIA WITH ANAL FISSUROTOMY N/A 10/09/2016   Procedure: EXAM UNDER ANESTHESIA WITH ANAL FISSUROTOMY chemical sphincterotomy w BOtox.;  Surgeon: Jules Husbands, MD;  Location: [facility];  Service: General;  Laterality: N/A;  Lithotomy, make sure botox is in the room  . EVALUATION UNDER ANESTHESIA WITH ANAL FISSUROTOMY N/A 07/23/2017   Procedure: EXAM UNDER ANESTHESIA WITH ANAL FISSUROTOMY, botox injection;  Surgeon: Jules Husbands, MD;  Location: [facility];  Service: General;  Laterality: N/A;  . HEMORRHOID SURGERY    . LEFT HEART CATH AND CORONARY ANGIOGRAPHY Right 10/30/2017   Procedure: LEFT HEART CATH AND CORONARY ANGIOGRAPHY;  Surgeon: Dionisio David, MD;  Location: [facility];  Service: Cardiovascular;  Laterality: Right;  . LEFT HEART CATH AND CORONARY ANGIOGRAPHY Right 11/20/2017   Procedure: LEFT HEART CATH AND CORONARY ANGIOGRAPHY;  Surgeon: Dionisio David, MD;  Location: [facility];  Service: Cardiovascular;  Laterality: Right;  . ROTATOR CUFF REPAIR  1991  . TEE WITHOUT CARDIOVERSION N/A 11/03/2017   Procedure: TRANSESOPHAGEAL ECHOCARDIOGRAM (TEE);  Surgeon: Servando Snare,  Lilia Argue, MD;  Location: [facility];  Service: Open Heart Surgery;  Laterality: N/A;    Family History  Problem Relation Age of Onset  . Breast cancer Mother   . Huntington's disease Father   . Cancer Paternal Uncle        Colon  . Diabetes Son   . Cancer Maternal Grandfather   . Cancer Paternal Grandmother   . Heart disease Paternal Grandmother   . Huntington's disease Paternal Grandmother     Social History:  reports that he quit smoking about 13 months ago. His smoking use included cigarettes. He has a 10.00  pack-year smoking history. He has never used smokeless tobacco. He reports that he does not drink alcohol or use drugs.  Allergies:  Allergies  Allergen Reactions  . Bee Venom Swelling    SWELLING REACTION UNSPECIFIED  SEVERITY RATED HIGH FROM PMH  . Codeine Diarrhea, Nausea Only and Rash  . Sulfa Antibiotics Nausea And Vomiting    INCLUDES SPECIFICALLY SULFASALAZINE    Medications reviewed.    ROS Full ROS performed and is otherwise negative other than what is stated in HPI   BP 98/74   Pulse 72   Temp (!) 97.3 F (36.3 C) (Temporal)   Resp 18   Ht 5\' 10"  (1.778 m)   Wt 46.2 kg   BMI 14.61 kg/m   Physical Exam  Constitutional: He is oriented to person, place, and time. No distress.  He is severely malnourished  Neck: Normal range of motion. No tracheal deviation present. No thyromegaly present.  Cardiovascular: Normal rate, regular rhythm, normal heart sounds and intact distal pulses.  Sternal wires can be felt through a very thin layer of skin.  The sternotomy incision is healed well  Pulmonary/Chest: Effort normal and breath sounds normal.  Abdominal: Soft. He exhibits no distension. There is no tenderness. There is no rebound and no guarding. No hernia.  Small tiny subcutaneous lipoma to the left and superior to the [place].  No peritonitis no masses.  Musculoskeletal: Normal range of motion. He exhibits no edema or deformity.  There is bilateral lymphadenopathy on inguinal regions.  Right seems to be more pronounced.  These are nontender.  Latest size 2 cm.  Lymphadenopathy:    He has no cervical adenopathy.  Neurological: He is alert and oriented to person, place, and time. No cranial nerve deficit.  Skin: Skin is warm and dry. Capillary refill takes less than 2 seconds. He is not diaphoretic.  Psychiatric: He has a normal mood and affect. His behavior is normal. Judgment and thought content normal.  Nursing note and vitals reviewed.    Assessment/Plan: We will  male with failure to thrive and severe malnutrition with some B type symptoms and lymphadenopathy concerning for lymphoma or any other blood dyscrasias.  He does have evidence of leukopenia Gust with the patient in detail about my concern about either an occult malignancy or metastatic disease or lymphoma.  I do think the next step will be to perform a biopsy of the right inguinal region to elucidate the potential for lymphoma.  I will also request a consultation with 1 of our hematoma colleges so he can guide his work-up and therapy. We will schedule him for a lymph node biopsy in the OR.  The right side seems to have more prominence.  Discussed with the patient detail about the procedure , benefit and possible complications including but not limited to: Bleeding, infection, seroma.  He understands and wishes to  proceed  1. Other neutropenia ([place])  - Ambulatory referral to Hematology / Oncology - CBC w/Diff/Platelet - Comprehensive Metabolic Panel (CMET) - PTT - INR/PT    Greater than 50% of the 30 minutes  visit was spent in counseling/coordination of care   Caroleen Hamman, MD [facility] Surgeon

## 2018-07-20 NOTE — Telephone Encounter (Signed)
I have called patient to go over surgery information. No answer. I have left a message on patient's voicemail.  sx date Sx: 07/22/18 with Dr Othelia Pulling inguinal lymph node excision.  Pre op: arrive at 9:45am the day of surgery-[facility]-registration desk.  Advise patient-NPO after midnight-hold all medications-clear fluids up until 2 hours prior to arrival time.

## 2018-07-20 NOTE — Patient Instructions (Addendum)
We have sent the referral to Medical Oncology. Someone will call you within the next 3 days to schedule an appointment.   Please stop by the [facility] lab to have blood drawn.   We will call you with your surgery date later today.   Please call if you have questions or concerns.

## 2018-07-21 ENCOUNTER — Encounter: Payer: Self-pay | Admitting: Gastroenterology

## 2018-07-21 ENCOUNTER — Ambulatory Visit: Payer: BLUE CROSS/BLUE SHIELD | Admitting: Gastroenterology

## 2018-07-21 DIAGNOSIS — D120 Benign neoplasm of cecum: Secondary | ICD-10-CM

## 2018-07-22 ENCOUNTER — Ambulatory Visit: Admission: RE | Admit: 2018-07-22 | Payer: BLUE CROSS/BLUE SHIELD | Source: Ambulatory Visit | Admitting: Surgery

## 2018-07-22 ENCOUNTER — Encounter: Admission: RE | Payer: Self-pay | Source: Ambulatory Visit

## 2018-07-22 SURGERY — BIOPSY, LYMPH NODE, INGUINAL, OPEN
Anesthesia: Choice | Laterality: Right

## 2018-07-22 MED ORDER — CEFAZOLIN SODIUM-DEXTROSE 2-4 GM/100ML-% IV SOLN: 2.0000 g | INTRAVENOUS | Status: DC

## 2018-07-22 NOTE — Telephone Encounter (Signed)
I spoke with Jeffrey Prince in the OR to advise her that I have tried multiple times to contact this patient regarding his surgery information. No success. She stated that SDS must have talked with the patient when confirming the time of arrival.

## 2018-07-24 ENCOUNTER — Inpatient Hospital Stay: Payer: Medicaid Other

## 2018-07-24 ENCOUNTER — Inpatient Hospital Stay: Payer: Medicaid Other | Attending: Oncology | Admitting: Oncology

## 2018-07-24 ENCOUNTER — Other Ambulatory Visit: Payer: Self-pay

## 2018-07-24 VITALS — BP 134/88 | HR 67 | Temp 96.0°F | Resp 18 | Wt 100.0 lb

## 2018-07-24 DIAGNOSIS — Z800 Family history of malignant neoplasm of digestive organs: Secondary | ICD-10-CM | POA: Diagnosis not present

## 2018-07-24 DIAGNOSIS — Z803 Family history of malignant neoplasm of breast: Secondary | ICD-10-CM | POA: Insufficient documentation

## 2018-07-24 DIAGNOSIS — Z809 Family history of malignant neoplasm, unspecified: Secondary | ICD-10-CM | POA: Diagnosis not present

## 2018-07-24 DIAGNOSIS — D709 Neutropenia, unspecified: Secondary | ICD-10-CM | POA: Diagnosis not present

## 2018-07-24 DIAGNOSIS — R591 Generalized enlarged lymph nodes: Secondary | ICD-10-CM

## 2018-07-24 DIAGNOSIS — R634 Abnormal weight loss: Secondary | ICD-10-CM

## 2018-07-24 DIAGNOSIS — Z87891 Personal history of nicotine dependence: Secondary | ICD-10-CM | POA: Diagnosis not present

## 2018-07-24 NOTE — Progress Notes (Signed)
Patient here today for new evaluation regarding neutropenia.  

## 2018-07-24 NOTE — Progress Notes (Signed)
[facility]  [phone] [phone]  ID: Jeffrey Prince OB: July 08, 1966  MR#: [id number]  KPQ#:[id number]  Patient Care Team: Perrin Maltese, MD as PCP - General (Internal Medicine) Dionisio David, MD as PCP - Cardiology (Cardiology)  CHIEF COMPLAINT: Neutropenia, unintentional weight loss, lymphadenopathy.  INTERVAL HISTORY: Patient is a 52 year old male who is had a significant amount of unintentional weight loss as well as diffuse abdominal pain over the past 3 months.  He also was noted to have worsening neutropenia over the past several weeks.  Patient reports he was in his usual state of health 4 months ago.  He has no neurologic complaints.  He admits to low-grade fevers, but denies any specific illnesses.  He has no chest pain, shortness of breath, or cough.  He denies any nausea, vomiting, constipation, or diarrhea.  He has no urinary complaints.  Patient offers no further specific complaints.  REVIEW OF SYSTEMS:   Review of Systems  Constitutional: Positive for fever, malaise/fatigue and weight loss.  Respiratory: Negative.  Negative for cough and shortness of breath.   Cardiovascular: Negative.  Negative for chest pain and leg swelling.  Gastrointestinal: Positive for abdominal pain. Negative for blood in stool, constipation, diarrhea, melena, nausea and vomiting.  Genitourinary: Negative.  Negative for dysuria.  Musculoskeletal: Negative.  Negative for back pain.  Skin: Negative.  Negative for rash.  Neurological: Negative.  Negative for dizziness, focal weakness, weakness and headaches.  Psychiatric/Behavioral: Negative.  The patient is not nervous/anxious.     As per HPI. Otherwise, a complete review of systems is negative.  PAST MEDICAL HISTORY: Past Medical History:  Diagnosis Date  . Acne   . Anginal pain ([place])   . Blind left eye    Since birth  . Chorea   . Diverticulosis   . Dysrhythmia   . ED (erectile dysfunction)    . GERD (gastroesophageal reflux disease)   . Hemorrhoids   . Hypertension   . Weight loss    Abnormal    PAST SURGICAL HISTORY: Past Surgical History:  Procedure Laterality Date  . BOTOX INJECTION N/A 10/09/2016   Procedure: BOTOX INJECTION;  Surgeon: Jules Husbands, MD;  Location: [facility];  Service: General;  Laterality: N/A;  . BOTOX INJECTION N/A 07/23/2017   Procedure: BOTOX INJECTION;  Surgeon: Jules Husbands, MD;  Location: [facility];  Service: General;  Laterality: N/A;  . CARDIAC SURGERY    . COLONOSCOPY     with polyp removal  . COLONOSCOPY WITH PROPOFOL N/A 04/23/2018   Procedure: COLONOSCOPY WITH PROPOFOL;  Surgeon: Virgel Manifold, MD;  Location: [facility];  Service: Endoscopy;  Laterality: N/A;  . CORONARY ARTERY BYPASS GRAFT N/A 11/03/2017   Procedure: CORONARY ARTERY BYPASS GRAFTING (CABG) x four, using left internal mammry artery and right leg greater saphenous vein harvested endoscopically (LIMA to LAD, SVG to OM1, SVG to RAMUS INTERMEDIATE, SVG to PDA)  ;  Surgeon: Grace Isaac, MD;  Location: [facility];  Service: Open Heart Surgery;  Laterality: N/A;  . EVALUATION UNDER ANESTHESIA WITH ANAL FISSUROTOMY N/A 10/09/2016   Procedure: EXAM UNDER ANESTHESIA WITH ANAL FISSUROTOMY chemical sphincterotomy w BOtox.;  Surgeon: Jules Husbands, MD;  Location: [facility];  Service: General;  Laterality: N/A;  Lithotomy, make sure botox is in the room  . EVALUATION UNDER ANESTHESIA WITH ANAL FISSUROTOMY N/A 07/23/2017   Procedure: EXAM UNDER ANESTHESIA WITH ANAL FISSUROTOMY, botox injection;  Surgeon: Jules Husbands, MD;  Location:  [facility];  Service: General;  Laterality: N/A;  . HEMORRHOID SURGERY    . LEFT HEART CATH AND CORONARY ANGIOGRAPHY Right 10/30/2017   Procedure: LEFT HEART CATH AND CORONARY ANGIOGRAPHY;  Surgeon: Dionisio David, MD;  Location: [facility];  Service: Cardiovascular;  Laterality: Right;  . LEFT HEART CATH AND CORONARY ANGIOGRAPHY Right  11/20/2017   Procedure: LEFT HEART CATH AND CORONARY ANGIOGRAPHY;  Surgeon: Dionisio David, MD;  Location: [facility];  Service: Cardiovascular;  Laterality: Right;  . ROTATOR CUFF REPAIR  1991  . TEE WITHOUT CARDIOVERSION N/A 11/03/2017   Procedure: TRANSESOPHAGEAL ECHOCARDIOGRAM (TEE);  Surgeon: Grace Isaac, MD;  Location: [facility];  Service: Open Heart Surgery;  Laterality: N/A;    FAMILY HISTORY: Family History  Problem Relation Age of Onset  . Breast cancer Mother   . Huntington's disease Father   . Cancer Paternal Uncle        Colon  . Diabetes Son   . Cancer Maternal Grandfather   . Cancer Paternal Grandmother   . Heart disease Paternal Grandmother   . Huntington's disease Paternal Grandmother     ADVANCED DIRECTIVES (Y/N):  N  HEALTH MAINTENANCE: Social History   Tobacco Use  . Smoking status: Former Smoker    Packs/day: 0.50    Years: 20.00    Pack years: 10.00    Types: Cigarettes    Last attempt to quit: 06/16/2017    Years since quitting: 1.1  . Smokeless tobacco: Never Used  Substance Use Topics  . Alcohol use: No    Alcohol/week: 0.0 standard drinks  . Drug use: No     Colonoscopy:  PAP:  Bone density:  Lipid panel:  Allergies  Allergen Reactions  . Bee Venom Swelling    SWELLING REACTION UNSPECIFIED  SEVERITY RATED HIGH FROM PMH  . Codeine Diarrhea, Nausea Only and Rash  . Sulfa Antibiotics Nausea And Vomiting    INCLUDES SPECIFICALLY SULFASALAZINE    Current Outpatient Medications  Medication Sig Dispense Refill  . aspirin EC 325 MG EC tablet Take 1 tablet (325 mg total) by mouth daily. 30 tablet 0  . atorvastatin (LIPITOR) 40 MG tablet Take 1 tablet (40 mg total) by mouth daily. 30 tablet 0  . isosorbide mononitrate (IMDUR) 30 MG 24 hr tablet Take 1 tablet (30 mg total) by mouth daily. 30 tablet 1  . furosemide (LASIX) 40 MG tablet Take 1 tablet (40 mg total) by mouth daily for 5 days. (Patient not taking: Reported on  05/28/2018) 5 tablet 0  . potassium chloride SA (K-DUR,KLOR-CON) 20 MEQ tablet Take 1 tablet (20 mEq total) by mouth daily for 5 days. (Patient not taking: Reported on 05/28/2018) 5 tablet 0  . promethazine (PHENERGAN) 12.5 MG tablet Take 1 tablet (12.5 mg total) by mouth every 6 (six) hours as needed for nausea or vomiting. 30 tablet 0  . simethicone (MYLICON) 80 MG chewable tablet Chew 1 tablet (80 mg total) by mouth 4 (four) times daily as needed for flatulence. 30 tablet 0  . traMADol (ULTRAM) 50 MG tablet Take 1 tablet (50 mg total) by mouth every 6 (six) hours as needed. 10 tablet 0  . triamcinolone ointment (KENALOG) 0.5 % Apply 1 application topically 2 (two) times daily. 30 g 0   No current facility-administered medications for this visit.     OBJECTIVE: Vitals:   07/24/18 1320  BP: 134/88  Pulse: 67  Resp: 18  Temp: (!) 96 F (35.6  C)     Body mass index is 14.35 kg/m.    ECOG FS:1 - Symptomatic but completely ambulatory  General: Cachectic, no acute distress. Eyes: Pink conjunctiva, anicteric sclera. HEENT: Normocephalic, moist mucous membranes, clear oropharnyx. Lungs: Clear to auscultation bilaterally. Heart: Regular rate and rhythm. No rubs, murmurs, or gallops. Abdomen: Soft, nontender, nondistended. No organomegaly noted, normoactive bowel sounds. Musculoskeletal: No edema, cyanosis, or clubbing. Neuro: Alert, answering all questions appropriately. Cranial nerves grossly intact. Skin: No rashes or petechiae noted. Psych: Flat affect. Lymphatics: Minimally palpable inguinal lymph nodes.   LAB RESULTS:  Lab Results  Component Value Date   NA 139 07/20/2018   K 4.5 07/20/2018   CL 103 07/20/2018   CO2 27 07/20/2018   GLUCOSE 107 (H) 07/20/2018   BUN 20 07/20/2018   CREATININE 0.86 07/20/2018   CALCIUM 9.8 07/20/2018   PROT 7.9 07/20/2018   ALBUMIN 4.1 07/20/2018   AST 50 (H) 07/20/2018   ALT 70 (H) 07/20/2018   ALKPHOS 56 07/20/2018   BILITOT 1.0  07/20/2018   GFRNONAA >60 07/20/2018   GFRAA >60 07/20/2018    Lab Results  Component Value Date   WBC 2.8 (L) 07/20/2018   NEUTROABS 1.4 07/20/2018   HGB 17.2 07/20/2018   HCT 50.0 07/20/2018   MCV 86.7 07/20/2018   PLT 285 07/20/2018     STUDIES: No results found.  ASSESSMENT: Neutropenia, unintentional weight loss, lymphadenopathy.  PLAN:   1.  Neutropenia: Unclear etiology, possibly could be nutritional.  Lab work was ordered today, but patient ultimately refused blood draw.  Will reschedule for next week.  He does not require bone marrow biopsy at this time, but will consider one in the near future if no other etiology is determined.  Return to clinic in 1 week for further evaluation. 2.  Unintentional weight loss: Recent CT scans did not reveal a distinct etiology.  Will repeat CT scan of the chest, abdomen, and pelvis to assess for any interval change and possible etiology.  Have also ordered HIV screening for completeness.  Return to clinic in 1 week as above. 3.  Lymphadenopathy: Patient is scheduled to have lymph node biopsy with surgery several days ago.  We will further discuss with surgery to reschedule this appointment.  I spent a total of 60 minutes face-to-face with the patient of which greater than 50% of the visit was spent in counseling and coordination of care as detailed above.   Patient expressed understanding and was in agreement with this plan. He also understands that He can call clinic at any time with any questions, concerns, or complaints.   Cancer Staging No matching staging information was found for the patient.  Lloyd Huger, MD   07/24/2018 4:29 PM

## 2018-07-26 DIAGNOSIS — D709 Neutropenia, unspecified: Secondary | ICD-10-CM | POA: Insufficient documentation

## 2018-07-26 NOTE — Progress Notes (Signed)
[place]  [phone] [phone]) [phone]  ID: Jeffrey Prince OB: January 05, 1966  MR#: [id number]  FRT#:[id number]  Patient Care Team: Perrin Maltese, MD as PCP - General (Internal Medicine) Dionisio David, MD as PCP - Cardiology (Cardiology)  CHIEF COMPLAINT: Neutropenia, unintentional weight loss, lymphadenopathy.  INTERVAL HISTORY: Patient returns to clinic today for further evaluation and discussion of his imaging and lab results.  He was evaluated in the ER earlier this week for altered mental status, but is now back to his baseline.  He continues to have a poor appetite and weight loss. He has no neurologic complaints.  He does not complain of fevers today.  He has no chest pain, shortness of breath, or cough.  He denies any nausea, vomiting, constipation, or diarrhea.  He has no urinary complaints.  Patient offers no further specific complaints today.  REVIEW OF SYSTEMS:   Review of Systems  Constitutional: Positive for malaise/fatigue and weight loss. Negative for fever.  Respiratory: Negative.  Negative for cough and shortness of breath.   Cardiovascular: Negative.  Negative for chest pain and leg swelling.  Gastrointestinal: Positive for abdominal pain. Negative for blood in stool, constipation, diarrhea, melena, nausea and vomiting.  Genitourinary: Negative.  Negative for dysuria.  Musculoskeletal: Negative.  Negative for back pain.  Skin: Negative.  Negative for rash.  Neurological: Positive for weakness. Negative for dizziness, focal weakness and headaches.  Psychiatric/Behavioral: Negative.  The patient is not nervous/anxious.     As per HPI. Otherwise, a complete review of systems is negative.  PAST MEDICAL HISTORY: Past Medical History:  Diagnosis Date  . Acne   . Anginal pain ([place])   . Blind left eye    Since birth  . Chorea   . Diverticulosis   . Dysrhythmia   . ED (erectile dysfunction)   . GERD (gastroesophageal reflux  disease)   . Hemorrhoids   . Hypertension   . Weight loss    Abnormal    PAST SURGICAL HISTORY: Past Surgical History:  Procedure Laterality Date  . BOTOX INJECTION N/A 10/09/2016   Procedure: BOTOX INJECTION;  Surgeon: Jules Husbands, MD;  Location: [facility];  Service: General;  Laterality: N/A;  . BOTOX INJECTION N/A 07/23/2017   Procedure: BOTOX INJECTION;  Surgeon: Jules Husbands, MD;  Location: [facility];  Service: General;  Laterality: N/A;  . CARDIAC SURGERY    . COLONOSCOPY     with polyp removal  . COLONOSCOPY WITH PROPOFOL N/A 04/23/2018   Procedure: COLONOSCOPY WITH PROPOFOL;  Surgeon: Virgel Manifold, MD;  Location: [facility];  Service: Endoscopy;  Laterality: N/A;  . CORONARY ARTERY BYPASS GRAFT N/A 11/03/2017   Procedure: CORONARY ARTERY BYPASS GRAFTING (CABG) x four, using left internal mammry artery and right leg greater saphenous vein harvested endoscopically (LIMA to LAD, SVG to OM1, SVG to RAMUS INTERMEDIATE, SVG to PDA)  ;  Surgeon: Grace Isaac, MD;  Location: [place];  Service: Open Heart Surgery;  Laterality: N/A;  . EVALUATION UNDER ANESTHESIA WITH ANAL FISSUROTOMY N/A 10/09/2016   Procedure: EXAM UNDER ANESTHESIA WITH ANAL FISSUROTOMY chemical sphincterotomy w BOtox.;  Surgeon: Jules Husbands, MD;  Location: [facility];  Service: General;  Laterality: N/A;  Lithotomy, make sure botox is in the room  . EVALUATION UNDER ANESTHESIA WITH ANAL FISSUROTOMY N/A 07/23/2017   Procedure: EXAM UNDER ANESTHESIA WITH ANAL FISSUROTOMY, botox injection;  Surgeon: Jules Husbands, MD;  Location: [facility];  Service: General;  Laterality:  N/A;  . HEMORRHOID SURGERY    . LEFT HEART CATH AND CORONARY ANGIOGRAPHY Right 10/30/2017   Procedure: LEFT HEART CATH AND CORONARY ANGIOGRAPHY;  Surgeon: Dionisio David, MD;  Location: [place] [facility];  Service: Cardiovascular;  Laterality: Right;  . LEFT HEART CATH AND CORONARY ANGIOGRAPHY Right 11/20/2017   Procedure: LEFT HEART CATH  AND CORONARY ANGIOGRAPHY;  Surgeon: Dionisio David, MD;  Location: [place] [facility];  Service: Cardiovascular;  Laterality: Right;  . ROTATOR CUFF REPAIR  1991  . TEE WITHOUT CARDIOVERSION N/A 11/03/2017   Procedure: TRANSESOPHAGEAL ECHOCARDIOGRAM (TEE);  Surgeon: Grace Isaac, MD;  Location: [place];  Service: Open Heart Surgery;  Laterality: N/A;    FAMILY HISTORY: Family History  Problem Relation Age of Onset  . Breast cancer Mother   . Huntington's disease Father   . Cancer Paternal Uncle        Colon  . Diabetes Son   . Cancer Maternal Grandfather   . Cancer Paternal Grandmother   . Heart disease Paternal Grandmother   . Huntington's disease Paternal Grandmother     ADVANCED DIRECTIVES (Y/N):  N  HEALTH MAINTENANCE: Social History   Tobacco Use  . Smoking status: Former Smoker    Packs/day: 0.50    Years: 20.00    Pack years: 10.00    Types: Cigarettes    Last attempt to quit: 06/16/2017    Years since quitting: 1.1  . Smokeless tobacco: Never Used  Substance Use Topics  . Alcohol use: No    Alcohol/week: 0.0 standard drinks  . Drug use: No     Colonoscopy:  PAP:  Bone density:  Lipid panel:  Allergies  Allergen Reactions  . Bee Venom Swelling    SWELLING REACTION UNSPECIFIED  SEVERITY RATED HIGH FROM PMH  . Codeine Diarrhea, Nausea Only and Rash  . Sulfa Antibiotics Nausea And Vomiting    INCLUDES SPECIFICALLY SULFASALAZINE    Current Outpatient Medications  Medication Sig Dispense Refill  . aspirin EC 325 MG EC tablet Take 1 tablet (325 mg total) by mouth daily. 30 tablet 0  . atorvastatin (LIPITOR) 40 MG tablet Take 1 tablet (40 mg total) by mouth daily. 30 tablet 0  . isosorbide mononitrate (IMDUR) 30 MG 24 hr tablet Take 1 tablet (30 mg total) by mouth daily. 30 tablet 1  . promethazine (PHENERGAN) 12.5 MG tablet Take 1 tablet (12.5 mg total) by mouth every 6 (six) hours as needed for nausea or vomiting. 30 tablet 0  . ranitidine (ZANTAC)  150 MG tablet Take 1 tablet (150 mg total) by mouth at bedtime. 30 tablet 1  . simethicone (MYLICON) 80 MG chewable tablet Chew 1 tablet (80 mg total) by mouth 4 (four) times daily as needed for flatulence. 30 tablet 0  . traMADol (ULTRAM) 50 MG tablet Take 1 tablet (50 mg total) by mouth every 6 (six) hours as needed. (Patient not taking: Reported on 07/31/2018) 10 tablet 0  . triamcinolone ointment (KENALOG) 0.5 % Apply 1 application topically 2 (two) times daily. (Patient not taking: Reported on 07/31/2018) 30 g 0   No current facility-administered medications for this visit.     OBJECTIVE: Vitals:   07/31/18 1316  BP: 126/87  Pulse: 64  Resp: 16  Temp: (!) 96 F (35.6 C)     Body mass index is 14.55 kg/m.    ECOG FS:1 - Symptomatic but completely ambulatory  General: Cachectic, no acute distress. Eyes: Pink conjunctiva, anicteric sclera. HEENT:  Normocephalic, moist mucous membranes, clear oropharnyx. Lungs: Clear to auscultation bilaterally. Heart: Regular rate and rhythm. No rubs, murmurs, or gallops. Abdomen: Soft, nontender, nondistended. No organomegaly noted, normoactive bowel sounds. Musculoskeletal: No edema, cyanosis, or clubbing. Neuro: Alert, answering all questions appropriately. Cranial nerves grossly intact. Skin: No rashes or petechiae noted. Psych: Normal affect.   LAB RESULTS:  Lab Results  Component Value Date   NA 136 07/27/2018   K 3.9 07/27/2018   CL 103 07/27/2018   CO2 28 07/27/2018   GLUCOSE 122 (H) 07/27/2018   BUN 19 07/27/2018   CREATININE 0.84 07/27/2018   CALCIUM 9.2 07/27/2018   PROT 7.2 07/27/2018   ALBUMIN 4.0 07/27/2018   AST 49 (H) 07/27/2018   ALT 71 (H) 07/27/2018   ALKPHOS 52 07/27/2018   BILITOT 1.0 07/27/2018   GFRNONAA >60 07/27/2018   GFRAA >60 07/27/2018    Lab Results  Component Value Date   WBC 4.1 07/27/2018   NEUTROABS 2.5 07/27/2018   HGB 16.0 07/27/2018   HCT 46.1 07/27/2018   MCV 85.7 07/27/2018   PLT 222  07/27/2018     STUDIES: Ct Head Wo Contrast  Result Date: 07/28/2018 CLINICAL DATA:  52 year old male with altered mental status. EXAM: CT HEAD WITHOUT CONTRAST TECHNIQUE: Contiguous axial images were obtained from the base of the skull through the vertex without intravenous contrast. COMPARISON:  Brain MRI dated 04/19/2016 FINDINGS: Brain: Mild age-related atrophy and chronic microvascular ischemic changes. There is no acute intracranial hemorrhage. No mass effect or midline shift. No extra-axial fluid collection. Vascular: No hyperdense vessel or unexpected calcification. Skull: Normal. Negative for fracture or focal lesion. Sinuses/Orbits: No acute finding. Other: None IMPRESSION: 1. No acute intracranial pathology. 2. Age-related atrophy and chronic microvascular ischemic changes. Electronically Signed   By: Anner Crete M.D.   On: 07/28/2018 02:28   Ct Chest W Contrast  Result Date: 07/30/2018 CLINICAL DATA:  Neutropenia. Unintentional weight loss. Lymphadenopathy. EXAM: CT CHEST, ABDOMEN, AND PELVIS WITH CONTRAST TECHNIQUE: Multidetector CT imaging of the chest, abdomen and pelvis was performed following the standard protocol during bolus administration of intravenous contrast. CONTRAST:  163m ISOVUE-300 IOPAMIDOL (ISOVUE-300) INJECTION 61% COMPARISON:  Multiple exams, including CT abdomen 06/23/2018 and CT chest 04/03/2018 FINDINGS: The patient has an extreme paucity of adipose tissue which makes separation of adjacent structures problematic in the abdomen. What little adipose tissue there is in the subcutaneous tissues, mesentery, and omentum is edematous, with a density only subtly less than that of muscle. CT CHEST FINDINGS Cardiovascular: Prior CABG. Mediastinum/Nodes: Unremarkable. Lungs/Pleura: Unremarkable Musculoskeletal: Healing fracture of the right seventh rib with early callus formation, no fracture was visible in this vicinity on the 04/03/2018 exam. Old healed left lateral rib  fractures, chronic. Considerable degenerative findings of the left glenoid with several metal staples in the neck of the glenoid on the left. CT ABDOMEN PELVIS FINDINGS Hepatobiliary: No discrete liver lesion identified. Gallbladder unremarkable. Pancreas: Poor definition of pancreatic margins due to the cachexia and absence of intra-adipose tissues. No obvious pancreatic abnormality is seen. No dilation of the dorsal pancreatic duct. Spleen: Unremarkable Adrenals/Urinary Tract: Stable fullness of both adrenal glands without discrete mass. The kidneys appear unremarkable. Urinary bladder grossly unremarkable Stomach/Bowel: Prominent stool throughout the colon favors constipation. Vascular/Lymphatic: Aortoiliac atherosclerotic vascular disease. Small bilateral inguinal lymph nodes are not pathologically enlarged. Reproductive: Unremarkable Other: No supplemental non-categorized findings. Musculoskeletal: Old callus formation along the left inferior pubic ramus likely from an old fracture. Bridging fusion of both sacroiliac  joints. IMPRESSION: 1. Severe absence of adipose tissue/cachexia. What adipose tissue there is a somewhat edematous, possibly due to third spacing of fluid, resulting in poor definition of tissue planes especially in the abdomen. 2. Healing fracture of the right seventh rib, not present on the prior chest CT from 04/03/2018. 3.  Prominent stool throughout the colon favors constipation. 4.  Aortic Atherosclerosis (ICD10-I70.0).  Prior CABG. 5. Bridging fusion of both sacroiliac joints. 6. No specific findings attributable to malignancy. Bilateral inguinal lymph nodes are present and may feel especially prominent given the patient's lack of adipose tissues, but are sub-centimeter in size. Electronically Signed   By: Van Clines M.D.   On: 07/30/2018 15:45   Ct Abdomen Pelvis W Contrast  Result Date: 07/30/2018 CLINICAL DATA:  Neutropenia. Unintentional weight loss. Lymphadenopathy. EXAM:  CT CHEST, ABDOMEN, AND PELVIS WITH CONTRAST TECHNIQUE: Multidetector CT imaging of the chest, abdomen and pelvis was performed following the standard protocol during bolus administration of intravenous contrast. CONTRAST:  169m ISOVUE-300 IOPAMIDOL (ISOVUE-300) INJECTION 61% COMPARISON:  Multiple exams, including CT abdomen 06/23/2018 and CT chest 04/03/2018 FINDINGS: The patient has an extreme paucity of adipose tissue which makes separation of adjacent structures problematic in the abdomen. What little adipose tissue there is in the subcutaneous tissues, mesentery, and omentum is edematous, with a density only subtly less than that of muscle. CT CHEST FINDINGS Cardiovascular: Prior CABG. Mediastinum/Nodes: Unremarkable. Lungs/Pleura: Unremarkable Musculoskeletal: Healing fracture of the right seventh rib with early callus formation, no fracture was visible in this vicinity on the 04/03/2018 exam. Old healed left lateral rib fractures, chronic. Considerable degenerative findings of the left glenoid with several metal staples in the neck of the glenoid on the left. CT ABDOMEN PELVIS FINDINGS Hepatobiliary: No discrete liver lesion identified. Gallbladder unremarkable. Pancreas: Poor definition of pancreatic margins due to the cachexia and absence of intra-adipose tissues. No obvious pancreatic abnormality is seen. No dilation of the dorsal pancreatic duct. Spleen: Unremarkable Adrenals/Urinary Tract: Stable fullness of both adrenal glands without discrete mass. The kidneys appear unremarkable. Urinary bladder grossly unremarkable Stomach/Bowel: Prominent stool throughout the colon favors constipation. Vascular/Lymphatic: Aortoiliac atherosclerotic vascular disease. Small bilateral inguinal lymph nodes are not pathologically enlarged. Reproductive: Unremarkable Other: No supplemental non-categorized findings. Musculoskeletal: Old callus formation along the left inferior pubic ramus likely from an old fracture.  Bridging fusion of both sacroiliac joints. IMPRESSION: 1. Severe absence of adipose tissue/cachexia. What adipose tissue there is a somewhat edematous, possibly due to third spacing of fluid, resulting in poor definition of tissue planes especially in the abdomen. 2. Healing fracture of the right seventh rib, not present on the prior chest CT from 04/03/2018. 3.  Prominent stool throughout the colon favors constipation. 4.  Aortic Atherosclerosis (ICD10-I70.0).  Prior CABG. 5. Bridging fusion of both sacroiliac joints. 6. No specific findings attributable to malignancy. Bilateral inguinal lymph nodes are present and may feel especially prominent given the patient's lack of adipose tissues, but are sub-centimeter in size. Electronically Signed   By: WVan ClinesM.D.   On: 07/30/2018 15:45    ASSESSMENT: Neutropenia, unintentional weight loss, lymphadenopathy.  PLAN:   1.  Neutropenia: Resolved.  Patient's white blood cell count is now within normal limits.  All the remainder of his laboratory work including peripheral blood flow cytometry was also either negative or within normal limits.  No intervention is needed at this time.  Patient does not require bone marrow biopsy.   2.  Unintentional weight loss: Recent CT scans of  head, chest, abdomen, and pelvis did not reveal a distinct etiology.  Colonoscopy and EGD on January 31, 2016 were reported as normal.  Patient has no evidence of malignancy.  The remainder of his laboratory work including HIV is either negative or within normal limits.   3.  Lymphadenopathy: Scant.  Imaging as above.  Unclear if biopsy would elicit an etiology. 4.  Disposition: No follow-up has been scheduled at this time.  With no evidence of a distinct pathology, patient's weight loss appears to be nutritional.   Patient expressed understanding and was in agreement with this plan. He also understands that He can call clinic at any time with any questions, concerns, or  complaints.   Lloyd Huger, MD   08/04/2018 9:05 AM

## 2018-07-27 ENCOUNTER — Other Ambulatory Visit: Payer: Self-pay | Admitting: Oncology

## 2018-07-27 ENCOUNTER — Inpatient Hospital Stay: Payer: Medicaid Other

## 2018-07-27 ENCOUNTER — Other Ambulatory Visit: Payer: Self-pay

## 2018-07-27 ENCOUNTER — Ambulatory Visit: Payer: BLUE CROSS/BLUE SHIELD | Admitting: Surgery

## 2018-07-27 DIAGNOSIS — Z87891 Personal history of nicotine dependence: Secondary | ICD-10-CM | POA: Insufficient documentation

## 2018-07-27 DIAGNOSIS — R634 Abnormal weight loss: Secondary | ICD-10-CM

## 2018-07-27 DIAGNOSIS — M7918 Myalgia, other site: Secondary | ICD-10-CM | POA: Insufficient documentation

## 2018-07-27 DIAGNOSIS — I252 Old myocardial infarction: Secondary | ICD-10-CM | POA: Insufficient documentation

## 2018-07-27 DIAGNOSIS — I2510 Atherosclerotic heart disease of native coronary artery without angina pectoris: Secondary | ICD-10-CM | POA: Diagnosis not present

## 2018-07-27 DIAGNOSIS — I10 Essential (primary) hypertension: Secondary | ICD-10-CM | POA: Diagnosis not present

## 2018-07-27 DIAGNOSIS — Z79899 Other long term (current) drug therapy: Secondary | ICD-10-CM | POA: Insufficient documentation

## 2018-07-27 DIAGNOSIS — D709 Neutropenia, unspecified: Secondary | ICD-10-CM | POA: Diagnosis not present

## 2018-07-27 DIAGNOSIS — Z951 Presence of aortocoronary bypass graft: Secondary | ICD-10-CM | POA: Insufficient documentation

## 2018-07-27 DIAGNOSIS — R4182 Altered mental status, unspecified: Secondary | ICD-10-CM | POA: Diagnosis not present

## 2018-07-27 DIAGNOSIS — Z7982 Long term (current) use of aspirin: Secondary | ICD-10-CM | POA: Diagnosis not present

## 2018-07-27 LAB — CBC WITH DIFFERENTIAL/PLATELET
BASOS ABS: 0 10*3/uL (ref 0–0.1)
BASOS PCT: 1 %
Eosinophils Absolute: 0 10*3/uL (ref 0–0.7)
Eosinophils Relative: 1 %
HEMATOCRIT: 50.4 % (ref 40.0–52.0)
Hemoglobin: 17.2 g/dL (ref 13.0–18.0)
LYMPHS PCT: 32 %
Lymphs Abs: 1.4 10*3/uL (ref 1.0–3.6)
MCH: 29.1 pg (ref 26.0–34.0)
MCHC: 34.1 g/dL (ref 32.0–36.0)
MCV: 85.3 fL (ref 80.0–100.0)
Monocytes Absolute: 0.3 10*3/uL (ref 0.2–1.0)
Monocytes Relative: 7 %
NEUTROS ABS: 2.5 10*3/uL (ref 1.4–6.5)
NEUTROS PCT: 59 %
Platelets: 228 10*3/uL (ref 150–440)
RBC: 5.9 MIL/uL (ref 4.40–5.90)
RDW: 14.6 % — ABNORMAL HIGH (ref 11.5–14.5)
WBC: 4.2 10*3/uL (ref 3.8–10.6)

## 2018-07-27 LAB — LIPASE, BLOOD: Lipase: 43 U/L (ref 11–51)

## 2018-07-27 LAB — COMPREHENSIVE METABOLIC PANEL
ALBUMIN: 4.8 g/dL (ref 3.5–5.0)
ALK PHOS: 62 U/L (ref 38–126)
ALT: 92 U/L — ABNORMAL HIGH (ref 0–44)
ALT: 71 U/L — ABNORMAL HIGH (ref 0–44)
ANION GAP: 5 (ref 5–15)
AST: 71 U/L — ABNORMAL HIGH (ref 15–41)
AST: 49 U/L — ABNORMAL HIGH (ref 15–41)
Albumin: 4 g/dL (ref 3.5–5.0)
Alkaline Phosphatase: 52 U/L (ref 38–126)
Anion gap: 11 (ref 5–15)
BILIRUBIN TOTAL: 1.2 mg/dL (ref 0.3–1.2)
BUN: 16 mg/dL (ref 6–20)
BUN: 19 mg/dL (ref 6–20)
CALCIUM: 9.8 mg/dL (ref 8.9–10.3)
CALCIUM: 9.2 mg/dL (ref 8.9–10.3)
CHLORIDE: 103 mmol/L (ref 98–111)
CO2: 25 mmol/L (ref 22–32)
CO2: 28 mmol/L (ref 22–32)
Chloride: 98 mmol/L (ref 98–111)
Creatinine, Ser: 0.76 mg/dL (ref 0.61–1.24)
Creatinine, Ser: 0.84 mg/dL (ref 0.61–1.24)
GFR calc non Af Amer: >60 mL/min (ref >60)
GFR calc non Af Amer: >60 mL/min (ref >60)
GLUCOSE: 90 mg/dL (ref 70–99)
Glucose, Bld: 122 mg/dL — ABNORMAL HIGH (ref 70–99)
POTASSIUM: 4 mmol/L (ref 3.5–5.1)
POTASSIUM: 3.9 mmol/L (ref 3.5–5.1)
SODIUM: 134 mmol/L — AB (ref 135–145)
SODIUM: 136 mmol/L (ref 135–145)
TOTAL PROTEIN: 8.8 g/dL — AB (ref 6.5–8.1)
Total Bilirubin: 1 mg/dL (ref 0.3–1.2)
Total Protein: 7.2 g/dL (ref 6.5–8.1)

## 2018-07-27 LAB — CBC
HCT: 46.1 % (ref 40.0–52.0)
Hemoglobin: 16 g/dL (ref 13.0–18.0)
MCH: 29.7 pg (ref 26.0–34.0)
MCHC: 34.6 g/dL (ref 32.0–36.0)
MCV: 85.7 fL (ref 80.0–100.0)
Platelets: 222 10*3/uL (ref 150–440)
RBC: 5.38 MIL/uL (ref 4.40–5.90)
RDW: 14.7 % — ABNORMAL HIGH (ref 11.5–14.5)
WBC: 4.1 10*3/uL (ref 3.8–10.6)

## 2018-07-27 LAB — LACTATE DEHYDROGENASE: LDH: 258 U/L — ABNORMAL HIGH (ref 98–192)

## 2018-07-27 LAB — FOLATE: FOLATE: 16.4 ng/mL (ref >5.9)

## 2018-07-27 NOTE — ED Triage Notes (Signed)
Pt in with co generalized body aches and swelling. States also loosing a lot of weight recently, denies any n.v.d. States has been having symptoms for few months.

## 2018-07-27 NOTE — ED Notes (Signed)
Pt unable to give urine sample at this time; pt has specimen cup. 

## 2018-07-28 ENCOUNTER — Emergency Department
Admission: EM | Admit: 2018-07-28 | Discharge: 2018-07-28 | Disposition: A | Discharge status: Home / Self Care | Payer: Medicaid Other | Attending: Emergency Medicine | Admitting: Emergency Medicine

## 2018-07-28 ENCOUNTER — Emergency Department: Payer: Medicaid Other

## 2018-07-28 DIAGNOSIS — R52 Pain, unspecified: Secondary | ICD-10-CM

## 2018-07-28 LAB — VITAMIN B12: VITAMIN B 12: 558 pg/mL (ref 180–914)

## 2018-07-28 LAB — RAPID HIV SCREEN (HIV 1/2 AB+AG)
HIV 1/2 Antibodies: NONREACTIVE
HIV-1 P24 Antigen - HIV24: NONREACTIVE

## 2018-07-28 LAB — THYROID PANEL WITH TSH
Free Thyroxine Index: 3.2 (ref 1.2–4.9)
T3 Uptake Ratio: 32 % (ref 24–39)
T4, Total: 10 ug/dL (ref 4.5–12.0)
TSH: 2.42 u[IU]/mL (ref 0.450–4.500)

## 2018-07-28 LAB — HIV ANTIBODY (ROUTINE TESTING W REFLEX): HIV Screen 4th Generation wRfx: NONREACTIVE

## 2018-07-28 MED ORDER — RANITIDINE HCL 150 MG PO TABS: 150.0000 mg | ORAL_TABLET | Freq: Every day | ORAL | 1 refills | Status: AC

## 2018-07-28 MED ORDER — GI COCKTAIL ~~LOC~~
30.0000 mL | Freq: Once | ORAL | Status: AC
  Administered 2018-07-28: 30 mL via ORAL
  Filled 2018-07-28: qty 30

## 2018-07-28 NOTE — ED Provider Notes (Signed)
Medical screening examination/treatment/procedure(s) were conducted as a shared visit with non-physician practitioner(s) and myself.  I personally evaluated the patient during the encounter.  The patient is anxious with an odd affect and I suspect that he has a significant component of unspecified psychiatric illness.  However he meets no criteria for involuntary commitment or psychiatric admission at this time.  He has had 12 emergency department visits in the last 6 months with 0 admissions and he also tells me that he has been to his primary care doctor multiple times.  He has normal vital signs and his medical work-up is reassuring today.  He has very slight LFT elevations of unclear and uncertain significance but he had a CT scan within the last month that was normal and slight LFT elevation would not explain the symptoms he is describing.  His HIV testing today was negative, he has no leukocytosis, and metabolic panel is reassuring.  Head CT without contrast also shows no abnormalities.  Recent chest x-ray was also within normal limits.  I feel there is no indication for further imaging or testing tonight.  I encouraged him to follow-up with his PCP.  He asked for something for indigestion and I gave him a GI cocktail.  He also asked for a prescription for Zantac which I provided.  On exam he has lungs clear to auscultation bilaterally and no tenderness to palpation of his abdomen.  He is ambulatory without difficulty, fidgeting, and somewhat anxious but overall with no evidence of acute or emergent medical condition at this time.   Hinda Kehr, MD 07/28/18 [phone]

## 2018-07-28 NOTE — ED Provider Notes (Signed)
[facility] Emergency Department Provider Note  ____________________________________________   None    (approximate)  I have reviewed the triage vital signs and the nursing notes.   HISTORY  Chief Complaint Generalized Body Aches   HPI Jeffrey Prince is a 52 y.o. male who presents to the emergency department for treatment and evaluation of "radiating pain."  He states that the pain radiates from his Prince all the way down his body.  Is very difficult to follow his current complaint as he is unable to complete a thought.  Past Medical History:  Diagnosis Date  . Acne   . Anginal pain ([place])   . Blind left eye    Since birth  . Chorea   . Diverticulosis   . Dysrhythmia   . ED (erectile dysfunction)   . GERD (gastroesophageal reflux disease)   . Hemorrhoids   . Hypertension   . Weight loss    Abnormal    Patient Active Problem List   Diagnosis Date Noted  . Neutropenia ([place]) 07/26/2018  . Benign neoplasm of cecum   . Polyp of sigmoid colon   . Abnormal computed tomography of large intestine   . NSTEMI (non-ST elevated myocardial infarction) ([place]) 11/19/2017  . Coronary artery disease 11/03/2017  . ACS (acute coronary syndrome) ([place]) 10/31/2017  . Chest pain 10/30/2017  . Anal fissure   . Family history of Huntington's chorea 07/01/2017  . Unintentional weight loss 05/16/2015  . Diarrhea 05/16/2015  . GERD (gastroesophageal reflux disease) 05/16/2015  . Diffuse abdominal pain 05/16/2015  . Dyslipidemia (high LDL; low HDL) 01/25/2015  . Decreased visual acuity 01/24/2015  . Erectile dysfunction 01/24/2015  . Current every day smoker 01/24/2015  . 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 Hemorrhoids 03/16/2014    Past Surgical History:  Procedure Laterality Date  . BOTOX INJECTION N/A 10/09/2016   Procedure: BOTOX INJECTION;  Surgeon: Jules Husbands, MD;  Location: [facility];  Service: General;  Laterality: N/A;  . BOTOX INJECTION  N/A 07/23/2017   Procedure: BOTOX INJECTION;  Surgeon: Jules Husbands, MD;  Location: [facility];  Service: General;  Laterality: N/A;  . CARDIAC SURGERY    . COLONOSCOPY     with polyp removal  . COLONOSCOPY WITH PROPOFOL N/A 04/23/2018   Procedure: COLONOSCOPY WITH PROPOFOL;  Surgeon: Virgel Manifold, MD;  Location: [facility];  Service: Endoscopy;  Laterality: N/A;  . CORONARY ARTERY BYPASS GRAFT N/A 11/03/2017   Procedure: CORONARY ARTERY BYPASS GRAFTING (CABG) x four, using left internal mammry artery and right leg greater saphenous vein harvested endoscopically (LIMA to LAD, SVG to OM1, SVG to RAMUS INTERMEDIATE, SVG to PDA)  ;  Surgeon: Grace Isaac, MD;  Location: [place];  Service: Open Heart Surgery;  Laterality: N/A;  . EVALUATION UNDER ANESTHESIA WITH ANAL FISSUROTOMY N/A 10/09/2016   Procedure: EXAM UNDER ANESTHESIA WITH ANAL FISSUROTOMY chemical sphincterotomy w BOtox.;  Surgeon: Jules Husbands, MD;  Location: [facility];  Service: General;  Laterality: N/A;  Lithotomy, make sure botox is in the room  . EVALUATION UNDER ANESTHESIA WITH ANAL FISSUROTOMY N/A 07/23/2017   Procedure: EXAM UNDER ANESTHESIA WITH ANAL FISSUROTOMY, botox injection;  Surgeon: Jules Husbands, MD;  Location: [facility];  Service: General;  Laterality: N/A;  . HEMORRHOID SURGERY    . LEFT HEART CATH AND CORONARY ANGIOGRAPHY Right 10/30/2017   Procedure: LEFT HEART CATH AND CORONARY ANGIOGRAPHY;  Surgeon: Dionisio David, MD;  Location: [place] [facility];  Service: Cardiovascular;  Laterality: Right;  . LEFT HEART CATH AND CORONARY ANGIOGRAPHY Right 11/20/2017   Procedure: LEFT HEART CATH AND CORONARY ANGIOGRAPHY;  Surgeon: Dionisio David, MD;  Location: [place] [facility];  Service: Cardiovascular;  Laterality: Right;  . ROTATOR CUFF REPAIR  1991  . TEE WITHOUT CARDIOVERSION N/A 11/03/2017   Procedure: TRANSESOPHAGEAL ECHOCARDIOGRAM (TEE);  Surgeon: Grace Isaac, MD;  Location: [place];  Service:  Open Heart Surgery;  Laterality: N/A;    Prior to Admission medications   Medication Sig Start Date End Date Taking? Authorizing Provider  aspirin EC 325 MG EC tablet Take 1 tablet (325 mg total) by mouth daily. 11/08/17   Elgie Collard, PA-C  atorvastatin (LIPITOR) 40 MG tablet Take 1 tablet (40 mg total) by mouth daily. 11/08/17   Elgie Collard, PA-C  furosemide (LASIX) 40 MG tablet Take 1 tablet (40 mg total) by mouth daily for 5 days. Patient not taking: Reported on 05/28/2018 11/08/17 11/13/17  Elgie Collard, PA-C  isosorbide mononitrate (IMDUR) 30 MG 24 hr tablet Take 1 tablet (30 mg total) by mouth daily. 11/20/17   Henreitta Leber, MD  potassium chloride SA (K-DUR,KLOR-CON) 20 MEQ tablet Take 1 tablet (20 mEq total) by mouth daily for 5 days. Patient not taking: Reported on 05/28/2018 11/08/17 11/19/17  Elgie Collard, PA-C  promethazine (PHENERGAN) 12.5 MG tablet Take 1 tablet (12.5 mg total) by mouth every 6 (six) hours as needed for nausea or vomiting. 07/12/18   Gregor Hams, MD  ranitidine (ZANTAC) 150 MG tablet Take 1 tablet (150 mg total) by mouth at bedtime. 07/28/18 07/28/19  Hinda Kehr, MD  simethicone (MYLICON) 80 MG chewable tablet Chew 1 tablet (80 mg total) by mouth 4 (four) times daily as needed for flatulence. 11/08/17   Elgie Collard, PA-C  traMADol (ULTRAM) 50 MG tablet Take 1 tablet (50 mg total) by mouth every 6 (six) hours as needed. 04/03/18 04/03/19  Merlyn Lot, MD  triamcinolone ointment (KENALOG) 0.5 % Apply 1 application topically 2 (two) times daily. 05/25/18   Rudene Re, MD    Allergies Bee venom; Codeine; and Sulfa antibiotics  Family History  Problem Relation Age of Onset  . Breast cancer Mother   . Huntington's disease Father   . Cancer Paternal Uncle        Colon  . Diabetes Son   . Cancer Maternal Grandfather   . Cancer Paternal Grandmother   . Heart disease Paternal Grandmother   . Huntington's disease Paternal Grandmother     Social  History Social History   Tobacco Use  . Smoking status: Former Smoker    Packs/day: 0.50    Years: 20.00    Pack years: 10.00    Types: Cigarettes    Last attempt to quit: 06/16/2017    Years since quitting: 1.1  . Smokeless tobacco: Never Used  Substance Use Topics  . Alcohol use: No    Alcohol/week: 0.0 standard drinks  . Drug use: No    Review of Systems  Constitutional: No fever/chills Eyes: No visual changes. ENT: No sore throat. Cardiovascular: Denies chest pain. Respiratory: Denies shortness of breath. Gastrointestinal: No abdominal pain.  No nausea, no vomiting.  No diarrhea.  No constipation. Genitourinary: Negative for dysuria. Musculoskeletal: Positive for diffuse pain from Prince to toes. Skin: Negative for rash. Neurological: Negative for headaches, focal weakness or numbness. ____________________________________________   PHYSICAL EXAM:  VITAL SIGNS: ED Triage Vitals  Enc Vitals Group  BP 07/27/18 1924 124/86     Pulse Rate 07/27/18 1924 70     Resp 07/27/18 1924 16     Temp 07/27/18 1924 97.6 F (36.4 C)     Temp Source 07/27/18 1924 Oral     SpO2 07/27/18 1924 100 %     Weight 07/27/18 1924 100 lb (45.4 kg)     Height 07/27/18 1924 5\' 10"  (1.778 m)     Prince Circumference --      Peak Flow --      Pain Score 07/27/18 1930 6     Pain Loc --      Pain Edu? --      Excl. in [place]? --     Constitutional: Alert and oriented. Chronically ill appearing and in no acute distress.  Emaciated.  Eyes: Conjunctivae are normal. Prince: Atraumatic. Nose: No congestion/rhinnorhea. Mouth/Throat: Mucous membranes are dry.  Oropharynx non-erythematous. Neck: No stridor.   Cardiovascular: Normal rate, regular rhythm. Grossly normal heart sounds.  Good peripheral circulation. Respiratory: Normal respiratory effort.  No retractions. Lungs CTAB. Gastrointestinal: Soft. No distention. No abdominal bruits. No CVA tenderness. Musculoskeletal: Performs ROM. Neurologic:  Unable to follow direct commands.  Unable to finish a sentence without changing topics.  Constant movement of the body. Skin:  Skin is warm, dry and intact. No rash noted. Psychiatric: Speech is unconnected and behavior is sporadic.  ____________________________________________   LABS (all labs ordered are listed, but only abnormal results are displayed)  Labs Reviewed  CBC - Abnormal; Notable for the following components:      Result Value   RDW 14.7 (*)    All other components within normal limits  COMPREHENSIVE METABOLIC PANEL - Abnormal; Notable for the following components:   Glucose, Bld 122 (*)    AST 49 (*)    ALT 71 (*)    All other components within normal limits  LIPASE, BLOOD  RAPID HIV SCREEN (HIV 1/2 AB+AG)   ____________________________________________  EKG  Not indicated ____________________________________________  RADIOLOGY  ED MD interpretation:  CT Prince negative for acute abnormality per radiology.  Official radiology report(s): No results found.  ____________________________________________   PROCEDURES  Procedure(s) performed: None  Procedures  Critical Care performed: No  ____________________________________________   INITIAL IMPRESSION / ASSESSMENT AND PLAN / ED COURSE  As part of my medical decision making, I reviewed the following data within the electronic MEDICAL RECORD NUMBER Notes from prior ED visits   52 year old male presenting to the emergency department for treatment and evaluation of what he describes as radiating pain from his Prince throughout his body.  Patient is having very difficult time describing his symptoms and staying on track.  His thought content is distracted.  He denies injury or recent falls but then states that he fell onto his right knee.  He has had multiple visits to the emergency department over the past 6 months, however I have not seen him and do not know whether this is his baseline.  I do see where he had  labs drawn that were ordered for work-up of neutropenia, but see no mention of abnormal behavior as he is demonstrating at this time.  Therefore, CT of the Prince has been requested in addition to a urine drug screen. Also, we will do rapid HIV.  Patient care relinquished to Dr. Karma Greaser who will evaluate and make a final disposition.      ____________________________________________   FINAL CLINICAL IMPRESSION(S) / ED DIAGNOSES  Final diagnoses:  Generalized body  aches     ED Discharge Orders         Ordered    ranitidine (ZANTAC) 150 MG tablet  Daily at bedtime     07/28/18 0347           Note:  This document was prepared using Dragon voice recognition software and may include unintentional dictation errors.    Victorino Dike, FNP 07/29/18 2016    Hinda Kehr, MD 07/30/18 [phone]

## 2018-07-28 NOTE — ED Notes (Signed)
Pt states that he can not provide a urine sample right now.

## 2018-07-28 NOTE — Discharge Instructions (Signed)
Your workup in the Emergency Department today was reassuring.  We did not find any specific abnormalities.  We recommend you drink plenty of fluids, take your regular medications and/or any new ones prescribed today, and follow up with the doctor(s) listed in these documents as recommended.  Return to the Emergency Department if you develop new or worsening symptoms that concern you.  

## 2018-07-28 NOTE — ED Notes (Signed)
Mallie Mussel RN called the lab to add that rapid HIV to the existing sample. Lab added the test.

## 2018-07-30 ENCOUNTER — Ambulatory Visit
Admission: RE | Admit: 2018-07-30 | Discharge: 2018-07-30 | Disposition: A | Discharge status: Home / Self Care | Payer: Medicaid Other | Source: Ambulatory Visit | Attending: Oncology | Admitting: Oncology

## 2018-07-30 DIAGNOSIS — X58XXXD Exposure to other specified factors, subsequent encounter: Secondary | ICD-10-CM | POA: Insufficient documentation

## 2018-07-30 DIAGNOSIS — R591 Generalized enlarged lymph nodes: Secondary | ICD-10-CM | POA: Diagnosis not present

## 2018-07-30 DIAGNOSIS — S2231XD Fracture of one rib, right side, subsequent encounter for fracture with routine healing: Secondary | ICD-10-CM | POA: Insufficient documentation

## 2018-07-30 DIAGNOSIS — K5900 Constipation, unspecified: Secondary | ICD-10-CM | POA: Diagnosis not present

## 2018-07-30 DIAGNOSIS — Z951 Presence of aortocoronary bypass graft: Secondary | ICD-10-CM | POA: Diagnosis not present

## 2018-07-30 DIAGNOSIS — I700 Atherosclerosis of aorta: Secondary | ICD-10-CM | POA: Diagnosis not present

## 2018-07-30 MED ORDER — IOPAMIDOL (ISOVUE-300) INJECTION 61%
100.0000 mL | Freq: Once | INTRAVENOUS | Status: AC | PRN
  Administered 2018-07-30: 100 mL via INTRAVENOUS

## 2018-07-31 ENCOUNTER — Encounter: Payer: Self-pay | Admitting: Oncology

## 2018-07-31 ENCOUNTER — Inpatient Hospital Stay (HOSPITAL_BASED_OUTPATIENT_CLINIC_OR_DEPARTMENT_OTHER): Payer: Medicaid Other | Admitting: Oncology

## 2018-07-31 VITALS — BP 126/87 | HR 64 | Temp 96.0°F | Resp 16 | Wt 101.4 lb

## 2018-07-31 DIAGNOSIS — D709 Neutropenia, unspecified: Secondary | ICD-10-CM

## 2018-07-31 DIAGNOSIS — R591 Generalized enlarged lymph nodes: Secondary | ICD-10-CM

## 2018-07-31 DIAGNOSIS — Z87891 Personal history of nicotine dependence: Secondary | ICD-10-CM

## 2018-07-31 DIAGNOSIS — R630 Anorexia: Secondary | ICD-10-CM

## 2018-07-31 DIAGNOSIS — R634 Abnormal weight loss: Secondary | ICD-10-CM

## 2018-07-31 LAB — COMP PANEL: LEUKEMIA/LYMPHOMA

## 2018-07-31 NOTE — Progress Notes (Signed)
Pt in for 1 week follow up reports "nothing has changed since last week" .

## 2018-08-03 ENCOUNTER — Encounter: Payer: Self-pay | Admitting: Emergency Medicine

## 2018-08-03 ENCOUNTER — Emergency Department
Admission: EM | Admit: 2018-08-03 | Discharge: 2018-08-03 | Disposition: A | Discharge status: Home / Self Care | Payer: Medicaid Other | Attending: Emergency Medicine | Admitting: Emergency Medicine

## 2018-08-03 ENCOUNTER — Other Ambulatory Visit: Payer: Self-pay

## 2018-08-03 DIAGNOSIS — Z7982 Long term (current) use of aspirin: Secondary | ICD-10-CM | POA: Insufficient documentation

## 2018-08-03 DIAGNOSIS — I10 Essential (primary) hypertension: Secondary | ICD-10-CM | POA: Insufficient documentation

## 2018-08-03 DIAGNOSIS — Z87891 Personal history of nicotine dependence: Secondary | ICD-10-CM | POA: Insufficient documentation

## 2018-08-03 DIAGNOSIS — J029 Acute pharyngitis, unspecified: Secondary | ICD-10-CM | POA: Diagnosis present

## 2018-08-03 DIAGNOSIS — I2510 Atherosclerotic heart disease of native coronary artery without angina pectoris: Secondary | ICD-10-CM | POA: Insufficient documentation

## 2018-08-03 DIAGNOSIS — Z79899 Other long term (current) drug therapy: Secondary | ICD-10-CM | POA: Diagnosis not present

## 2018-08-03 DIAGNOSIS — R070 Pain in throat: Secondary | ICD-10-CM

## 2018-08-03 MED ORDER — GI COCKTAIL ~~LOC~~
30.0000 mL | Freq: Once | ORAL | Status: AC
  Administered 2018-08-03: 30 mL via ORAL
  Filled 2018-08-03: qty 30

## 2018-08-03 NOTE — ED Provider Notes (Signed)
[facility] Emergency Department Provider Note   ____________________________________________   First MD Initiated Contact with Patient 08/03/18 [phone]     (approximate)  I have reviewed the triage vital signs and the nursing notes.   HISTORY  Chief Complaint Grease in throat from burger    HPI Jeffrey Prince is a 52 y.o. male who presents to the ED from home with a chief complaint of throat pain.  Patient reports he ate chicken wings and then he bit into a burger which scored a grease in his throat.  Complains of sore throat.  Denies associated fever, chills, breathing difficulty, chest pain, wheezing, abdominal pain, nausea or vomiting.  Denies esophageal food bolus sensation.   Past Medical History:  Diagnosis Date  . Acne   . Anginal pain ([place])   . Blind left eye    Since birth  . Chorea   . Diverticulosis   . Dysrhythmia   . ED (erectile dysfunction)   . GERD (gastroesophageal reflux disease)   . Hemorrhoids   . Hypertension   . Weight loss    Abnormal    Patient Active Problem List   Diagnosis Date Noted  . Neutropenia ([place]) 07/26/2018  . Benign neoplasm of cecum   . Polyp of sigmoid colon   . Abnormal computed tomography of large intestine   . NSTEMI (non-ST elevated myocardial infarction) ([place]) 11/19/2017  . Coronary artery disease 11/03/2017  . ACS (acute coronary syndrome) ([place]) 10/31/2017  . Chest pain 10/30/2017  . Anal fissure   . Family history of Huntington's chorea 07/01/2017  . Unintentional weight loss 05/16/2015  . Diarrhea 05/16/2015  . GERD (gastroesophageal reflux disease) 05/16/2015  . Diffuse abdominal pain 05/16/2015  . Dyslipidemia (high LDL; low HDL) 01/25/2015  . Decreased visual acuity 01/24/2015  . Erectile dysfunction 01/24/2015  . Current every day smoker 01/24/2015  . 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 Hemorrhoids 03/16/2014    Past Surgical History:  Procedure Laterality Date  .  BOTOX INJECTION N/A 10/09/2016   Procedure: BOTOX INJECTION;  Surgeon: Jules Husbands, MD;  Location: [facility];  Service: General;  Laterality: N/A;  . BOTOX INJECTION N/A 07/23/2017   Procedure: BOTOX INJECTION;  Surgeon: Jules Husbands, MD;  Location: [facility];  Service: General;  Laterality: N/A;  . CARDIAC SURGERY    . COLONOSCOPY     with polyp removal  . COLONOSCOPY WITH PROPOFOL N/A 04/23/2018   Procedure: COLONOSCOPY WITH PROPOFOL;  Surgeon: Virgel Manifold, MD;  Location: [facility];  Service: Endoscopy;  Laterality: N/A;  . CORONARY ARTERY BYPASS GRAFT N/A 11/03/2017   Procedure: CORONARY ARTERY BYPASS GRAFTING (CABG) x four, using left internal mammry artery and right leg greater saphenous vein harvested endoscopically (LIMA to LAD, SVG to OM1, SVG to RAMUS INTERMEDIATE, SVG to PDA)  ;  Surgeon: Grace Isaac, MD;  Location: [place];  Service: Open Heart Surgery;  Laterality: N/A;  . EVALUATION UNDER ANESTHESIA WITH ANAL FISSUROTOMY N/A 10/09/2016   Procedure: EXAM UNDER ANESTHESIA WITH ANAL FISSUROTOMY chemical sphincterotomy w BOtox.;  Surgeon: Jules Husbands, MD;  Location: [facility];  Service: General;  Laterality: N/A;  Lithotomy, make sure botox is in the room  . EVALUATION UNDER ANESTHESIA WITH ANAL FISSUROTOMY N/A 07/23/2017   Procedure: EXAM UNDER ANESTHESIA WITH ANAL FISSUROTOMY, botox injection;  Surgeon: Jules Husbands, MD;  Location: [facility];  Service: General;  Laterality: N/A;  . HEMORRHOID SURGERY    . LEFT  HEART CATH AND CORONARY ANGIOGRAPHY Right 10/30/2017   Procedure: LEFT HEART CATH AND CORONARY ANGIOGRAPHY;  Surgeon: Dionisio David, MD;  Location: [place] [facility];  Service: Cardiovascular;  Laterality: Right;  . LEFT HEART CATH AND CORONARY ANGIOGRAPHY Right 11/20/2017   Procedure: LEFT HEART CATH AND CORONARY ANGIOGRAPHY;  Surgeon: Dionisio David, MD;  Location: [place] [facility];  Service: Cardiovascular;  Laterality: Right;  . ROTATOR CUFF REPAIR   1991  . TEE WITHOUT CARDIOVERSION N/A 11/03/2017   Procedure: TRANSESOPHAGEAL ECHOCARDIOGRAM (TEE);  Surgeon: Grace Isaac, MD;  Location: [place];  Service: Open Heart Surgery;  Laterality: N/A;    Prior to Admission medications   Medication Sig Start Date End Date Taking? Authorizing Provider  aspirin EC 325 MG EC tablet Take 1 tablet (325 mg total) by mouth daily. 11/08/17   Elgie Collard, PA-C  atorvastatin (LIPITOR) 40 MG tablet Take 1 tablet (40 mg total) by mouth daily. 11/08/17   Elgie Collard, PA-C  isosorbide mononitrate (IMDUR) 30 MG 24 hr tablet Take 1 tablet (30 mg total) by mouth daily. 11/20/17   Henreitta Leber, MD  promethazine (PHENERGAN) 12.5 MG tablet Take 1 tablet (12.5 mg total) by mouth every 6 (six) hours as needed for nausea or vomiting. 07/12/18   Gregor Hams, MD  ranitidine (ZANTAC) 150 MG tablet Take 1 tablet (150 mg total) by mouth at bedtime. 07/28/18 07/28/19  Hinda Kehr, MD  simethicone (MYLICON) 80 MG chewable tablet Chew 1 tablet (80 mg total) by mouth 4 (four) times daily as needed for flatulence. 11/08/17   Elgie Collard, PA-C  traMADol (ULTRAM) 50 MG tablet Take 1 tablet (50 mg total) by mouth every 6 (six) hours as needed. Patient not taking: Reported on 07/31/2018 04/03/18 04/03/19  Merlyn Lot, MD  triamcinolone ointment (KENALOG) 0.5 % Apply 1 application topically 2 (two) times daily. Patient not taking: Reported on 07/31/2018 05/25/18   Rudene Re, MD    Allergies Bee venom; Codeine; and Sulfa antibiotics  Family History  Problem Relation Age of Onset  . Breast cancer Mother   . Huntington's disease Father   . Cancer Paternal Uncle        Colon  . Diabetes Son   . Cancer Maternal Grandfather   . Cancer Paternal Grandmother   . Heart disease Paternal Grandmother   . Huntington's disease Paternal Grandmother     Social History Social History   Tobacco Use  . Smoking status: Former Smoker    Packs/day: 0.50    Years:  20.00    Pack years: 10.00    Types: Cigarettes    Last attempt to quit: 06/16/2017    Years since quitting: 1.1  . Smokeless tobacco: Never Used  Substance Use Topics  . Alcohol use: No    Alcohol/week: 0.0 standard drinks  . Drug use: No    Review of Systems  Constitutional: No fever/chills Eyes: No visual changes. ENT: Positive for sore throat. Cardiovascular: Denies chest pain. Respiratory: Denies shortness of breath. Gastrointestinal: No abdominal pain.  No nausea, no vomiting.  No diarrhea.  No constipation. Genitourinary: Negative for dysuria. Musculoskeletal: Negative for back pain. Skin: Negative for rash. Neurological: Negative for headaches, focal weakness or numbness.   ____________________________________________   PHYSICAL EXAM:  VITAL SIGNS: ED Triage Vitals  Enc Vitals Group     BP 08/03/18 0417 97/85     Pulse Rate 08/03/18 0417 86     Resp 08/03/18 0417  18     Temp 08/03/18 0417 98.3 F (36.8 C)     Temp Source 08/03/18 0417 Oral     SpO2 08/03/18 0417 100 %     Weight 08/03/18 0418 101 lb 6.6 oz (46 kg)     Height 08/03/18 0418 5\' 10"  (1.778 m)     Head Circumference --      Peak Flow --      Pain Score 08/03/18 0418 6     Pain Loc --      Pain Edu? --      Excl. in [place]? --     Constitutional: Alert and oriented.  Cachectic appearing and in no acute distress. Eyes: Conjunctivae are normal. PERRL. EOMI. Head: Atraumatic. Nose: No congestion/rhinnorhea. Mouth/Throat: Mucous membranes are moist.  Oropharynx non-erythematous.  No burns noted to posterior oropharynx. Neck: No stridor.  No palpable neck masses. Cardiovascular: Normal rate, regular rhythm. Grossly normal heart sounds.  Good peripheral circulation. Respiratory: Normal respiratory effort.  No retractions. Lungs CTAB. Gastrointestinal: Soft and nontender. No distention. No abdominal bruits. No CVA tenderness. Musculoskeletal: No lower extremity tenderness nor edema.  No joint  effusions. Neurologic:  Normal speech and language. No gross focal neurologic deficits are appreciated. No gait instability. Skin:  Skin is warm, dry and intact. No rash noted. Psychiatric: Mood and affect are flat. Speech and behavior are normal.  ____________________________________________   LABS (all labs ordered are listed, but only abnormal results are displayed)  Labs Reviewed - No data to display ____________________________________________  EKG  None ____________________________________________  RADIOLOGY  ED MD interpretation: None  Official radiology report(s): No results found.  ____________________________________________   PROCEDURES  Procedure(s) performed: None  Procedures  Critical Care performed: No  ____________________________________________   INITIAL IMPRESSION / ASSESSMENT AND PLAN / ED COURSE  As part of my medical decision making, I reviewed the following data within the [place] History obtained from family, Nursing notes reviewed and incorporated, Old chart reviewed and Notes from prior ED visits   51 year old male who presents with throat pain after biting into a greasy burger.  Patient was given a carbonated soda while awaiting a treatment room and drink this without difficulty.  Low suspicion for esophageal stricture/esophageal food bolus.  Will administer GI cocktail and patient will follow-up with his PCP.  Strict return precautions given.  Patient and family member verbalize understanding and agree with plan of care.      ____________________________________________   FINAL CLINICAL IMPRESSION(S) / ED DIAGNOSES  Final diagnoses:  Throat pain in adult     ED Discharge Orders    None       Note:  This document was prepared using Dragon voice recognition software and may include unintentional dictation errors.    Paulette Blanch, MD 08/03/18 [phone]

## 2018-08-03 NOTE — ED Triage Notes (Signed)
Pt arrives to ED POV with c/o grease in throat. Pt reports that first he had some chicken that was spicy and then he ate a greasy burger. Pt is able to handle secretions at this time, talk in complete sentences, and is in NAD.

## 2018-08-03 NOTE — ED Notes (Signed)
Pt is able to handle secretions.

## 2018-08-03 NOTE — Discharge Instructions (Addendum)
Drink plenty of liquids daily.  Return to the ER for worsening symptoms, persistent vomiting, difficulty breathing or other concerns.

## 2018-08-04 LAB — NEUTROPHIL AB TEST LEVEL 1: NEUTROPHIL SCR/PANEL INTERP.: NEGATIVE

## 2018-08-08 ENCOUNTER — Emergency Department
Admission: EM | Admit: 2018-08-08 | Discharge: 2018-08-08 | Disposition: A | Discharge status: Home / Self Care | Payer: Medicaid Other | Attending: Emergency Medicine | Admitting: Emergency Medicine

## 2018-08-08 ENCOUNTER — Encounter: Payer: Self-pay | Admitting: Emergency Medicine

## 2018-08-08 ENCOUNTER — Emergency Department: Payer: Medicaid Other

## 2018-08-08 ENCOUNTER — Other Ambulatory Visit: Payer: Self-pay

## 2018-08-08 DIAGNOSIS — I252 Old myocardial infarction: Secondary | ICD-10-CM | POA: Insufficient documentation

## 2018-08-08 DIAGNOSIS — G10 Huntington's disease: Secondary | ICD-10-CM | POA: Insufficient documentation

## 2018-08-08 DIAGNOSIS — Z7982 Long term (current) use of aspirin: Secondary | ICD-10-CM | POA: Insufficient documentation

## 2018-08-08 DIAGNOSIS — Z87891 Personal history of nicotine dependence: Secondary | ICD-10-CM | POA: Diagnosis not present

## 2018-08-08 DIAGNOSIS — I10 Essential (primary) hypertension: Secondary | ICD-10-CM | POA: Diagnosis not present

## 2018-08-08 DIAGNOSIS — F0280 Dementia in other diseases classified elsewhere without behavioral disturbance: Secondary | ICD-10-CM | POA: Diagnosis not present

## 2018-08-08 DIAGNOSIS — E785 Hyperlipidemia, unspecified: Secondary | ICD-10-CM | POA: Insufficient documentation

## 2018-08-08 DIAGNOSIS — G3109 Other frontotemporal dementia: Secondary | ICD-10-CM | POA: Insufficient documentation

## 2018-08-08 DIAGNOSIS — R079 Chest pain, unspecified: Secondary | ICD-10-CM | POA: Insufficient documentation

## 2018-08-08 DIAGNOSIS — Z79899 Other long term (current) drug therapy: Secondary | ICD-10-CM | POA: Insufficient documentation

## 2018-08-08 HISTORY — DX: Panic disorder (episodic paroxysmal anxiety): F41.0

## 2018-08-08 LAB — CBC WITH DIFFERENTIAL/PLATELET
BASOS ABS: 0 10*3/uL (ref 0–0.1)
Basophils Relative: 1 %
Eosinophils Absolute: 0 10*3/uL (ref 0–0.7)
Eosinophils Relative: 0 %
HCT: 52.6 % — ABNORMAL HIGH (ref 40.0–52.0)
Hemoglobin: 18 g/dL (ref 13.0–18.0)
Lymphocytes Relative: 18 %
Lymphs Abs: 0.8 10*3/uL — ABNORMAL LOW (ref 1.0–3.6)
MCH: 29.4 pg (ref 26.0–34.0)
MCHC: 34.1 g/dL (ref 32.0–36.0)
MCV: 86.3 fL (ref 80.0–100.0)
Monocytes Absolute: 0.2 10*3/uL (ref 0.2–1.0)
Monocytes Relative: 6 %
Neutro Abs: 3.1 10*3/uL (ref 1.4–6.5)
Neutrophils Relative %: 75 %
Platelets: 246 10*3/uL (ref 150–440)
RBC: 6.1 MIL/uL — AB (ref 4.40–5.90)
RDW: 15.1 % — ABNORMAL HIGH (ref 11.5–14.5)
WBC: 4.2 10*3/uL (ref 3.8–10.6)

## 2018-08-08 LAB — COMPREHENSIVE METABOLIC PANEL
ALT: 103 U/L — ABNORMAL HIGH (ref 0–44)
ANION GAP: 7 (ref 5–15)
AST: 68 U/L — ABNORMAL HIGH (ref 15–41)
Albumin: 4.6 g/dL (ref 3.5–5.0)
Alkaline Phosphatase: 60 U/L (ref 38–126)
BUN: 36 mg/dL — ABNORMAL HIGH (ref 6–20)
CO2: 29 mmol/L (ref 22–32)
Calcium: 10.1 mg/dL (ref 8.9–10.3)
Chloride: 105 mmol/L (ref 98–111)
Creatinine, Ser: 0.86 mg/dL (ref 0.61–1.24)
GFR calc Af Amer: >60 mL/min (ref >60)
Glucose, Bld: 105 mg/dL — ABNORMAL HIGH (ref 70–99)
Potassium: 4.5 mmol/L (ref 3.5–5.1)
Sodium: 141 mmol/L (ref 135–145)
TOTAL PROTEIN: 8.3 g/dL — AB (ref 6.5–8.1)
Total Bilirubin: 1.3 mg/dL — ABNORMAL HIGH (ref 0.3–1.2)

## 2018-08-08 LAB — LIPASE, BLOOD: LIPASE: 28 U/L (ref 11–51)

## 2018-08-08 LAB — TROPONIN I: Troponin I: <0.03 ng/mL (ref <0.03)

## 2018-08-08 MED ORDER — LORAZEPAM 2 MG PO TABS
2.0000 mg | ORAL_TABLET | Freq: Once | ORAL | Status: AC
  Administered 2018-08-08: 2 mg via ORAL
  Filled 2018-08-08: qty 1

## 2018-08-08 NOTE — ED Triage Notes (Signed)
Pt reports fall tonight without LOC, reports ongoing weakness, pain at right knee

## 2018-08-08 NOTE — ED Notes (Signed)
Discharge instructions reviewed with patient. Questions fielded by this RN. Patient verbalizes understanding of instructions. Patient discharged home in stable condition per forbach. No acute distress noted at time of discharge.   Peripheral IV discontinued. Catheter intact. No signs of infiltration or redness. Gauze applied to IV site.

## 2018-08-08 NOTE — ED Provider Notes (Signed)
[facility] Emergency Department Provider Note  ____________________________________________   First MD Initiated Contact with Patient 08/08/18 0030     (approximate)  I have reviewed the triage vital signs and the nursing notes.   HISTORY  Chief Complaint Chest Pain  Level 5 caveat:  history/ROS limited by chronic neurological/psychiatric illness (Huntington's disease).   HPI Jeffrey Prince is a 52 y.o. male whose medical history as listed below and most notable for Huntington's disease, verified by family members at bedside.  He presents by EMS for evaluation of chest pain and what he states feels like a "anxiety attack".  He has had 14 ED visits in the last 6 months with no admissions and has reported chest pain in the past.  He is not able to provide much in the way of history but says that it feels about the same but he is hungry and would like chicken, or a [place] sandwich if we do not have chicken.  He is not having any difficulty breathing and denies fever/chills as well as abdominal pain.  He may have had a fall earlier but is at his baseline according to his aunt and uncle who are present at bedside.  Past Medical History:  Diagnosis Date  . Acne   . Anginal pain ([place])   . Blind left eye    Since birth  . Chorea   . Diverticulosis   . Dysrhythmia   . ED (erectile dysfunction)   . GERD (gastroesophageal reflux disease)   . Hemorrhoids   . Huntington's disease ([place])    verified by family member  . Hypertension   . Panic attack   . Weight loss    Abnormal    Patient Active Problem List   Diagnosis Date Noted  . Neutropenia ([place]) 07/26/2018  . Benign neoplasm of cecum   . Polyp of sigmoid colon   . Abnormal computed tomography of large intestine   . NSTEMI (non-ST elevated myocardial infarction) ([place]) 11/19/2017  . Coronary artery disease 11/03/2017  . ACS (acute coronary syndrome) ([place]) 10/31/2017  . Chest pain 10/30/2017  . Anal  fissure   . Family history of Huntington's chorea 07/01/2017  . Unintentional weight loss 05/16/2015  . Diarrhea 05/16/2015  . GERD (gastroesophageal reflux disease) 05/16/2015  . Diffuse abdominal pain 05/16/2015  . Dyslipidemia (high LDL; low HDL) 01/25/2015  . Decreased visual acuity 01/24/2015  . Erectile dysfunction 01/24/2015  . Current every day smoker 01/24/2015  . 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 Hemorrhoids 03/16/2014    Past Surgical History:  Procedure Laterality Date  . BOTOX INJECTION N/A 10/09/2016   Procedure: BOTOX INJECTION;  Surgeon: Jules Husbands, MD;  Location: [facility];  Service: General;  Laterality: N/A;  . BOTOX INJECTION N/A 07/23/2017   Procedure: BOTOX INJECTION;  Surgeon: Jules Husbands, MD;  Location: [facility];  Service: General;  Laterality: N/A;  . CARDIAC SURGERY    . COLONOSCOPY     with polyp removal  . COLONOSCOPY WITH PROPOFOL N/A 04/23/2018   Procedure: COLONOSCOPY WITH PROPOFOL;  Surgeon: Virgel Manifold, MD;  Location: [facility];  Service: Endoscopy;  Laterality: N/A;  . CORONARY ARTERY BYPASS GRAFT N/A 11/03/2017   Procedure: CORONARY ARTERY BYPASS GRAFTING (CABG) x four, using left internal mammry artery and right leg greater saphenous vein harvested endoscopically (LIMA to LAD, SVG to OM1, SVG to RAMUS INTERMEDIATE, SVG to PDA)  ;  Surgeon: Grace Isaac, MD;  Location: [place];  Service: Open Heart Surgery;  Laterality: N/A;  . EVALUATION UNDER ANESTHESIA WITH ANAL FISSUROTOMY N/A 10/09/2016   Procedure: EXAM UNDER ANESTHESIA WITH ANAL FISSUROTOMY chemical sphincterotomy w BOtox.;  Surgeon: Jules Husbands, MD;  Location: [facility];  Service: General;  Laterality: N/A;  Lithotomy, make sure botox is in the room  . EVALUATION UNDER ANESTHESIA WITH ANAL FISSUROTOMY N/A 07/23/2017   Procedure: EXAM UNDER ANESTHESIA WITH ANAL FISSUROTOMY, botox injection;  Surgeon: Jules Husbands, MD;  Location: [facility];  Service: General;   Laterality: N/A;  . HEMORRHOID SURGERY    . LEFT HEART CATH AND CORONARY ANGIOGRAPHY Right 10/30/2017   Procedure: LEFT HEART CATH AND CORONARY ANGIOGRAPHY;  Surgeon: Dionisio David, MD;  Location: [place] [facility];  Service: Cardiovascular;  Laterality: Right;  . LEFT HEART CATH AND CORONARY ANGIOGRAPHY Right 11/20/2017   Procedure: LEFT HEART CATH AND CORONARY ANGIOGRAPHY;  Surgeon: Dionisio David, MD;  Location: [place] [facility];  Service: Cardiovascular;  Laterality: Right;  . ROTATOR CUFF REPAIR  1991  . TEE WITHOUT CARDIOVERSION N/A 11/03/2017   Procedure: TRANSESOPHAGEAL ECHOCARDIOGRAM (TEE);  Surgeon: Grace Isaac, MD;  Location: [place];  Service: Open Heart Surgery;  Laterality: N/A;    Prior to Admission medications   Medication Sig Start Date End Date Taking? Authorizing Provider  aspirin EC 325 MG EC tablet Take 1 tablet (325 mg total) by mouth daily. 11/08/17   Elgie Collard, PA-C  atorvastatin (LIPITOR) 40 MG tablet Take 1 tablet (40 mg total) by mouth daily. 11/08/17   Elgie Collard, PA-C  isosorbide mononitrate (IMDUR) 30 MG 24 hr tablet Take 1 tablet (30 mg total) by mouth daily. 11/20/17   Henreitta Leber, MD  promethazine (PHENERGAN) 12.5 MG tablet Take 1 tablet (12.5 mg total) by mouth every 6 (six) hours as needed for nausea or vomiting. 07/12/18   Gregor Hams, MD  ranitidine (ZANTAC) 150 MG tablet Take 1 tablet (150 mg total) by mouth at bedtime. 07/28/18 07/28/19  Hinda Kehr, MD  simethicone (MYLICON) 80 MG chewable tablet Chew 1 tablet (80 mg total) by mouth 4 (four) times daily as needed for flatulence. 11/08/17   Elgie Collard, PA-C  traMADol (ULTRAM) 50 MG tablet Take 1 tablet (50 mg total) by mouth every 6 (six) hours as needed. Patient not taking: Reported on 07/31/2018 04/03/18 04/03/19  Merlyn Lot, MD  triamcinolone ointment (KENALOG) 0.5 % Apply 1 application topically 2 (two) times daily. Patient not taking: Reported on 07/31/2018 05/25/18    Rudene Re, MD    Allergies Bee venom; Codeine; and Sulfa antibiotics  Family History  Problem Relation Age of Onset  . Breast cancer Mother   . Huntington's disease Father   . Cancer Paternal Uncle        Colon  . Diabetes Son   . Cancer Maternal Grandfather   . Cancer Paternal Grandmother   . Heart disease Paternal Grandmother   . Huntington's disease Paternal Grandmother     Social History Social History   Tobacco Use  . Smoking status: Former Smoker    Packs/day: 0.50    Years: 20.00    Pack years: 10.00    Types: Cigarettes    Last attempt to quit: 06/16/2017    Years since quitting: 1.1  . Smokeless tobacco: Never Used  Substance Use Topics  . Alcohol use: No    Alcohol/week: 0.0 standard drinks  . Drug use: No    Review  of Systems Level 5 caveat:  history/ROS limited by chronic neurological/psychiatric illness (Huntington's disease).  Reports chest pain, anxiety attack, recent fall, and feeling hungry.  ____________________________________________   PHYSICAL EXAM:  VITAL SIGNS: ED Triage Vitals  Enc Vitals Group     BP 08/08/18 0016 (!) 129/96     Pulse Rate 08/08/18 0016 (!) 59     Resp 08/08/18 0016 18     Temp 08/08/18 0016 (!) 97.4 F (36.3 C)     Temp Source 08/08/18 0016 Oral     SpO2 08/08/18 0016 94 %     Weight 08/08/18 0017 47.6 kg (105 lb)     Height 08/08/18 0017 1.778 m (5\' 10" )     Head Circumference --      Peak Flow --      Pain Score 08/08/18 0016 5     Pain Loc --      Pain Edu? --      Excl. in [place]? --     Constitutional: Alert with unusual mannerisms and behaviors but at baseline according to aunt and uncle.  Cachectic. Eyes: Conjunctivae are normal.  Head: Atraumatic. Nose: No congestion/rhinnorhea. Mouth/Throat: Mucous membranes are moist. Neck: No stridor.  No meningeal signs.   Cardiovascular: Normal rate, regular rhythm. Good peripheral circulation. Grossly normal heart sounds. Respiratory: Normal  respiratory effort.  No retractions. Lungs CTAB. Gastrointestinal: Cachectic.  Soft and nontender. No distention.  Musculoskeletal: No lower extremity tenderness nor edema. No gross deformities of extremities. Neurologic:  Normal speech and language. No gross focal neurologic deficits are appreciated.  Some jerking and rapid movements. Skin:  Skin is warm, dry and intact. No rash noted. Psychiatric: Mood and affect are somewhat bizarre but consistent with a history of Huntington's disease.  No warning signs are present at this time.  ____________________________________________   LABS (all labs ordered are listed, but only abnormal results are displayed)  Labs Reviewed  COMPREHENSIVE METABOLIC PANEL - Abnormal; Notable for the following components:      Result Value   Glucose, Bld 105 (*)    BUN 36 (*)    Total Protein 8.3 (*)    AST 68 (*)    ALT 103 (*)    Total Bilirubin 1.3 (*)    All other components within normal limits  CBC WITH DIFFERENTIAL/PLATELET - Abnormal; Notable for the following components:   RBC 6.10 (*)    HCT 52.6 (*)    RDW 15.1 (*)    Lymphs Abs 0.8 (*)    All other components within normal limits  TROPONIN I  LIPASE, BLOOD   ____________________________________________  EKG  ED ECG REPORT I, Hinda Kehr, the attending physician, personally viewed and interpreted this ECG.  Date: 08/08/2018 EKG Time: 00: 14 Rate: 63 Rhythm: normal sinus rhythm QRS Axis: normal Intervals: normal ST/T Wave abnormalities: Non-specific ST segment / T-wave changes, but no evidence of acute ischemia.  The T wave abnormalities are primarily in leads V2 and V3 and it actually looks considerably better on this EKG than it has on prior EKGs. Narrative Interpretation: no evidence of acute ischemia   ____________________________________________  RADIOLOGY I, Hinda Kehr, personally viewed and evaluated these images (plain radiographs) as part of my medical decision  making, as well as reviewing the written report by the radiologist.  ED MD interpretation: No evidence of acute abnormality on the chest x-ray.  Official radiology report(s): Dg Chest Portable 1 View  Result Date: 08/08/2018 CLINICAL DATA:  Chest pain. EXAM: PORTABLE  CHEST 1 VIEW COMPARISON:  June 23, 2018 FINDINGS: No pneumothorax. The cardiomediastinal silhouette is normal. There is hyperinflation of the lungs again identified. No suspicious nodules or masses. There is a healed right rib fracture. No other acute abnormalities noted. IMPRESSION: Hyperinflation of the lungs. No emphysema seen on the recent CT of the chest from July 30, 2018. The findings could be due to an exuberant inspiratory effort or air trapping from COPD or asthma. Recommend clinical correlation. No other acute abnormalities. Electronically Signed   By: Dorise Bullion III M.D   On: 08/08/2018 00:54    ____________________________________________   PROCEDURES  Critical Care performed: No   Procedure(s) performed:   Procedures   ____________________________________________   INITIAL IMPRESSION / ASSESSMENT AND PLAN / ED COURSE  As part of my medical decision making, I reviewed the following data within the [place] History obtained from family, Nursing notes reviewed and incorporated, Labs reviewed , EKG interpreted , Old EKG reviewed, Old chart reviewed, Radiograph reviewed  and Notes from prior ED visits  Differential diagnosis includes, but is not limited to, anyone of the multitude of common causes of chest pain including musculoskeletal pain, anxiety/panic attacks, ACS, pneumothorax, pneumonia, traumatic injury.  The patient has had numerous recent emergency department visits and chest pain has been a complaint during at least some of them.  He currently does not appear to be in any distress and is eating and asking for more food.  Family is at the bedside and feel that he is at his  baseline.  I reported the reassuring results that his vital signs and lab work are all within normal limits, no evidence of ischemia on EKG, and normal chest x-ray.  His lung sounds are clear at this time even though the chest x-ray shows some hyperinflation I think that had to do with his inspiratory phase during the image.  There is no indication for repeat troponin at this time based on his low risk for this being ACS and having had repeat troponins in the past, all of which have been negative.  We give the patient Ativan 2 mg by mouth when he arrived and that did seem to help his situation overall.  I encourage close outpatient follow-up with his primary care doctor.  I gave my usual and customary return precautions.      ____________________________________________  FINAL CLINICAL IMPRESSION(S) / ED DIAGNOSES  Final diagnoses:  Chest pain, unspecified type  Huntington's disease ([place])     MEDICATIONS GIVEN DURING THIS VISIT:  Medications  LORazepam (ATIVAN) tablet 2 mg (2 mg Oral Given 08/08/18 0028)     ED Discharge Orders    None       Note:  This document was prepared using Dragon voice recognition software and may include unintentional dictation errors.    Hinda Kehr, MD 08/08/18 [id number]

## 2018-08-08 NOTE — ED Notes (Signed)
ED Provider at bedside. 

## 2018-08-08 NOTE — ED Triage Notes (Signed)
Patient presents to Emergency Department via EMS with complaints of chest pain, pt reports feels like "anxiety attack", left chest 5/10 press without radiation.  Denies accompanying s/sx.  Hx of Huntington's disease

## 2018-08-08 NOTE — Discharge Instructions (Addendum)

## 2018-11-13 IMAGING — CT CT ABD-PELV W/ CM
2 of 5 series · 15 of 46 positions shown, 17 images · IV contrast (APPLIED)
Comparison: 03/27/2018

CLINICAL DATA: 51 y.o. male with a past medical history of gastric
reflux, hypertension, and STEMI, presents to the emergency
department for chest pain and abdominal pain. According to the
patient for the past 1 month he has been experiencing pain across
the UPPER abdomen and LOWER chest. Two days of nausea, vomiting.

EXAM:
CT ABDOMEN AND PELVIS WITH CONTRAST
TECHNIQUE: Multidetector CT imaging of the abdomen and pelvis was performed
using the standard protocol following bolus administration of
intravenous contrast.
CONTRAST:  85mL G2RZRB-H88 IOPAMIDOL ([id number]-H88) INJECTION 61%

[Series 2: axial st · axial · 0.59mm/px · z∈[-783,-418]mm · 12 of 83 slices shown, 14 images]
[im 5/83  soft-tissue]
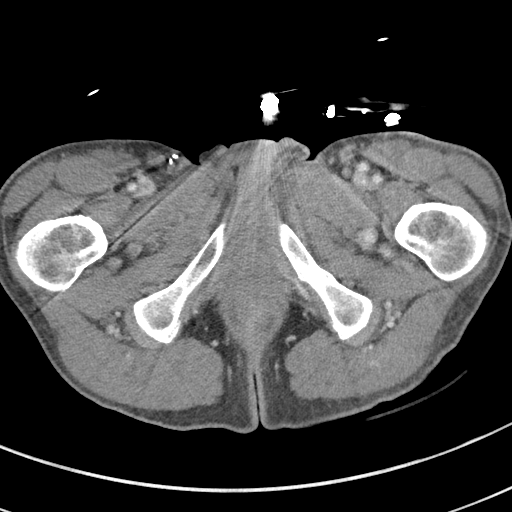
[im 5/83  bone]
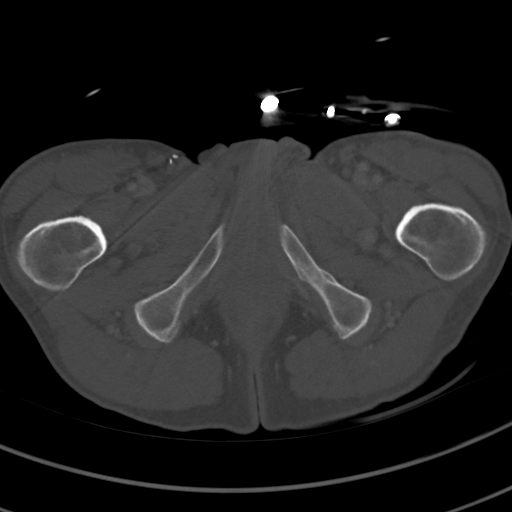
[im 13/83  soft-tissue]
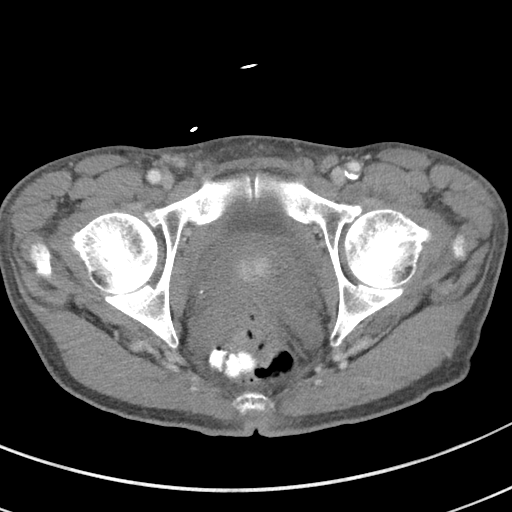
[im 17/83  soft-tissue]
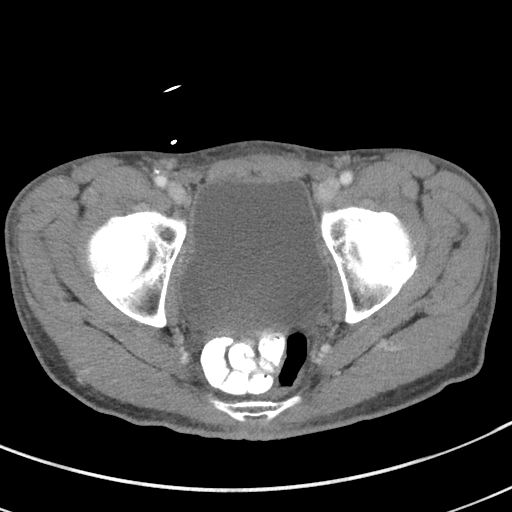
[im 25/83  soft-tissue]
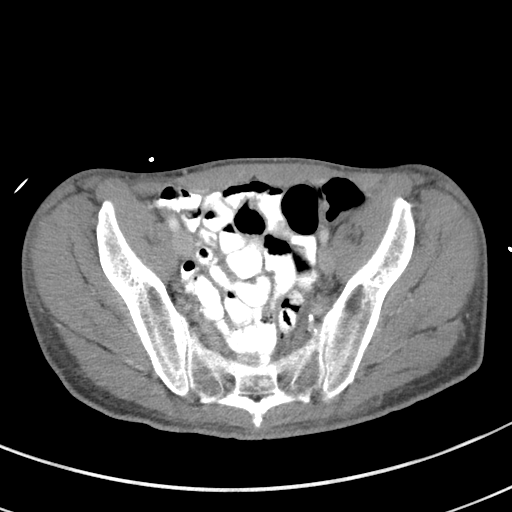
[im 33/83  soft-tissue]
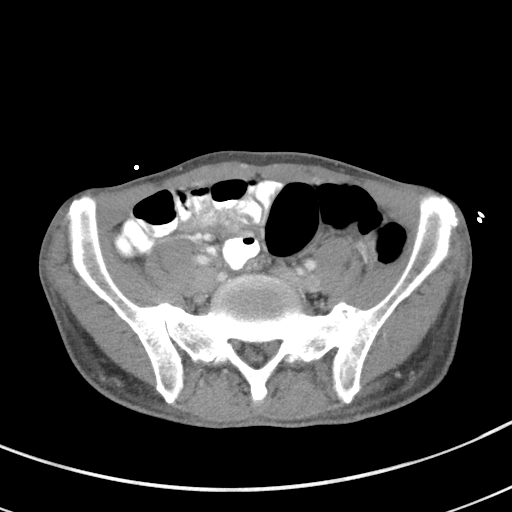
[im 37/83  soft-tissue]
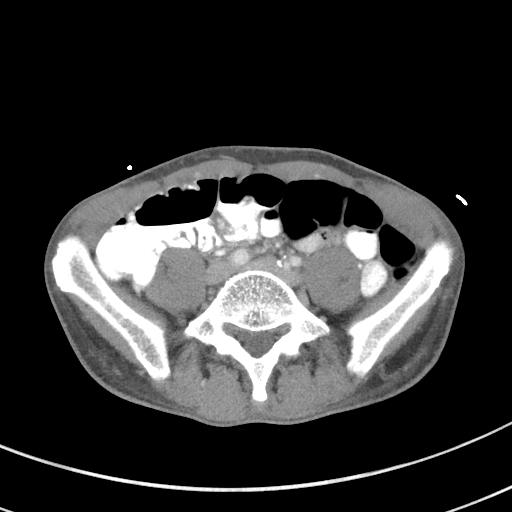
[im 46/83  soft-tissue]
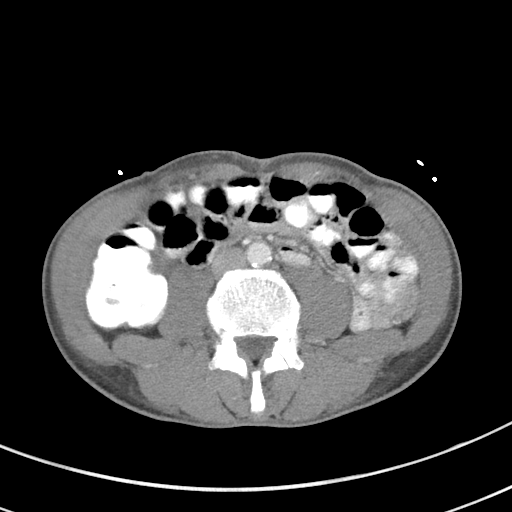
[im 50/83  soft-tissue]
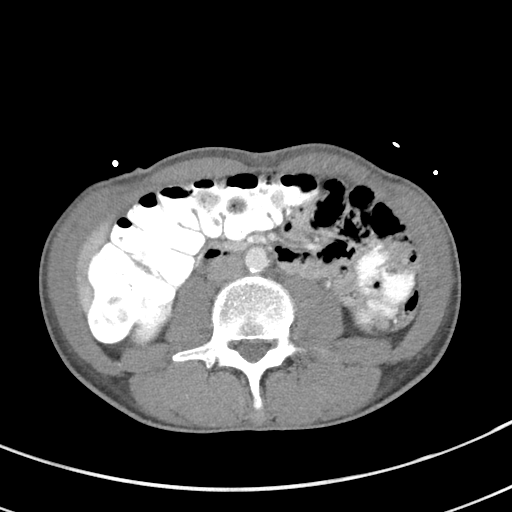
[im 58/83  soft-tissue]
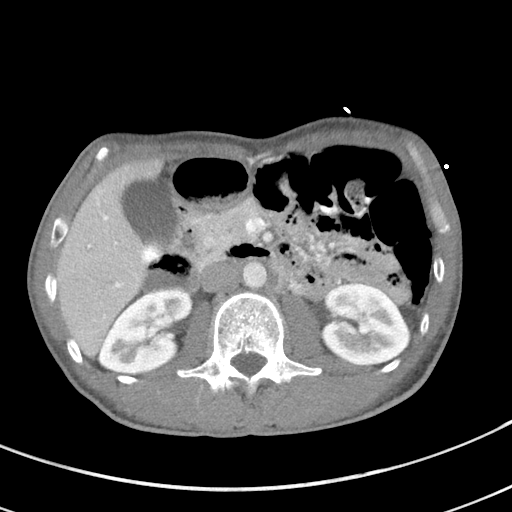
[im 58/83  bone]
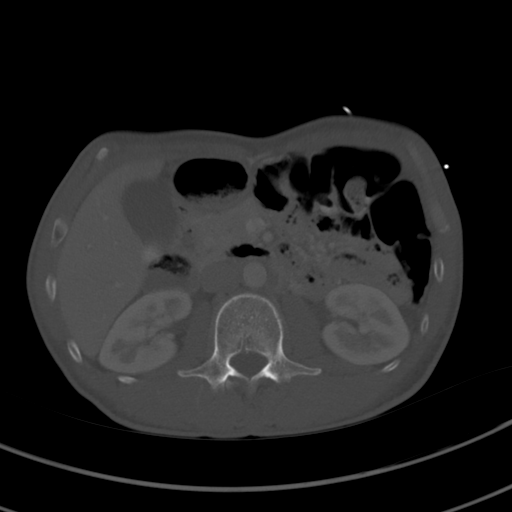
[im 66/83  soft-tissue]
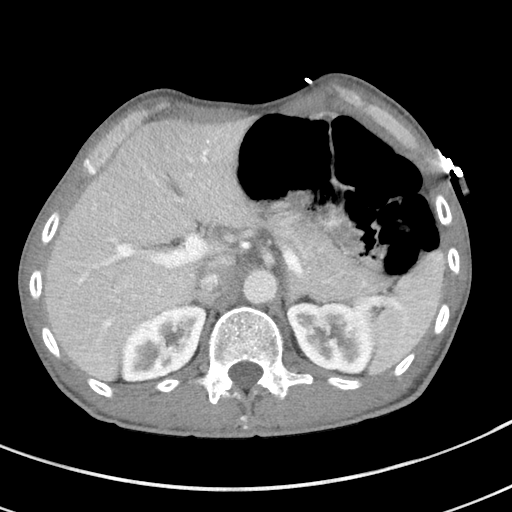
[im 70/83  soft-tissue]
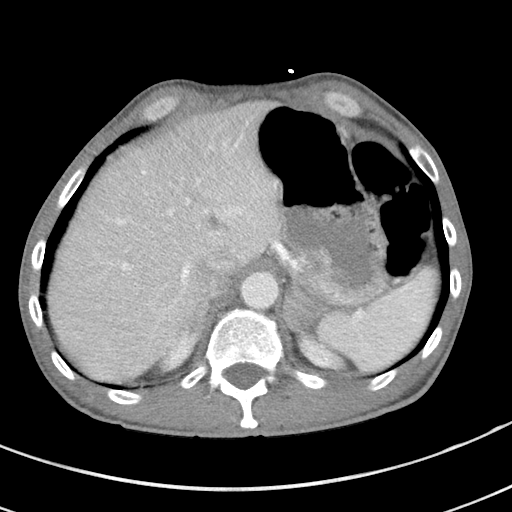
[im 78/83  soft-tissue]
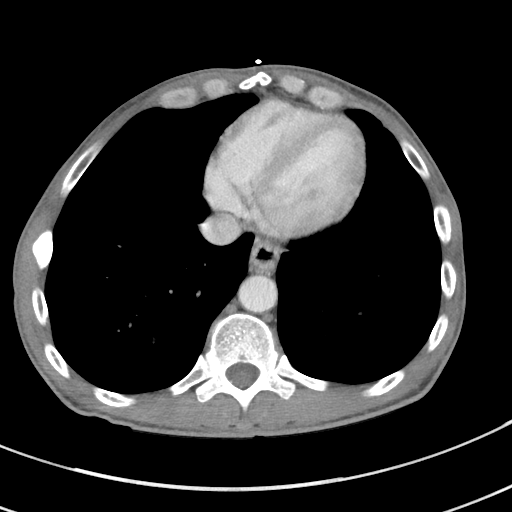

[Series 5: coronal st · coronal · 0.56mm/px · 3 of 74 slices shown]
[im 25/74  soft-tissue]
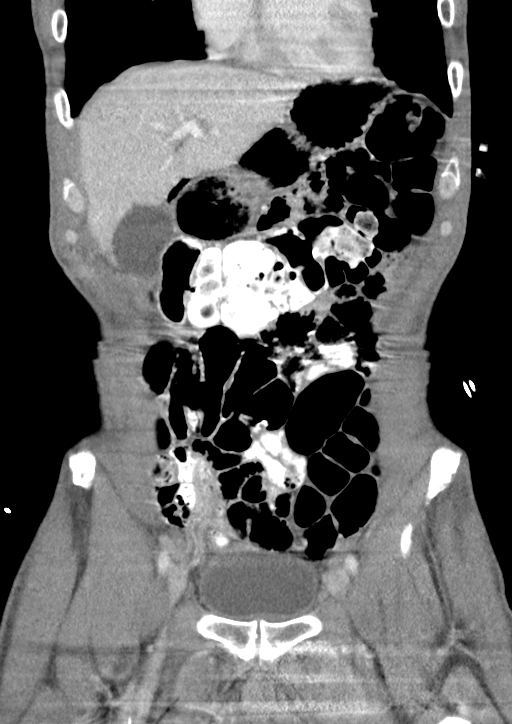
[im 33/74  soft-tissue]
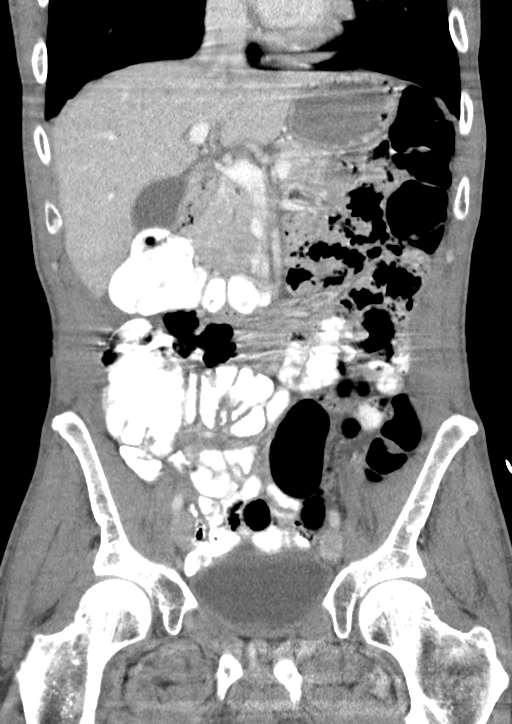
[im 41/74  soft-tissue]
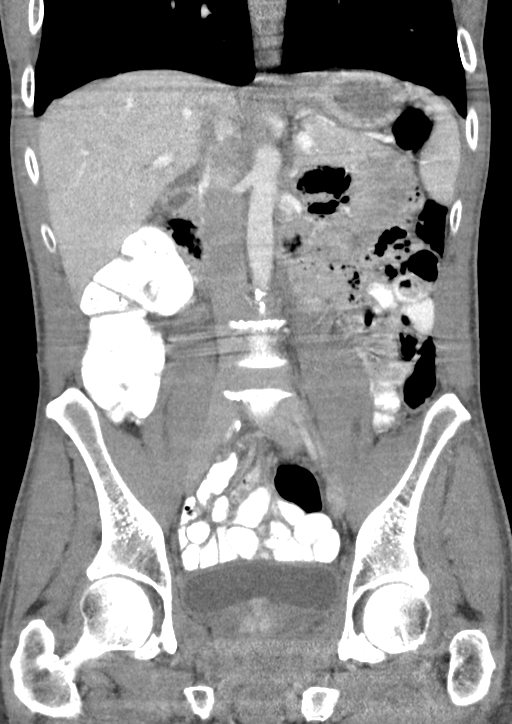

[15 of 46 positions shown; findings below may reference images not displayed]

FINDINGS: Lower chest: No acute abnormality.

Hepatobiliary: No focal liver abnormality is seen. No radiopaque
gallstones, biliary dilatation, or pericholecystic inflammatory
changes.

Pancreas: Unremarkable. No pancreatic ductal dilatation or
surrounding inflammatory changes.

Spleen: Normal in size without focal abnormality.

Adrenals/Urinary Tract: Adrenal glands are unremarkable. Kidneys are
normal, without renal calculi, focal lesion, or hydronephrosis.
Bladder is unremarkable.

Stomach/Bowel: Stomach and small bowel loops are normal in
appearance. Appendix is not seen. Loops of colon are normal in
caliber.

Vascular/Lymphatic: There is atherosclerotic calcification of the
abdominal aorta. No aneurysm. Although involved by atherosclerosis,
there is vascular opacification of the celiac axis, superior
mesenteric artery, and inferior mesenteric artery. Normal appearance
of the portal venous system and inferior vena cava. No
retroperitoneal or mesenteric adenopathy.

Reproductive: Prostate is enlarged.

Other: Anterior abdominal wall is unremarkable. No free pelvic
fluid.

Musculoskeletal: No acute or significant osseous findings.
IMPRESSION: 1.  No evidence for acute  abnormality.
2.  Aortic atherosclerosis.  ([id number])
3. Resolution of anterior abdominal wall inflammatory changes.
